# Patient Record
Sex: Female | Born: 1970 | Race: White | Hispanic: No | Marital: Single | Smoking: Current every day smoker
Health system: Southern US, Community
[De-identification: ages and names within clinical notes are randomized; demographics above are authoritative.]

## PROBLEM LIST (undated history)

## (undated) DIAGNOSIS — I10 Essential (primary) hypertension: Secondary | ICD-10-CM

## (undated) HISTORY — DX: Essential (primary) hypertension: I10

---

## 1992-07-17 HISTORY — PX: HAND SURGERY: SHX662

## 1996-07-17 HISTORY — PX: OTHER SURGICAL HISTORY: SHX169

## 2002-07-17 HISTORY — PX: CHOLECYSTECTOMY: SHX55

## 2002-07-17 HISTORY — PX: APPENDECTOMY: SHX54

## 2013-12-10 ENCOUNTER — Encounter: Payer: Self-pay | Admitting: Family Medicine

## 2013-12-10 ENCOUNTER — Ambulatory Visit (INDEPENDENT_AMBULATORY_CARE_PROVIDER_SITE_OTHER): Payer: Self-pay | Admitting: Family Medicine

## 2013-12-10 VITALS — BP 86/61 | HR 96 | Ht 67.0 in | Wt 255.0 lb

## 2013-12-10 DIAGNOSIS — F411 Generalized anxiety disorder: Secondary | ICD-10-CM

## 2013-12-10 DIAGNOSIS — F419 Anxiety disorder, unspecified: Secondary | ICD-10-CM

## 2013-12-10 DIAGNOSIS — F329 Major depressive disorder, single episode, unspecified: Secondary | ICD-10-CM

## 2013-12-10 DIAGNOSIS — G43909 Migraine, unspecified, not intractable, without status migrainosus: Secondary | ICD-10-CM

## 2013-12-10 DIAGNOSIS — Z124 Encounter for screening for malignant neoplasm of cervix: Secondary | ICD-10-CM

## 2013-12-10 DIAGNOSIS — Z13220 Encounter for screening for lipoid disorders: Secondary | ICD-10-CM

## 2013-12-10 DIAGNOSIS — F32A Depression, unspecified: Secondary | ICD-10-CM | POA: Insufficient documentation

## 2013-12-10 DIAGNOSIS — F3289 Other specified depressive episodes: Secondary | ICD-10-CM

## 2013-12-10 MED ORDER — CLONAZEPAM 1 MG PO TABS: 1.0000 mg | ORAL_TABLET | Freq: Three times a day (TID) | ORAL | Status: DC | PRN

## 2013-12-10 MED ORDER — BUSPIRONE HCL 10 MG PO TABS: 10.0000 mg | ORAL_TABLET | Freq: Two times a day (BID) | ORAL | Status: DC

## 2013-12-10 MED ORDER — METOPROLOL TARTRATE 50 MG PO TABS: 50.0000 mg | ORAL_TABLET | Freq: Two times a day (BID) | ORAL | Status: DC

## 2013-12-10 MED ORDER — FLUOXETINE HCL 20 MG PO TABS: 20.0000 mg | ORAL_TABLET | Freq: Every day | ORAL | Status: DC

## 2013-12-10 NOTE — Progress Notes (Signed)
CC: Cynthia Murray is a 43 y.o. female is here for Establish Care and medication refills   Subjective: HPI:   Pleasant 43 year old here to establish care  Reports a history of migraine stemming back years currently well controlled almost completely absent while taking metoprolol 50 mg twice a day. She denies orthostatic like symptoms and does not check blood pressure at home. Denies any recent or remote motor or sensory disturbances.  Unknown triggers for migraines.  Reports a history of depression that stems back decades. Symptoms significantly declined around the time of her mother's death a few years ago.  Most recently she's been prescribed Prozac which she is taking 40 mg in the morning and 20 mg in the evening. Since being on this regimen she reports occasional thoughts of pessimism, and subjective depression however overall symptoms are much better controlled in satisfactory her mind. She denies recent or remote paranoia, nor thoughts wanting to harm herself or others.  Reports a history of long-standing anxiety stemming back decades. She's been using a combination of BuSpar and clonazepam and symptoms were well-controlled until she ran out of clonazepam a few months ago. Medication regimen is limited by lack of insurance. Anxiety is worsened by family responsibilities, caring for her nieces, and when in crowds.  Symptoms have been severe enough where she   rarely feels comfortable leaving the house even for responsibilities such as picking up her family members.  She tells me she has a history of hyperlipidemia was one time on gemfibrozil however cannot afford the medications at a stop taking  She tells me her last Pap smear was over 15 years ago no history of abnormals.  Review of Systems - General ROS: negative for - chills, fever, night sweats, weight gain or weight loss Ophthalmic ROS: negative for - decreased vision Psychological ROS: negative for - hallucinations or paranoia ENT  ROS: negative for - hearing change, nasal congestion, tinnitus or allergies Hematological and Lymphatic ROS: negative for - bleeding problems, bruising or swollen lymph nodes Breast ROS: negative Respiratory ROS: no cough, shortness of breath, or wheezing Cardiovascular ROS: no chest pain or dyspnea on exertion Gastrointestinal ROS: no abdominal pain, change in bowel habits, or black or bloody stools Genito-Urinary ROS: negative for - genital discharge, genital ulcers, incontinence or abnormal bleeding from genitals Musculoskeletal ROS: negative for - joint pain or muscle pain Neurological ROS: negative for - headaches or memory loss Dermatological ROS: negative for lumps, mole changes, rash and skin lesion changes  Past Medical History  Diagnosis Date  . Hypertension     Past Surgical History  Procedure Laterality Date  . Hand surgery  1994    left hand  . Removal of cyst between lungs  1998  . Appendectomy  2004  . Cholecystectomy  2004   Family History  Problem Relation Age of Onset  . Breast cancer      grandmother  . Heart attack Father   . Depression Mother     father  . Diabetes Father   . Hyperlipidemia Father   . Hypertension Father     History   Social History  . Marital Status: Single    Spouse Name: N/A    Number of Children: N/A  . Years of Education: N/A   Occupational History  . Not on file.   Social History Main Topics  . Smoking status: Current Every Day Smoker -- 1.00 packs/day for 15 years    Types: Cigarettes  . Smokeless tobacco: Not on  file  . Alcohol Use: Yes  . Drug Use: No  . Sexual Activity: Not Currently   Other Topics Concern  . Not on file   Social History Narrative  . No narrative on file     Objective: BP 86/61  Pulse 96  Ht 5' 7"  (1.702 m)  Wt 255 lb (115.667 kg)  BMI 39.93 kg/m2  General: Alert and Oriented, No Acute Distress HEENT: Pupils equal, round, reactive to light. Conjunctivae clear.  Moist membranes there is  unremarkable Lungs: Clear to auscultation bilaterally, no wheezing/ronchi/rales.  Comfortable work of breathing. Good air movement. Cardiac: Regular rate and rhythm. Normal S1/S2.  No murmurs, rubs, nor gallops.  No carotid bruits Abdomen:  Obese Extremities: No peripheral edema.  Strong peripheral pulses.  Mental Status: No depression, anxiety, nor agitation. Skin: Warm and dry.  Assessment & Plan: Cynthia Murray was seen today for establish care and medication refills.  Diagnoses and associated orders for this visit:  Anxiety - busPIRone (BUSPAR) 10 MG tablet; Take 1 tablet (10 mg total) by mouth 2 (two) times daily. - clonazePAM (KLONOPIN) 1 MG tablet; Take 1 tablet (1 mg total) by mouth 3 (three) times daily as needed for anxiety.  Depression - FLUoxetine (PROZAC) 20 MG tablet; Take 1 tablet (20 mg total) by mouth daily. Take two in the morning and one in the evening  Migraine - metoprolol (LOPRESSOR) 50 MG tablet; Take 1 tablet (50 mg total) by mouth 2 (two) times daily.  Lipid screening  Screening for cervical cancer     anxiety: Uncontrolled continue BuSpar restart clonazepam Depression: Controlled continue Prozac Migraine: Controlled continue metoprolol Lipid screen: I've advised her to do routine lipid screening to check triglycerides and LDL, she politely declines Screen for cervical cancer: Counseled her that she is due for a Pap smear and we can do that in our office at her convenience encouraged her to schedule an appointment for this at her convenience  Return in about 6 months (around 06/12/2014).

## 2013-12-11 ENCOUNTER — Encounter: Payer: Self-pay | Admitting: Family Medicine

## 2013-12-11 DIAGNOSIS — I10 Essential (primary) hypertension: Secondary | ICD-10-CM | POA: Insufficient documentation

## 2013-12-11 DIAGNOSIS — G8929 Other chronic pain: Secondary | ICD-10-CM | POA: Insufficient documentation

## 2014-04-16 ENCOUNTER — Other Ambulatory Visit: Payer: Self-pay | Admitting: Family Medicine

## 2014-05-14 ENCOUNTER — Encounter: Payer: Self-pay | Admitting: Sports Medicine

## 2014-05-14 ENCOUNTER — Ambulatory Visit (INDEPENDENT_AMBULATORY_CARE_PROVIDER_SITE_OTHER): Payer: Self-pay | Admitting: Sports Medicine

## 2014-05-14 ENCOUNTER — Ambulatory Visit (INDEPENDENT_AMBULATORY_CARE_PROVIDER_SITE_OTHER): Payer: Self-pay

## 2014-05-14 VITALS — BP 106/61 | HR 90 | Wt 255.0 lb

## 2014-05-14 DIAGNOSIS — M25571 Pain in right ankle and joints of right foot: Secondary | ICD-10-CM

## 2014-05-14 DIAGNOSIS — F419 Anxiety disorder, unspecified: Secondary | ICD-10-CM

## 2014-05-14 DIAGNOSIS — M5416 Radiculopathy, lumbar region: Secondary | ICD-10-CM | POA: Insufficient documentation

## 2014-05-14 DIAGNOSIS — M25471 Effusion, right ankle: Secondary | ICD-10-CM

## 2014-05-14 NOTE — Progress Notes (Signed)
   Subjective:    I'm seeing this patient as a consultation for:  Dr. Ileene Rubens  CC: Multiple complaints  HPI: This is a 43 year old female with a history of anxiety and depression who comes in with a long history of pain in her low back, worse with flexion, Valsalva, sitting, radiating down the left leg imprecise in S1 distribution. She took some steroids already, and tells me she has been doing home rehabilitation exercises without any improvement moderate, persistent, no bowel or bladder dysfunction, saddle numbness, or new constitutional symptoms.  Right ankle pain: Occurred after an inversion injury, persistent pain, it is localized behind the medial malleolus, burning, radiates to the navicular.  Anxiety and depression: Allowed herself to run out of alprazolam, is requesting a refill from me.  Past medical history, Surgical history, Family history not pertinant except as noted below, Social history, Allergies, and medications have been entered into the medical record, reviewed, and no changes needed.   Review of Systems: No headache, visual changes, nausea, vomiting, diarrhea, constipation, dizziness, abdominal pain, skin rash, fevers, chills, night sweats, weight loss, swollen lymph nodes, body aches, joint swelling, muscle aches, chest pain, shortness of breath, mood changes, visual or auditory hallucinations.   Objective:   General: Well Developed, well nourished, and in no acute distress.  Neuro/Psych: Alert and oriented x3, extra-ocular muscles intact, able to move all 4 extremities, sensation grossly intact. Skin: Warm and dry, no rashes noted.  Respiratory: Not using accessory muscles, speaking in full sentences, trachea midline.  Cardiovascular: Pulses palpable, no extremity edema. Abdomen: Does not appear distended. Back Exam:  Inspection: Unremarkable  Motion: Flexion 45 deg, Extension 45 deg, Side Bending to 45 deg bilaterally,  Rotation to 45 deg bilaterally  SLR laying:  Negative  XSLR laying: Negative  Palpable tenderness: None. FABER: negative. Sensory change: Gross sensation intact to all lumbar and sacral dermatomes.  Reflexes: 2+ at both patellar tendons, 2+ at achilles tendons, Babinski's downgoing.  Strength at foot  Plantar-flexion: 5/5 Dorsi-flexion: 5/5 Eversion: 5/5 Inversion: 5/5  Leg strength  Quad: 5/5 Hamstring: 5/5 Hip flexor: 5/5 Hip abductors: 5/5  Gait unremarkable. Right Ankle: No visible erythema or swelling. Range of motion is full in all directions. Strength is 5/5 in all directions. Stable lateral and medial ligaments; squeeze test and kleiger test unremarkable; Talar dome nontender; No pain at base of 5th MT; No tenderness over cuboid; Tender to palpation behind the medial malleolus with recurrence of pain with resisted inversion of the foot, tender to palpation over the navicular. No sign of peroneal tendon subluxations; Negative tarsal tunnel tinel's Able to walk 4 steps.  Impression and Recommendations:   This case required medical decision making of moderate complexity.

## 2014-05-14 NOTE — Assessment & Plan Note (Signed)
Does see Dr. Selinda Orion with pain management. Has already been through steroids and home rehabilitation exercises, x-rays have been done in the past. MRI for interventional injection planning. Return to go over MRI results.

## 2014-05-14 NOTE — Assessment & Plan Note (Signed)
After an injury in the distant past. Stirrup brace. Formal physical therapy. X-rays. Return for custom orthotics. Encouraged use of shoes rather than walking barefoot.

## 2014-05-14 NOTE — Assessment & Plan Note (Signed)
Patient will need to follow-up with PCP regarding psychotropic medications.

## 2014-05-19 ENCOUNTER — Ambulatory Visit: Payer: Self-pay | Admitting: Family Medicine

## 2014-05-19 DIAGNOSIS — Z0289 Encounter for other administrative examinations: Secondary | ICD-10-CM

## 2014-05-26 ENCOUNTER — Other Ambulatory Visit: Payer: Self-pay | Admitting: Family Medicine

## 2014-06-25 ENCOUNTER — Ambulatory Visit: Payer: Self-pay | Admitting: Family Medicine

## 2014-06-26 ENCOUNTER — Telehealth: Payer: Self-pay | Admitting: *Deleted

## 2014-06-26 DIAGNOSIS — F32A Depression, unspecified: Secondary | ICD-10-CM

## 2014-06-26 DIAGNOSIS — F329 Major depressive disorder, single episode, unspecified: Secondary | ICD-10-CM

## 2014-06-26 MED ORDER — FLUOXETINE HCL 20 MG PO TABS: ORAL_TABLET | ORAL | Status: DC

## 2014-06-26 MED ORDER — BUSPIRONE HCL 10 MG PO TABS: ORAL_TABLET | ORAL | Status: DC

## 2014-06-26 MED ORDER — CLONAZEPAM 1 MG PO TABS: 1.0000 mg | ORAL_TABLET | Freq: Three times a day (TID) | ORAL | Status: DC | PRN

## 2014-06-26 NOTE — Telephone Encounter (Signed)
Pt called and states she had an appointment scheduled the other day but didn't show up because she states she hurt her back and couldn't walk. Pt asked if she could have refills on her medications for one more month. Will refill ONCE more but needs appt in the future.

## 2014-08-03 ENCOUNTER — Other Ambulatory Visit: Payer: Self-pay | Admitting: Family Medicine

## 2014-08-06 ENCOUNTER — Ambulatory Visit: Payer: Self-pay | Admitting: Family Medicine

## 2014-08-11 ENCOUNTER — Ambulatory Visit: Payer: Self-pay | Admitting: Family Medicine

## 2014-08-14 ENCOUNTER — Ambulatory Visit: Payer: Self-pay | Admitting: Family Medicine

## 2014-08-17 ENCOUNTER — Ambulatory Visit: Payer: Self-pay | Admitting: Family Medicine

## 2014-08-17 DIAGNOSIS — Z0289 Encounter for other administrative examinations: Secondary | ICD-10-CM

## 2014-08-25 ENCOUNTER — Encounter: Payer: Self-pay | Admitting: Family Medicine

## 2014-08-25 ENCOUNTER — Ambulatory Visit (INDEPENDENT_AMBULATORY_CARE_PROVIDER_SITE_OTHER): Payer: Self-pay | Admitting: Family Medicine

## 2014-08-25 VITALS — BP 117/78 | HR 89 | Wt 255.0 lb

## 2014-08-25 DIAGNOSIS — F32A Depression, unspecified: Secondary | ICD-10-CM

## 2014-08-25 DIAGNOSIS — F419 Anxiety disorder, unspecified: Secondary | ICD-10-CM

## 2014-08-25 DIAGNOSIS — G43809 Other migraine, not intractable, without status migrainosus: Secondary | ICD-10-CM

## 2014-08-25 DIAGNOSIS — I10 Essential (primary) hypertension: Secondary | ICD-10-CM

## 2014-08-25 DIAGNOSIS — F329 Major depressive disorder, single episode, unspecified: Secondary | ICD-10-CM

## 2014-08-25 MED ORDER — FLUOXETINE HCL 20 MG PO TABS: ORAL_TABLET | ORAL | Status: DC

## 2014-08-25 MED ORDER — METOPROLOL TARTRATE 25 MG PO TABS: 50.0000 mg | ORAL_TABLET | Freq: Two times a day (BID) | ORAL | Status: DC

## 2014-08-25 MED ORDER — CLONAZEPAM 1 MG PO TABS: 1.0000 mg | ORAL_TABLET | Freq: Three times a day (TID) | ORAL | Status: DC | PRN

## 2014-08-25 MED ORDER — BUSPIRONE HCL 10 MG PO TABS: ORAL_TABLET | ORAL | Status: DC

## 2014-08-25 NOTE — Progress Notes (Signed)
CC: Cynthia Murray is a 44 y.o. female is here for f/u meds   Subjective: HPI:   follow-up essential hypertension:  Ran out of metoprolol a little less than a month ago. No outside blood pressures to report. Denies chest pain shortness of breath orthopnea nor peripheral edema  Follow-up anxiety: Has run out of BuSpar and clonazepam. Continues to take Prozac 40 mg in the morning and 20 in the evenings. She's noticed worsening of her anxiety since running out of the medications above. Symptoms were well controlled prior to running out of the medication. Symptoms are bad enough to where she still extremely anxious about leaving her house. She denies any other nervousness.   follow-up depression:  She believes that subjective depression has been worsening since running out of BuSpar and clonazepam, she thinks maybe the anxiety is contributing to her depression. She denies thoughts  Of wanting to harm herself or others. She feels more gloomy than what she is used to.  No report of sleep disturbance or change in appetite.  Follow-up migraines: Since running out of  Toprol she's had a return of her right-sided migraines. She tells me that  Migraines were absent when taking half a dose of metoprolol twice a day. Denies any new motor or sensory disturbances  Review Of Systems Outlined In HPI  Past Medical History  Diagnosis Date  . Hypertension     Past Surgical History  Procedure Laterality Date  . Hand surgery  1994    left hand  . Removal of cyst between lungs  1998  . Appendectomy  2004  . Cholecystectomy  2004   Family History  Problem Relation Age of Onset  . Breast cancer      grandmother  . Heart attack Father   . Depression Mother     father  . Diabetes Father   . Hyperlipidemia Father   . Hypertension Father     History   Social History  . Marital Status: Single    Spouse Name: N/A    Number of Children: N/A  . Years of Education: N/A   Occupational History  . Not on  file.   Social History Main Topics  . Smoking status: Current Every Day Smoker -- 1.00 packs/day for 15 years    Types: Cigarettes  . Smokeless tobacco: Not on file  . Alcohol Use: Yes  . Drug Use: No  . Sexual Activity: Not Currently   Other Topics Concern  . Not on file   Social History Narrative     Objective: BP 117/78 mmHg  Pulse 89  Wt 255 lb (115.667 kg)  General: Alert and Oriented, No Acute Distress HEENT: Pupils equal, round, reactive to light. Conjunctivae clear.   Moist mucous membranes Lungs: Clear to auscultation bilaterally, no wheezing/ronchi/rales.  Comfortable work of breathing. Good air movement. Cardiac: Regular rate and rhythm. Normal S1/S2.  No murmurs, rubs, nor gallops.   Extremities: No peripheral edema.  Strong peripheral pulses.  Mental Status:  Mild depression and anxiety. No agitation. Logical thought process Skin: Warm and dry.  Assessment & Plan: Cynthia Murray was seen today for f/u meds.  Diagnoses and associated orders for this visit:  Essential hypertension, benign  Anxiety - busPIRone (BUSPAR) 10 MG tablet; Take 1 tablet by mouth twice daily - clonazePAM (KLONOPIN) 1 MG tablet; Take 1 tablet (1 mg total) by mouth 3 (three) times daily as needed. for anxiety  Depression - Discontinue: FLUoxetine (PROZAC) 20 MG tablet; Take two tablets in  the morning and one tablet in the evening - FLUoxetine (PROZAC) 20 MG tablet; Take two tablets in the morning and two tablet in the evening  Other migraine without status migrainosus, not intractable - metoprolol (LOPRESSOR) 25 MG tablet; Take 2 tablets (50 mg total) by mouth 2 (two) times daily.     essential hypertension: currently not on any antihypertensives as appears to be controlled, refilling metoprolol only for the below reasons  Anxiety:  Worsened due to running out of clonazepam and BuSpar, refilling former regimen  Depression: Worsening chronic condition, increasing Prozac  For her now to take   40 mg twice a day  Migraines: uncontrolled wall metoprolol restarted metoprolol but at a lower dose given her normotensive blood pressures today  Return in about 3 months (around 11/23/2014).

## 2014-12-04 ENCOUNTER — Ambulatory Visit (INDEPENDENT_AMBULATORY_CARE_PROVIDER_SITE_OTHER): Payer: Self-pay | Admitting: Family Medicine

## 2014-12-04 ENCOUNTER — Encounter: Payer: Self-pay | Admitting: Family Medicine

## 2014-12-04 VITALS — BP 121/83 | HR 105 | Temp 97.6°F | Wt 277.0 lb

## 2014-12-04 DIAGNOSIS — R05 Cough: Secondary | ICD-10-CM

## 2014-12-04 DIAGNOSIS — R059 Cough, unspecified: Secondary | ICD-10-CM

## 2014-12-04 DIAGNOSIS — F329 Major depressive disorder, single episode, unspecified: Secondary | ICD-10-CM

## 2014-12-04 DIAGNOSIS — F32A Depression, unspecified: Secondary | ICD-10-CM

## 2014-12-04 DIAGNOSIS — F419 Anxiety disorder, unspecified: Secondary | ICD-10-CM

## 2014-12-04 DIAGNOSIS — N959 Unspecified menopausal and perimenopausal disorder: Secondary | ICD-10-CM

## 2014-12-04 MED ORDER — AZITHROMYCIN 250 MG PO TABS: ORAL_TABLET | ORAL | Status: AC

## 2014-12-04 MED ORDER — VENLAFAXINE HCL ER 75 MG PO CP24: 75.0000 mg | ORAL_CAPSULE | Freq: Every day | ORAL | Status: DC

## 2014-12-04 MED ORDER — CLONAZEPAM 1 MG PO TABS: 1.0000 mg | ORAL_TABLET | Freq: Three times a day (TID) | ORAL | Status: DC | PRN

## 2014-12-04 NOTE — Progress Notes (Signed)
CC: Cynthia Murray is a 44 y.o. female is here for Cough   Subjective: HPI:  Follow-up anxiety: Requesting refills on clonazepam. Continues on Prozac and BuSpar daily. She feels anxiety is currently controlled on her as needed use of clonazepam that she is taking 2-3 times a day. She has some difficulty leaving the house due to anxiety but it does not interfere with quality of life. She feels like her depression is worsening and around the time of her menstrual cycle she is getting crying spells for no particular reason. She cries over things that she recognizes shouldn't bother her whatsoever. No thoughts of wanting harm herself or others. She is also noticed that periods are less predictable.  Complains of cough that's been present on a daily basis for the last month. She is tried all over-the-counter cough medications that she has found that a local pharmacy without much benefit. She was also on a prednisone taper for left knee pain which did not help with cough whatsoever. She is occasionally hearing herself wheeze overall symptoms are moderate in severity. Present all hours of the day. No fevers, chills, night sweats, postnasal drip nor nasal congestion. Denies shortness of breath. Review of systems positive for increased frequency of migraines.    Review Of Systems Outlined In HPI  Past Medical History  Diagnosis Date  . Hypertension     Past Surgical History  Procedure Laterality Date  . Hand surgery  1994    left hand  . Removal of cyst between lungs  1998  . Appendectomy  2004  . Cholecystectomy  2004   Family History  Problem Relation Age of Onset  . Breast cancer      grandmother  . Heart attack Father   . Depression Mother     father  . Diabetes Father   . Hyperlipidemia Father   . Hypertension Father     History   Social History  . Marital Status: Single    Spouse Name: N/A  . Number of Children: N/A  . Years of Education: N/A   Occupational History  . Not on  file.   Social History Main Topics  . Smoking status: Current Every Day Smoker -- 1.00 packs/day for 15 years    Types: Cigarettes  . Smokeless tobacco: Not on file  . Alcohol Use: Yes  . Drug Use: No  . Sexual Activity: Not Currently   Other Topics Concern  . Not on file   Social History Narrative     Objective: BP 121/83 mmHg  Pulse 105  Temp(Src) 97.6 F (36.4 C) (Oral)  Wt 277 lb (125.646 kg)  SpO2 98%  General: Alert and Oriented, No Acute Distress HEENT: Pupils equal, round, reactive to light. Conjunctivae clear.  Moist mucous membranes Lungs: Clear to auscultation bilaterally, no wheezing/ronchi/rales.  Comfortable work of breathing. Good air movement. Cardiac: Regular rate and rhythm. Extremities: No peripheral edema.  Strong peripheral pulses.  Mental Status: No depression, anxiety, nor agitation. Skin: Warm and dry.  Assessment & Plan: Ronit was seen today for cough.  Diagnoses and all orders for this visit:  Anxiety Orders: -     clonazePAM (KLONOPIN) 1 MG tablet; Take 1 tablet (1 mg total) by mouth 3 (three) times daily as needed. for anxiety  Depression  Premenopausal patient Orders: -     venlafaxine XR (EFFEXOR XR) 75 MG 24 hr capsule; Take 1 capsule (75 mg total) by mouth daily with breakfast.  Cough Orders: -  azithromycin (ZITHROMAX) 250 MG tablet; Take two tabs at once on day 1, then one tab daily on days 2-5.   Anxiety: Controlled continue clonazepam, BuSpar and Prozac Depression: Uncontrolled, it sounds it is possible that she is also experiencing some postmenopausal symptoms therefore start Effexor which should help with headaches as well Cough: Suspect bacterial bronchitis therefore start azithromycin  Return in about 3 months (around 03/06/2015).

## 2014-12-15 ENCOUNTER — Encounter: Payer: Self-pay | Admitting: Family Medicine

## 2014-12-15 ENCOUNTER — Ambulatory Visit (INDEPENDENT_AMBULATORY_CARE_PROVIDER_SITE_OTHER): Payer: Self-pay | Admitting: Family Medicine

## 2014-12-15 VITALS — BP 129/79 | HR 98 | Wt 282.0 lb

## 2014-12-15 DIAGNOSIS — B9689 Other specified bacterial agents as the cause of diseases classified elsewhere: Secondary | ICD-10-CM

## 2014-12-15 DIAGNOSIS — R609 Edema, unspecified: Secondary | ICD-10-CM

## 2014-12-15 DIAGNOSIS — A499 Bacterial infection, unspecified: Secondary | ICD-10-CM

## 2014-12-15 DIAGNOSIS — J329 Chronic sinusitis, unspecified: Secondary | ICD-10-CM

## 2014-12-15 DIAGNOSIS — R0602 Shortness of breath: Secondary | ICD-10-CM

## 2014-12-15 MED ORDER — AMOXICILLIN-POT CLAVULANATE 500-125 MG PO TABS: ORAL_TABLET | ORAL | Status: AC

## 2014-12-15 NOTE — Progress Notes (Signed)
CC: Cynthia Murray is a 44 y.o. female is here for Leg Swelling   Subjective: HPI:   bilateral lower leg swelling it has been present for the past 2 or 3 weeks. It is symmetrical, improves with elevation of the legs. She's also tried up to 40 mg of Lasix  Without any benefit. She didn't even feel like she had a urinate more  After taking this dose.  She's never had this before. It's painful on the skin and tender to the touch where swelling is worse. Localized only to the legs.  Symptoms are mild to moderate in severity. Denies orthopnea nor PND.   Continues to have a cough is not improved since I saw her last. Azithromycin was no help. It's accompanied now with shortness of breath with easy activities such as unloading the dishwasher. Describes cough is nonproductive without blood in sputum. No wheezing. No fevers, chills, night sweats. Review of systems positive for pressure in the forehead and cheeks along with nasal congestion   Review Of Systems Outlined In HPI  Past Medical History  Diagnosis Date  . Hypertension     Past Surgical History  Procedure Laterality Date  . Hand surgery  1994    left hand  . Removal of cyst between lungs  1998  . Appendectomy  2004  . Cholecystectomy  2004   Family History  Problem Relation Age of Onset  . Breast cancer      grandmother  . Heart attack Father   . Depression Mother     father  . Diabetes Father   . Hyperlipidemia Father   . Hypertension Father     History   Social History  . Marital Status: Single    Spouse Name: N/A  . Number of Children: N/A  . Years of Education: N/A   Occupational History  . Not on file.   Social History Main Topics  . Smoking status: Current Every Day Smoker -- 1.00 packs/day for 15 years    Types: Cigarettes  . Smokeless tobacco: Not on file  . Alcohol Use: Yes  . Drug Use: No  . Sexual Activity: Not Currently   Other Topics Concern  . Not on file   Social History Narrative      Objective: BP 129/79 mmHg  Pulse 98  Wt 282 lb (127.914 kg)  General: Alert and Oriented, No Acute Distress HEENT: Pupils equal, round, reactive to light. Conjunctivae clear.   Moist mucous membranes Lungs: Clear to auscultation bilaterally, no wheezing/ronchi/rales.  Comfortable work of breathing. Good air movement. Frequent coughing Cardiac: Regular rate and rhythm. Normal S1/S2.  No murmurs, rubs, nor gallops.   / Extremities:  Trace pitting edema in the ankles bilaterally.  Strong peripheral pulses.  Mental Status: No depression, anxiety, nor agitation. Skin: Warm and dry.  Assessment & Plan: Cynthia Murray was seen today for leg swelling.  Diagnoses and all orders for this visit:  Shortness of breath Orders: -     Basic Metabolic Panel (BMET) -     B Nat Peptide  Edema Orders: -     Basic Metabolic Panel (BMET) -     B Nat Peptide  Bacterial sinusitis Orders: -     amoxicillin-clavulanate (AUGMENTIN) 500-125 MG per tablet; Take one by mouth every 8 hours for ten total days.    shortness of breath:  Rule out CHF  With  Beta natruretic peptide  Edema: Rule out renal insufficiency and heart failure  Spectral sinusitis: Start Augmentin  Ultimate plan will be based on the above results  Return if symptoms worsen or fail to improve.

## 2014-12-16 ENCOUNTER — Telehealth: Payer: Self-pay | Admitting: Family Medicine

## 2014-12-16 LAB — BASIC METABOLIC PANEL
BUN: 14 mg/dL (ref 6–23)
CALCIUM: 9.3 mg/dL (ref 8.4–10.5)
CO2: 27 mEq/L (ref 19–32)
Chloride: 104 mEq/L (ref 96–112)
Creat: 0.56 mg/dL (ref 0.50–1.10)
Glucose, Bld: 106 mg/dL — ABNORMAL HIGH (ref 70–99)
Potassium: 4.2 mEq/L (ref 3.5–5.3)
SODIUM: 141 meq/L (ref 135–145)

## 2014-12-16 LAB — BRAIN NATRIURETIC PEPTIDE: BRAIN NATRIURETIC PEPTIDE: 46.4 pg/mL (ref 0.0–100.0)

## 2014-12-16 MED ORDER — HYDROCHLOROTHIAZIDE 25 MG PO TABS: ORAL_TABLET | ORAL | Status: DC

## 2014-12-16 NOTE — Telephone Encounter (Signed)
Seth Bake, Will you please let patient know that her kidney function and heart failure test were both normal.  I'd like to offer her hydrochlorothiazide that I've sent to her [facility] to use on a PRN basis for leg swelling.

## 2014-12-16 NOTE — Telephone Encounter (Signed)
Message left on vm

## 2014-12-17 ENCOUNTER — Telehealth: Payer: Self-pay | Admitting: *Deleted

## 2014-12-17 NOTE — Telephone Encounter (Signed)
Pt left a vm late yesterday evening wanting to know if she could come off of the 2L fluid restriction now that her labs were fine & you gave her HCTZ.  Please advise.

## 2014-12-17 NOTE — Telephone Encounter (Signed)
LMOM notifying pt to stop the 2L fluid restriction.

## 2014-12-17 NOTE — Telephone Encounter (Signed)
closed

## 2014-12-17 NOTE — Telephone Encounter (Signed)
Yes that is okay, she may now ignore the 2 L fluid restriction.

## 2015-02-09 ENCOUNTER — Other Ambulatory Visit: Payer: Self-pay | Admitting: *Deleted

## 2015-02-09 ENCOUNTER — Telehealth: Payer: Self-pay | Admitting: *Deleted

## 2015-02-09 DIAGNOSIS — F32A Depression, unspecified: Secondary | ICD-10-CM

## 2015-02-09 DIAGNOSIS — F329 Major depressive disorder, single episode, unspecified: Secondary | ICD-10-CM

## 2015-02-09 MED ORDER — FLUOXETINE HCL 20 MG PO CAPS: ORAL_CAPSULE | ORAL | Status: DC

## 2015-02-09 NOTE — Telephone Encounter (Signed)
Pt left vm stating that her fluoxetine is considerably cheaper in capsule form than tablet.  I spoke with her pharmacy and they require a new rx be sent over stating capsules so they can dispense those at her next fill.  Is it ok for me to send this over? Please advise.

## 2015-02-09 NOTE — Telephone Encounter (Signed)
Done

## 2015-02-09 NOTE — Telephone Encounter (Signed)
Amber, Yes, can you please help with that change?

## 2015-02-10 ENCOUNTER — Encounter: Payer: Self-pay | Admitting: Family Medicine

## 2015-02-10 ENCOUNTER — Ambulatory Visit (INDEPENDENT_AMBULATORY_CARE_PROVIDER_SITE_OTHER): Payer: Self-pay

## 2015-02-10 ENCOUNTER — Ambulatory Visit (INDEPENDENT_AMBULATORY_CARE_PROVIDER_SITE_OTHER): Payer: Self-pay | Admitting: Family Medicine

## 2015-02-10 VITALS — BP 113/81 | HR 89 | Temp 98.4°F | Wt 266.0 lb

## 2015-02-10 DIAGNOSIS — R042 Hemoptysis: Secondary | ICD-10-CM

## 2015-02-10 DIAGNOSIS — R918 Other nonspecific abnormal finding of lung field: Secondary | ICD-10-CM

## 2015-02-10 MED ORDER — HYDROCODONE-HOMATROPINE 5-1.5 MG/5ML PO SYRP
5.0000 mL | ORAL_SOLUTION | Freq: Three times a day (TID) | ORAL | Status: DC | PRN
Start: 2015-02-10 — End: 2015-04-05

## 2015-02-10 MED ORDER — AZITHROMYCIN 250 MG PO TABS: ORAL_TABLET | ORAL | Status: AC

## 2015-02-10 NOTE — Progress Notes (Signed)
CC: Cynthia Murray is a 44 y.o. female is here for Cough   Subjective: HPI:  Complains of cough and shortness of breath. Symptoms have been present ever since she left the hospital for a microdiscectomy at the beginning of July. Symptoms are worse in the evenings. Cough is productive with pink sputum. She's been trying to suppress this with NyQuil without much benefit.  She feels fatigued. She denies fevers, chills, wheezing, or chest discomfort. She denies unintentional weight loss or known tuberculosis exposure. Symptoms overall are moderate in severity. She denies nasal congestion, postnasal drip or sore throat.    Review Of Systems Outlined In HPI  Past Medical History  Diagnosis Date  . Hypertension     Past Surgical History  Procedure Laterality Date  . Hand surgery  1994    left hand  . Removal of cyst between lungs  1998  . Appendectomy  2004  . Cholecystectomy  2004   Family History  Problem Relation Age of Onset  . Breast cancer      grandmother  . Heart attack Father   . Depression Mother     father  . Diabetes Father   . Hyperlipidemia Father   . Hypertension Father     History   Social History  . Marital Status: Single    Spouse Name: N/A  . Number of Children: N/A  . Years of Education: N/A   Occupational History  . Not on file.   Social History Main Topics  . Smoking status: Current Every Day Smoker -- 1.00 packs/day for 15 years    Types: Cigarettes  . Smokeless tobacco: Not on file  . Alcohol Use: Yes  . Drug Use: No  . Sexual Activity: Not Currently   Other Topics Concern  . Not on file   Social History Narrative     Objective: BP 113/81 mmHg  Pulse 89  Temp(Src) 98.4 F (36.9 C) (Oral)  Wt 266 lb (120.657 kg)  SpO2 93%  General: Alert and Oriented, No Acute Distress HEENT: Pupils equal, round, reactive to light. Conjunctivae clear.  Moist mucous membranes, pharynx Unremarkable Lungs: Comfortable work of breathing, speaking in  full sentences. No wheezing or rales. In the left upper posterior lung field. She coughed up sputum while we were talking that look purulent and had red streaks in it. Cardiac: Regular rate and rhythm. Normal S1/S2.  No murmurs, rubs, nor gallops.   Extremities: No peripheral edema.  Strong peripheral pulses.  Mental Status: No depression, anxiety, nor agitation. Skin: Warm and dry.  Assessment & Plan: Cynthia Murray was seen today for cough.  Diagnoses and all orders for this visit:  Hemoptysis Orders: -     HYDROcodone-homatropine (HYCODAN) 5-1.5 MG/5ML syrup; Take 5 mLs by mouth every 8 (eight) hours as needed for cough. -     azithromycin (ZITHROMAX) 250 MG tablet; Take two tabs at once on day 1, then one tab daily on days 2-5. -     DG Chest 2 View; Future   There is a great deal of noncontributory history provided by the patient regarding family dynamics at home, I had to remind her multiple times that she has to be scheduled for evaluation of cough.  Patient required frequent redirection especially once I noticed that her sputum had red streaks that look like blood in it. Start azithromycin for suspicion of walking pneumonia, check an x-ray to determine if she also needs to be on a beta-lactam antibiotic into rule out lung in  this heavy smoker.  Offer Tessalon Perles due to her request for something to help suppress the cough, she tells me that she was given this in the hospital and they were completely ineffective.  25 minutes spent face-to-face during visit today of which at least 50% was counseling or coordinating care regarding: 1. Hemoptysis    Ultimate plan will be based on the above results   Return if symptoms worsen or fail to improve.

## 2015-02-11 ENCOUNTER — Telehealth: Payer: Self-pay | Admitting: Family Medicine

## 2015-02-11 DIAGNOSIS — R918 Other nonspecific abnormal finding of lung field: Secondary | ICD-10-CM | POA: Insufficient documentation

## 2015-02-11 NOTE — Telephone Encounter (Signed)
Cynthia Murray, Will you please let patient know that her xray showed a shadow in her left lung that needs to be further evaluated with a CT scan of the chest to determine if it's pneumonia versus something more serious.  An order has been placed for this, please contact me tomorrow if she has not been contacted about scheduling by then.  Continue with taking azithromycin for now.

## 2015-02-11 NOTE — Telephone Encounter (Signed)
Complete message left on vm

## 2015-02-12 ENCOUNTER — Other Ambulatory Visit: Payer: Self-pay

## 2015-02-12 ENCOUNTER — Ambulatory Visit (INDEPENDENT_AMBULATORY_CARE_PROVIDER_SITE_OTHER): Payer: Self-pay

## 2015-02-12 ENCOUNTER — Telehealth: Payer: Self-pay | Admitting: Family Medicine

## 2015-02-12 DIAGNOSIS — R918 Other nonspecific abnormal finding of lung field: Secondary | ICD-10-CM

## 2015-02-12 MED ORDER — AMOXICILLIN 500 MG PO CAPS: 1000.0000 mg | ORAL_CAPSULE | Freq: Three times a day (TID) | ORAL | Status: DC

## 2015-02-12 MED ORDER — IOHEXOL 300 MG/ML  SOLN
75.0000 mL | Freq: Once | INTRAMUSCULAR | Status: AC | PRN
  Administered 2015-02-12: 75 mL via INTRAVENOUS

## 2015-02-12 NOTE — Telephone Encounter (Signed)
Pt.notified

## 2015-02-12 NOTE — Telephone Encounter (Signed)
Seth Bake, Will you please let patient know that the radiologist looking at her CT scan reports that her mass could possibly be just pneumonia therefore to fully cover her from an antibiotic standpoint I'd recommend she continue taking azithromycin and start a high dose regimen of amoxicillin that I've sent to her [facility].  I'd recommend a repeat CT scan in 6-8 weeks.

## 2015-03-09 ENCOUNTER — Other Ambulatory Visit: Payer: Self-pay | Admitting: Family Medicine

## 2015-03-12 ENCOUNTER — Other Ambulatory Visit: Payer: Self-pay | Admitting: *Deleted

## 2015-03-12 DIAGNOSIS — F419 Anxiety disorder, unspecified: Secondary | ICD-10-CM

## 2015-03-12 MED ORDER — CLONAZEPAM 1 MG PO TABS: 1.0000 mg | ORAL_TABLET | Freq: Three times a day (TID) | ORAL | Status: DC | PRN

## 2015-04-05 ENCOUNTER — Ambulatory Visit (INDEPENDENT_AMBULATORY_CARE_PROVIDER_SITE_OTHER): Payer: Self-pay | Admitting: Family Medicine

## 2015-04-05 ENCOUNTER — Ambulatory Visit: Payer: Self-pay

## 2015-04-05 ENCOUNTER — Other Ambulatory Visit: Payer: Self-pay | Admitting: Family Medicine

## 2015-04-05 ENCOUNTER — Encounter: Payer: Self-pay | Admitting: Family Medicine

## 2015-04-05 VITALS — BP 112/56 | HR 90 | Wt 263.0 lb

## 2015-04-05 DIAGNOSIS — M5418 Radiculopathy, sacral and sacrococcygeal region: Secondary | ICD-10-CM

## 2015-04-05 DIAGNOSIS — Z3049 Encounter for surveillance of other contraceptives: Secondary | ICD-10-CM

## 2015-04-05 DIAGNOSIS — F419 Anxiety disorder, unspecified: Secondary | ICD-10-CM

## 2015-04-05 DIAGNOSIS — R918 Other nonspecific abnormal finding of lung field: Secondary | ICD-10-CM

## 2015-04-05 DIAGNOSIS — G43809 Other migraine, not intractable, without status migrainosus: Secondary | ICD-10-CM

## 2015-04-05 MED ORDER — FLUOXETINE HCL 20 MG PO CAPS
ORAL_CAPSULE | ORAL | Status: DC
Start: 2015-04-05 — End: 2015-06-03

## 2015-04-05 MED ORDER — CLONAZEPAM 1 MG PO TABS
1.0000 mg | ORAL_TABLET | Freq: Three times a day (TID) | ORAL | Status: DC | PRN
Start: 2015-04-05 — End: 2015-06-22

## 2015-04-05 MED ORDER — METOPROLOL TARTRATE 25 MG PO TABS: 50.0000 mg | ORAL_TABLET | Freq: Two times a day (BID) | ORAL | Status: DC

## 2015-04-05 MED ORDER — BUSPIRONE HCL 10 MG PO TABS: ORAL_TABLET | ORAL | Status: DC

## 2015-04-05 MED ORDER — TRAMADOL HCL 50 MG PO TABS: 50.0000 mg | ORAL_TABLET | Freq: Four times a day (QID) | ORAL | Status: DC | PRN

## 2015-04-05 MED ORDER — BUSPIRONE HCL 10 MG PO TABS
ORAL_TABLET | ORAL | Status: DC
Start: 2015-04-05 — End: 2015-04-05

## 2015-04-05 MED ORDER — MEDROXYPROGESTERONE ACETATE 150 MG/ML IM SUSP
150.0000 mg | Freq: Once | INTRAMUSCULAR | Status: AC
  Administered 2015-04-05: 150 mg via INTRAMUSCULAR

## 2015-04-05 MED ORDER — HYDROCHLOROTHIAZIDE 25 MG PO TABS: ORAL_TABLET | ORAL | Status: DC

## 2015-04-05 NOTE — Progress Notes (Signed)
CC: Cynthia Murray is a 44 y.o. female is here for Hypertension and Anxiety   Subjective: HPI:   Follow-up  Anxiety: Requesting refills on clonazepam. She tells me that she thinks BuSpar and Prozacare helping reduce anxiety attacks but she still needs at least one clonazepam a day. Symptoms are worse when trying to leave the house. Symptoms are worse in unfamiliar situations.  Follow-up lung mass: earlier this summer she was found to have a opacification in her left lower lung lobe. She tells me she felt extremely better after 2 days of the antibiotic and no longer has any blood in her sputum. She denies any chest pain or shortness of breath. She does have a daily cough and produces clear mucus, she continues to smoke half a pack to a full pack of cigarettes daily. She isnot fully ready to quit.  She was provided tramadol by her neurosurgery team after recent decompression of the left S1 nerve root. She tells me helps greatly with reducing pain in the bottom of her left lateral foot. She would like to know if she can have refills of this. She is no longer taking any other narcotics. Denies any weakness.  Requesting refills Of metoprolol. She takes this at least once a day. On the days that she forgets to take a second dose she'll have a headache. Symptoms are absent and preventative takes it on a daily basis twice a day.  She tells that she would like to take on some form of contraception other than a condom. She is not interested in birth control pills because she has some form of intolerance that she tells me is hard to describe. She denies any sensitivity or intolerance to progesterone. She is use the Depo-Provera shot in the past without side effects and it worked with respect to contraception. She tells me the last time she had sex was over 20 years ago. Her periods are regular and predictable.  Review Of Systems Outlined In HPI  Past Medical History  Diagnosis Date  . Hypertension     Past  Surgical History  Procedure Laterality Date  . Hand surgery  1994    left hand  . Removal of cyst between lungs  1998  . Appendectomy  2004  . Cholecystectomy  2004   Family History  Problem Relation Age of Onset  . Breast cancer      grandmother  . Heart attack Father   . Depression Mother     father  . Diabetes Father   . Hyperlipidemia Father   . Hypertension Father     Social History   Social History  . Marital Status: Single    Spouse Name: N/A  . Number of Children: N/A  . Years of Education: N/A   Occupational History  . Not on file.   Social History Main Topics  . Smoking status: Current Every Day Smoker -- 1.00 packs/day for 15 years    Types: Cigarettes  . Smokeless tobacco: Not on file  . Alcohol Use: Yes  . Drug Use: No  . Sexual Activity: Not Currently   Other Topics Concern  . Not on file   Social History Narrative     Objective: BP 112/56 mmHg  Pulse 90  Wt 263 lb (119.296 kg)  General: Alert and Oriented, No Acute Distress HEENT: Pupils equal, round, reactive to light. Conjunctivae clear.  Moist mucous membranes Lungs: Clear to auscultation bilaterally, no wheezing/ronchi/rales.  Comfortable work of breathing. Good air movement. Cardiac:  Regular rate and rhythm. Normal S1/S2.  No murmurs, rubs, nor gallops.   Abdomen: Normal bowel sounds, soft and non tender without palpable masses. Extremities: No peripheral edema.  Strong peripheral pulses.  Mental Status: No depression, anxiety, nor agitation. Skin: Warm and dry.  Assessment & Plan: Cynthia Murray was seen today for hypertension and anxiety.  Diagnoses and all orders for this visit:  Anxiety -     clonazePAM (KLONOPIN) 1 MG tablet; Take 1 tablet (1 mg total) by mouth 3 (three) times daily as needed. for anxiety -     busPIRone (BUSPAR) 10 MG tablet; Take 1 tablet by mouth twice daily  Lung mass  Left sacral radiculopathy -     traMADol (ULTRAM) 50 MG tablet; Take 1 tablet (50 mg total)  by mouth every 6 (six) hours as needed.  Encounter for surveillance of other contraceptive -     medroxyPROGESTERone (DEPO-PROVERA) injection 150 mg; Inject 1 mL (150 mg total) into the muscle once.  Other migraine without status migrainosus, not intractable -     metoprolol tartrate (LOPRESSOR) 25 MG tablet; Take 2 tablets (50 mg total) by mouth 2 (two) times daily.  Other orders -     FLUoxetine (PROZAC) 20 MG capsule; Take two tablets in the morning and two tablet in the evening   Anxiety: Controlled continue clonazepam, BuSpar, Prozac Lung mass: Urged to get a repeat CT scan to look for the possibility of this being cancer, she declines for financial reasons. Left sacral radiculopathy: Controlled with tramadol, refills provided Contraception: Discussed risks and benefits of Depo-Provera. First dose today, return in 12 weeks Migraines: Controlled with metoprolol  Flu shot was declined.  Return for 12 week depo shot.

## 2015-04-06 ENCOUNTER — Encounter: Payer: Self-pay | Admitting: Family Medicine

## 2015-04-06 NOTE — Progress Notes (Signed)
Patient rescheduled

## 2015-06-03 ENCOUNTER — Other Ambulatory Visit: Payer: Self-pay | Admitting: Family Medicine

## 2015-06-22 ENCOUNTER — Encounter: Payer: Self-pay | Admitting: Family Medicine

## 2015-06-22 ENCOUNTER — Ambulatory Visit (INDEPENDENT_AMBULATORY_CARE_PROVIDER_SITE_OTHER): Payer: Self-pay | Admitting: Family Medicine

## 2015-06-22 VITALS — BP 122/78 | HR 97 | Wt 263.0 lb

## 2015-06-22 DIAGNOSIS — G43809 Other migraine, not intractable, without status migrainosus: Secondary | ICD-10-CM

## 2015-06-22 DIAGNOSIS — F419 Anxiety disorder, unspecified: Secondary | ICD-10-CM

## 2015-06-22 DIAGNOSIS — I10 Essential (primary) hypertension: Secondary | ICD-10-CM

## 2015-06-22 DIAGNOSIS — M5418 Radiculopathy, sacral and sacrococcygeal region: Secondary | ICD-10-CM

## 2015-06-22 DIAGNOSIS — M5416 Radiculopathy, lumbar region: Secondary | ICD-10-CM

## 2015-06-22 DIAGNOSIS — Z308 Encounter for other contraceptive management: Secondary | ICD-10-CM

## 2015-06-22 MED ORDER — MELOXICAM 15 MG PO TABS: 15.0000 mg | ORAL_TABLET | Freq: Every day | ORAL | Status: DC

## 2015-06-22 MED ORDER — CLONAZEPAM 1 MG PO TABS: 1.0000 mg | ORAL_TABLET | Freq: Three times a day (TID) | ORAL | Status: DC | PRN

## 2015-06-22 MED ORDER — MEDROXYPROGESTERONE ACETATE 150 MG/ML IM SUSP
150.0000 mg | Freq: Once | INTRAMUSCULAR | Status: AC
  Administered 2015-06-22: 150 mg via INTRAMUSCULAR

## 2015-06-22 MED ORDER — METOPROLOL TARTRATE 25 MG PO TABS: 50.0000 mg | ORAL_TABLET | Freq: Two times a day (BID) | ORAL | Status: DC

## 2015-06-22 MED ORDER — TRAMADOL HCL 50 MG PO TABS: 50.0000 mg | ORAL_TABLET | Freq: Four times a day (QID) | ORAL | Status: DC | PRN

## 2015-06-22 NOTE — Progress Notes (Signed)
CC: Cynthia Murray is a 44 y.o. female is here for Medication Refill and Contraception   Subjective: HPI:  Follow-up contraception: She is quite happy with Depo-Provera. The time of month when she is usually going to have her. She will have a couple days of spotting. She denies any breakthrough bleeding or irregular bleeding.  Follow-up anxiety: She recently moved in with her significant other's uncle, she and her significant other are taking care of this individual. This is caused her anxiety to be persistent however if she takes clonazepam 3 times a day symptoms are manageable and not noticed by others. She denies any depression or any other mental disturbance. Follow-up essential hypertension: Continues to take metoprolol twice a day. Outside blood pressures to report. No chest pain shortness of breath orthopnea or peripheral edema.  Follow-up left lumbar radiculopathy: She's noticed that her tramadol is not helping much alone now that she is more physically active with transferring and caring for her significant other's uncle. Ends are low in the left side of the back and radiated down the left leg to a mild-to-moderate degree when lifting heavy objects. She denies any new motor or sensory disturbances in the appendages.    Review Of Systems Outlined In HPI  Past Medical History  Diagnosis Date  . Hypertension     Past Surgical History  Procedure Laterality Date  . Hand surgery  1994    left hand  . Removal of cyst between lungs  1998  . Appendectomy  2004  . Cholecystectomy  2004   Family History  Problem Relation Age of Onset  . Breast cancer      grandmother  . Heart attack Father   . Depression Mother     father  . Diabetes Father   . Hyperlipidemia Father   . Hypertension Father     Social History   Social History  . Marital Status: Single    Spouse Name: N/A  . Number of Children: N/A  . Years of Education: N/A   Occupational History  . Not on file.    Social History Main Topics  . Smoking status: Current Every Day Smoker -- 1.00 packs/day for 15 years    Types: Cigarettes  . Smokeless tobacco: Not on file  . Alcohol Use: Yes  . Drug Use: No  . Sexual Activity: Not Currently   Other Topics Concern  . Not on file   Social History Narrative     Objective: BP 122/78 mmHg  Pulse 97  Wt 263 lb (119.296 kg)  General: Alert and Oriented, No Acute Distress HEENT: Pupils equal, round, reactive to light. Conjunctivae clear. Moist mucous membranes  Lungs: Clear to auscultation bilaterally, no wheezing/ronchi/rales.  Comfortable work of breathing. Good air movement. Cardiac: Regular rate and rhythm. Normal S1/S2.  No murmurs, rubs, nor gallops.   Extremities: No peripheral edema.  Strong peripheral pulses.  Mental Status: No depression, anxiety, nor agitation. Skin: Warm and dry.  Assessment & Plan: Mystie was seen today for medication refill and contraception.  Diagnoses and all orders for this visit:  Encounter for other contraceptive management -     medroxyPROGESTERone (DEPO-PROVERA) injection 150 mg; Inject 1 mL (150 mg total) into the muscle once.  Anxiety -     clonazePAM (KLONOPIN) 1 MG tablet; Take 1 tablet (1 mg total) by mouth 3 (three) times daily as needed. for anxiety  Essential hypertension, benign  Left lumbar radiculopathy -     meloxicam (MOBIC) 15 MG tablet;  Take 1 tablet (15 mg total) by mouth daily. For back.  Left sacral radiculopathy -     traMADol (ULTRAM) 50 MG tablet; Take 1 tablet (50 mg total) by mouth every 6 (six) hours as needed.  Other migraine without status migrainosus, not intractable -     metoprolol tartrate (LOPRESSOR) 25 MG tablet; Take 2 tablets (50 mg total) by mouth 2 (two) times daily.   Contraceptive management: Controlled, second DepoProvera shot today, follow-up 3 months  Anxiety: Controlled with clonazepam   essential hypertension: Controlled with metoprolol which is also  helping reduce her migraines Left lumbar radiculopathy: Slightly worsening, continue tramadol at a meloxicam  She declines flu shot  Return in about 3 months (around 09/20/2015) for Blood Pressure.

## 2015-07-17 ENCOUNTER — Inpatient Hospital Stay (HOSPITAL_COMMUNITY)
Admission: AD | Admit: 2015-07-17 | Discharge: 2015-08-09 | DRG: 870 | Disposition: A | Discharge status: Rehabilitation Facility | Payer: Self-pay | Source: Other Acute Inpatient Hospital | Attending: Internal Medicine | Admitting: Internal Medicine

## 2015-07-17 ENCOUNTER — Inpatient Hospital Stay (HOSPITAL_COMMUNITY): Payer: Self-pay

## 2015-07-17 DIAGNOSIS — G9340 Encephalopathy, unspecified: Secondary | ICD-10-CM | POA: Diagnosis present

## 2015-07-17 DIAGNOSIS — R21 Rash and other nonspecific skin eruption: Secondary | ICD-10-CM | POA: Diagnosis not present

## 2015-07-17 DIAGNOSIS — D6489 Other specified anemias: Secondary | ICD-10-CM | POA: Diagnosis present

## 2015-07-17 DIAGNOSIS — E781 Pure hyperglyceridemia: Secondary | ICD-10-CM | POA: Diagnosis not present

## 2015-07-17 DIAGNOSIS — R410 Disorientation, unspecified: Secondary | ICD-10-CM | POA: Diagnosis present

## 2015-07-17 DIAGNOSIS — J810 Acute pulmonary edema: Secondary | ICD-10-CM | POA: Diagnosis not present

## 2015-07-17 DIAGNOSIS — J189 Pneumonia, unspecified organism: Secondary | ICD-10-CM

## 2015-07-17 DIAGNOSIS — R6521 Severe sepsis with septic shock: Secondary | ICD-10-CM | POA: Diagnosis present

## 2015-07-17 DIAGNOSIS — G92 Toxic encephalopathy: Secondary | ICD-10-CM | POA: Diagnosis not present

## 2015-07-17 DIAGNOSIS — D72829 Elevated white blood cell count, unspecified: Secondary | ICD-10-CM | POA: Diagnosis present

## 2015-07-17 DIAGNOSIS — R51 Headache: Secondary | ICD-10-CM

## 2015-07-17 DIAGNOSIS — G43809 Other migraine, not intractable, without status migrainosus: Secondary | ICD-10-CM

## 2015-07-17 DIAGNOSIS — J13 Pneumonia due to Streptococcus pneumoniae: Secondary | ICD-10-CM | POA: Diagnosis present

## 2015-07-17 DIAGNOSIS — E870 Hyperosmolality and hypernatremia: Secondary | ICD-10-CM | POA: Diagnosis not present

## 2015-07-17 DIAGNOSIS — F339 Major depressive disorder, recurrent, unspecified: Secondary | ICD-10-CM | POA: Diagnosis present

## 2015-07-17 DIAGNOSIS — I272 Other secondary pulmonary hypertension: Secondary | ICD-10-CM | POA: Diagnosis present

## 2015-07-17 DIAGNOSIS — F410 Panic disorder [episodic paroxysmal anxiety] without agoraphobia: Secondary | ICD-10-CM | POA: Diagnosis present

## 2015-07-17 DIAGNOSIS — T380X5A Adverse effect of glucocorticoids and synthetic analogues, initial encounter: Secondary | ICD-10-CM | POA: Diagnosis not present

## 2015-07-17 DIAGNOSIS — F17210 Nicotine dependence, cigarettes, uncomplicated: Secondary | ICD-10-CM | POA: Diagnosis present

## 2015-07-17 DIAGNOSIS — R4189 Other symptoms and signs involving cognitive functions and awareness: Secondary | ICD-10-CM | POA: Diagnosis present

## 2015-07-17 DIAGNOSIS — J90 Pleural effusion, not elsewhere classified: Secondary | ICD-10-CM | POA: Diagnosis not present

## 2015-07-17 DIAGNOSIS — Z7189 Other specified counseling: Secondary | ICD-10-CM

## 2015-07-17 DIAGNOSIS — J181 Lobar pneumonia, unspecified organism: Secondary | ICD-10-CM

## 2015-07-17 DIAGNOSIS — M549 Dorsalgia, unspecified: Secondary | ICD-10-CM | POA: Diagnosis present

## 2015-07-17 DIAGNOSIS — J9601 Acute respiratory failure with hypoxia: Secondary | ICD-10-CM

## 2015-07-17 DIAGNOSIS — J9690 Respiratory failure, unspecified, unspecified whether with hypoxia or hypercapnia: Secondary | ICD-10-CM

## 2015-07-17 DIAGNOSIS — N179 Acute kidney failure, unspecified: Secondary | ICD-10-CM

## 2015-07-17 DIAGNOSIS — R5381 Other malaise: Secondary | ICD-10-CM | POA: Diagnosis not present

## 2015-07-17 DIAGNOSIS — G894 Chronic pain syndrome: Secondary | ICD-10-CM | POA: Diagnosis present

## 2015-07-17 DIAGNOSIS — I10 Essential (primary) hypertension: Secondary | ICD-10-CM | POA: Diagnosis present

## 2015-07-17 DIAGNOSIS — J80 Acute respiratory distress syndrome: Secondary | ICD-10-CM | POA: Diagnosis present

## 2015-07-17 DIAGNOSIS — E876 Hypokalemia: Secondary | ICD-10-CM | POA: Diagnosis not present

## 2015-07-17 DIAGNOSIS — I639 Cerebral infarction, unspecified: Secondary | ICD-10-CM

## 2015-07-17 DIAGNOSIS — Z79899 Other long term (current) drug therapy: Secondary | ICD-10-CM

## 2015-07-17 DIAGNOSIS — Z789 Other specified health status: Secondary | ICD-10-CM

## 2015-07-17 DIAGNOSIS — G7281 Critical illness myopathy: Secondary | ICD-10-CM | POA: Diagnosis not present

## 2015-07-17 DIAGNOSIS — F411 Generalized anxiety disorder: Secondary | ICD-10-CM | POA: Diagnosis present

## 2015-07-17 DIAGNOSIS — Z6841 Body Mass Index (BMI) 40.0 and over, adult: Secondary | ICD-10-CM

## 2015-07-17 DIAGNOSIS — I4581 Long QT syndrome: Secondary | ICD-10-CM | POA: Diagnosis not present

## 2015-07-17 DIAGNOSIS — Z978 Presence of other specified devices: Secondary | ICD-10-CM | POA: Diagnosis present

## 2015-07-17 DIAGNOSIS — G43909 Migraine, unspecified, not intractable, without status migrainosus: Secondary | ICD-10-CM | POA: Diagnosis present

## 2015-07-17 DIAGNOSIS — R519 Headache, unspecified: Secondary | ICD-10-CM | POA: Diagnosis present

## 2015-07-17 DIAGNOSIS — Z4659 Encounter for fitting and adjustment of other gastrointestinal appliance and device: Secondary | ICD-10-CM

## 2015-07-17 DIAGNOSIS — E872 Acidosis: Secondary | ICD-10-CM | POA: Diagnosis present

## 2015-07-17 DIAGNOSIS — A419 Sepsis, unspecified organism: Principal | ICD-10-CM

## 2015-07-17 LAB — COMPREHENSIVE METABOLIC PANEL
ALT: 15 U/L (ref 14–54)
ANION GAP: 10 (ref 5–15)
AST: 25 U/L (ref 15–41)
Albumin: 2.5 g/dL — ABNORMAL LOW (ref 3.5–5.0)
Alkaline Phosphatase: 75 U/L (ref 38–126)
BUN: 25 mg/dL — ABNORMAL HIGH (ref 6–20)
CHLORIDE: 109 mmol/L (ref 101–111)
CO2: 17 mmol/L — AB (ref 22–32)
Calcium: 7 mg/dL — ABNORMAL LOW (ref 8.9–10.3)
Creatinine, Ser: 1.34 mg/dL — ABNORMAL HIGH (ref 0.44–1.00)
GFR calc non Af Amer: 48 mL/min — ABNORMAL LOW (ref >60)
GFR, EST AFRICAN AMERICAN: 56 mL/min — AB (ref >60)
Glucose, Bld: 119 mg/dL — ABNORMAL HIGH (ref 65–99)
Potassium: 4.3 mmol/L (ref 3.5–5.1)
SODIUM: 136 mmol/L (ref 135–145)
Total Bilirubin: 0.2 mg/dL — ABNORMAL LOW (ref 0.3–1.2)
Total Protein: 5.4 g/dL — ABNORMAL LOW (ref 6.5–8.1)

## 2015-07-17 LAB — POCT I-STAT 3, ART BLOOD GAS (G3+)
Acid-base deficit: 10 mmol/L — ABNORMAL HIGH (ref 0.0–2.0)
Acid-base deficit: 13 mmol/L — ABNORMAL HIGH (ref 0.0–2.0)
Acid-base deficit: 12 mmol/L — ABNORMAL HIGH (ref 0.0–2.0)
BICARBONATE: 15.4 meq/L — AB (ref 20.0–24.0)
BICARBONATE: 14.8 meq/L — AB (ref 20.0–24.0)
Bicarbonate: 15.8 mEq/L — ABNORMAL LOW (ref 20.0–24.0)
O2 SAT: 93 %
O2 Saturation: 85 %
O2 Saturation: 88 %
PCO2 ART: 34.6 mmHg — AB (ref 35.0–45.0)
PCO2 ART: 39 mmHg (ref 35.0–45.0)
PH ART: 7.152 — AB (ref 7.350–7.450)
PH ART: 7.195 — AB (ref 7.350–7.450)
PO2 ART: 80 mmHg (ref 80.0–100.0)
PO2 ART: 69 mmHg — AB (ref 80.0–100.0)
Patient temperature: 100
Patient temperature: 100.7
Patient temperature: 100.7
TCO2: 17 mmol/L (ref 0–100)
TCO2: 17 mmol/L (ref 0–100)
TCO2: 16 mmol/L (ref 0–100)
pCO2 arterial: 44.6 mmHg (ref 35.0–45.0)
pH, Arterial: 7.27 — ABNORMAL LOW (ref 7.350–7.450)
pO2, Arterial: 70 mmHg — ABNORMAL LOW (ref 80.0–100.0)

## 2015-07-17 LAB — GLUCOSE, CAPILLARY: Glucose-Capillary: 108 mg/dL — ABNORMAL HIGH (ref 65–99)

## 2015-07-17 LAB — CBC WITH DIFFERENTIAL/PLATELET
BASOS PCT: 0 %
Basophils Absolute: 0 10*3/uL (ref 0.0–0.1)
EOS ABS: 0 10*3/uL (ref 0.0–0.7)
Eosinophils Relative: 0 %
HEMATOCRIT: 34.3 % — AB (ref 36.0–46.0)
HEMOGLOBIN: 11.9 g/dL — AB (ref 12.0–15.0)
LYMPHS ABS: 0.8 10*3/uL (ref 0.7–4.0)
LYMPHS PCT: 7 %
MCH: 32.2 pg (ref 26.0–34.0)
MCHC: 34.7 g/dL (ref 30.0–36.0)
MCV: 92.7 fL (ref 78.0–100.0)
Monocytes Absolute: 0.2 10*3/uL (ref 0.1–1.0)
Monocytes Relative: 2 %
NEUTROS ABS: 10.2 10*3/uL — AB (ref 1.7–7.7)
Neutrophils Relative %: 91 %
Platelets: 140 10*3/uL — ABNORMAL LOW (ref 150–400)
RBC: 3.7 MIL/uL — ABNORMAL LOW (ref 3.87–5.11)
RDW: 13.6 % (ref 11.5–15.5)
WBC: 11.2 10*3/uL — ABNORMAL HIGH (ref 4.0–10.5)

## 2015-07-17 LAB — CORTISOL: CORTISOL PLASMA: 24.5 ug/dL

## 2015-07-17 LAB — STREP PNEUMONIAE URINARY ANTIGEN: STREP PNEUMO URINARY ANTIGEN: POSITIVE — AB

## 2015-07-17 LAB — TRIGLYCERIDES: TRIGLYCERIDES: 116 mg/dL (ref <150)

## 2015-07-17 LAB — LACTIC ACID, PLASMA: Lactic Acid, Venous: 3 mmol/L (ref 0.5–2.0)

## 2015-07-17 LAB — MRSA PCR SCREENING: MRSA by PCR: NEGATIVE

## 2015-07-17 LAB — PROCALCITONIN: Procalcitonin: 9.45 ng/mL

## 2015-07-17 MED ORDER — MIDAZOLAM HCL 2 MG/2ML IJ SOLN
2.0000 mg | INTRAMUSCULAR | Status: DC | PRN
  Filled 2015-07-17: qty 2

## 2015-07-17 MED ORDER — SODIUM CHLORIDE 0.9 % IV BOLUS (SEPSIS)
1000.0000 mL | Freq: Once | INTRAVENOUS | Status: AC
  Administered 2015-07-17: 1000 mL via INTRAVENOUS

## 2015-07-17 MED ORDER — VANCOMYCIN HCL IN DEXTROSE 750-5 MG/150ML-% IV SOLN
750.0000 mg | Freq: Once | INTRAVENOUS | Status: AC
  Administered 2015-07-17: 750 mg via INTRAVENOUS
  Filled 2015-07-17: qty 150

## 2015-07-17 MED ORDER — MIDAZOLAM HCL 2 MG/2ML IJ SOLN
INTRAMUSCULAR | Status: AC
  Administered 2015-07-17: 7 mg via INTRAVENOUS
  Filled 2015-07-17: qty 6

## 2015-07-17 MED ORDER — MIDAZOLAM HCL 2 MG/2ML IJ SOLN
7.0000 mg | Freq: Once | INTRAMUSCULAR | Status: AC
  Administered 2015-07-17: 7 mg via INTRAVENOUS

## 2015-07-17 MED ORDER — SODIUM CHLORIDE 0.9 % IV SOLN
INTRAVENOUS | Status: DC
  Administered 2015-07-17 – 2015-07-18 (×3): via INTRAVENOUS

## 2015-07-17 MED ORDER — LORAZEPAM 2 MG/ML IJ SOLN
1.0000 mg | INTRAMUSCULAR | Status: DC | PRN
  Administered 2015-07-17: 1 mg via INTRAVENOUS
  Filled 2015-07-17: qty 1

## 2015-07-17 MED ORDER — PIPERACILLIN-TAZOBACTAM 3.375 G IVPB
3.3750 g | Freq: Three times a day (TID) | INTRAVENOUS | Status: DC
  Administered 2015-07-17 – 2015-07-19 (×6): 3.375 g via INTRAVENOUS
  Filled 2015-07-17 (×8): qty 50

## 2015-07-17 MED ORDER — HYDROMORPHONE HCL 1 MG/ML IJ SOLN
1.0000 mg | INTRAMUSCULAR | Status: DC | PRN
  Administered 2015-07-17: 1 mg via INTRAVENOUS
  Filled 2015-07-17: qty 1

## 2015-07-17 MED ORDER — ALBUTEROL SULFATE (2.5 MG/3ML) 0.083% IN NEBU: 2.5000 mg | INHALATION_SOLUTION | RESPIRATORY_TRACT | Status: DC | PRN

## 2015-07-17 MED ORDER — ETOMIDATE 2 MG/ML IV SOLN
20.0000 mg | Freq: Once | INTRAVENOUS | Status: AC
  Administered 2015-07-17: 20 mg via INTRAVENOUS

## 2015-07-17 MED ORDER — FENTANYL CITRATE (PF) 100 MCG/2ML IJ SOLN
100.0000 ug | Freq: Once | INTRAMUSCULAR | Status: AC
  Administered 2015-07-17: 100 ug via INTRAVENOUS

## 2015-07-17 MED ORDER — VITAL HIGH PROTEIN PO LIQD
1000.0000 mL | ORAL | Status: DC
  Administered 2015-07-17: 1000 mL
  Administered 2015-07-18 (×13)
  Administered 2015-07-19: 1000 mL
  Filled 2015-07-17 (×3): qty 1000

## 2015-07-17 MED ORDER — PANTOPRAZOLE SODIUM 40 MG IV SOLR
40.0000 mg | INTRAVENOUS | Status: DC
  Administered 2015-07-17: 40 mg via INTRAVENOUS
  Filled 2015-07-17: qty 40

## 2015-07-17 MED ORDER — CETYLPYRIDINIUM CHLORIDE 0.05 % MT LIQD
7.0000 mL | Freq: Two times a day (BID) | OROMUCOSAL | Status: DC
  Administered 2015-07-17 – 2015-07-18 (×4): 7 mL via OROMUCOSAL

## 2015-07-17 MED ORDER — CHLORHEXIDINE GLUCONATE 0.12 % MT SOLN
15.0000 mL | Freq: Two times a day (BID) | OROMUCOSAL | Status: DC
  Administered 2015-07-17 – 2015-07-18 (×3): 15 mL via OROMUCOSAL
  Filled 2015-07-17: qty 15

## 2015-07-17 MED ORDER — NOREPINEPHRINE BITARTRATE 1 MG/ML IV SOLN
2.0000 ug/min | INTRAVENOUS | Status: DC
  Administered 2015-07-17: 2 ug/min via INTRAVENOUS
  Administered 2015-07-17: 4 ug/min via INTRAVENOUS
  Administered 2015-07-18: 12 ug/min via INTRAVENOUS
  Administered 2015-07-18: 10 ug/min via INTRAVENOUS
  Administered 2015-07-18: 8 ug/min via INTRAVENOUS
  Administered 2015-07-19: 3 ug/min via INTRAVENOUS
  Administered 2015-07-19: 8 ug/min via INTRAVENOUS
  Filled 2015-07-17 (×7): qty 4

## 2015-07-17 MED ORDER — SENNOSIDES 8.8 MG/5ML PO SYRP
5.0000 mL | ORAL_SOLUTION | Freq: Two times a day (BID) | ORAL | Status: DC | PRN
  Filled 2015-07-17: qty 5

## 2015-07-17 MED ORDER — FENTANYL CITRATE (PF) 100 MCG/2ML IJ SOLN
INTRAMUSCULAR | Status: AC
  Administered 2015-07-17: 100 ug via INTRAVENOUS
  Filled 2015-07-17: qty 2

## 2015-07-17 MED ORDER — BISACODYL 10 MG RE SUPP: 10.0000 mg | Freq: Every day | RECTAL | Status: DC | PRN

## 2015-07-17 MED ORDER — FENTANYL BOLUS VIA INFUSION
50.0000 ug | INTRAVENOUS | Status: DC | PRN
  Administered 2015-07-19 – 2015-07-26 (×3): 50 ug via INTRAVENOUS
  Filled 2015-07-17: qty 50

## 2015-07-17 MED ORDER — PROPOFOL 1000 MG/100ML IV EMUL
0.0000 ug/kg/min | INTRAVENOUS | Status: DC
  Administered 2015-07-17: 5 ug/kg/min via INTRAVENOUS
  Administered 2015-07-18 (×4): 50 ug/kg/min via INTRAVENOUS
  Administered 2015-07-18: 40 ug/kg/min via INTRAVENOUS
  Administered 2015-07-18 (×3): 50 ug/kg/min via INTRAVENOUS
  Administered 2015-07-19 (×3): 30 ug/kg/min via INTRAVENOUS
  Administered 2015-07-19: 25 ug/kg/min via INTRAVENOUS
  Administered 2015-07-20: 30 ug/kg/min via INTRAVENOUS
  Administered 2015-07-20: 25 ug/kg/min via INTRAVENOUS
  Administered 2015-07-20: 35 ug/kg/min via INTRAVENOUS
  Administered 2015-07-20: 29.973 ug/kg/min via INTRAVENOUS
  Administered 2015-07-21 (×2): 50 ug/kg/min via INTRAVENOUS
  Administered 2015-07-21 (×2): 40 ug/kg/min via INTRAVENOUS
  Administered 2015-07-21: 50 ug/kg/min via INTRAVENOUS
  Administered 2015-07-21: 40 ug/kg/min via INTRAVENOUS
  Administered 2015-07-21 (×2): 35 ug/kg/min via INTRAVENOUS
  Administered 2015-07-22: 50 ug/kg/min via INTRAVENOUS
  Administered 2015-07-22: 25 ug/kg/min via INTRAVENOUS
  Administered 2015-07-22: 50 ug/kg/min via INTRAVENOUS
  Administered 2015-07-22: 25 ug/kg/min via INTRAVENOUS
  Administered 2015-07-22: 50 ug/kg/min via INTRAVENOUS
  Administered 2015-07-22 – 2015-07-23 (×2): 25 ug/kg/min via INTRAVENOUS
  Filled 2015-07-17 (×3): qty 100
  Filled 2015-07-17: qty 200
  Filled 2015-07-17: qty 100
  Filled 2015-07-17 (×3): qty 200
  Filled 2015-07-17: qty 100
  Filled 2015-07-17: qty 200
  Filled 2015-07-17: qty 100
  Filled 2015-07-17: qty 200
  Filled 2015-07-17 (×4): qty 100
  Filled 2015-07-17 (×2): qty 200
  Filled 2015-07-17 (×4): qty 100
  Filled 2015-07-17: qty 200
  Filled 2015-07-17 (×2): qty 100

## 2015-07-17 MED ORDER — MIDAZOLAM HCL 2 MG/2ML IJ SOLN
2.0000 mg | INTRAMUSCULAR | Status: DC | PRN
  Administered 2015-07-24 – 2015-07-26 (×3): 2 mg via INTRAVENOUS
  Filled 2015-07-17 (×3): qty 2

## 2015-07-17 MED ORDER — VANCOMYCIN HCL 10 G IV SOLR
1500.0000 mg | INTRAVENOUS | Status: DC
  Filled 2015-07-17: qty 1500

## 2015-07-17 MED ORDER — FENTANYL CITRATE (PF) 100 MCG/2ML IJ SOLN
50.0000 ug | Freq: Once | INTRAMUSCULAR | Status: AC
  Administered 2015-07-17: 50 ug via INTRAVENOUS

## 2015-07-17 MED ORDER — HEPARIN SODIUM (PORCINE) 5000 UNIT/ML IJ SOLN
5000.0000 [IU] | Freq: Three times a day (TID) | INTRAMUSCULAR | Status: DC
  Administered 2015-07-17 – 2015-08-09 (×68): 5000 [IU] via SUBCUTANEOUS
  Filled 2015-07-17 (×66): qty 1

## 2015-07-17 MED ORDER — ACETAMINOPHEN 160 MG/5ML PO SOLN
650.0000 mg | Freq: Four times a day (QID) | ORAL | Status: DC | PRN
  Administered 2015-07-17 – 2015-07-22 (×8): 650 mg
  Filled 2015-07-17 (×8): qty 20.3

## 2015-07-17 MED ORDER — SODIUM CHLORIDE 0.9 % IV SOLN
25.0000 ug/h | INTRAVENOUS | Status: DC
  Administered 2015-07-17: 100 ug/h via INTRAVENOUS
  Administered 2015-07-18 – 2015-07-19 (×3): 200 ug/h via INTRAVENOUS
  Administered 2015-07-20: 100 ug/h via INTRAVENOUS
  Administered 2015-07-20: 200 ug/h via INTRAVENOUS
  Administered 2015-07-21: 300 ug/h via INTRAVENOUS
  Administered 2015-07-21 (×2): 400 ug/h via INTRAVENOUS
  Administered 2015-07-22: 200 ug/h via INTRAVENOUS
  Administered 2015-07-22: 400 ug/h via INTRAVENOUS
  Administered 2015-07-23: 200 ug/h via INTRAVENOUS
  Administered 2015-07-23 – 2015-07-24 (×2): 100 ug/h via INTRAVENOUS
  Administered 2015-07-25: 200 ug/h via INTRAVENOUS
  Administered 2015-07-27: 100 ug/h via INTRAVENOUS
  Administered 2015-07-28: 125 ug/h via INTRAVENOUS
  Filled 2015-07-17 (×18): qty 50

## 2015-07-17 NOTE — Progress Notes (Signed)
ANTIBIOTIC CONSULT NOTE - INITIAL  Pharmacy Consult for Vancomycin and Zosyn Indication: Sepsis/PNA  No Known Allergies  Patient Measurements: Height: 5' 7"  (170.2 cm) Weight: 273 lb 5.9 oz (124 kg) IBW/kg (Calculated) : 61.6  Vital Signs: BP: 77/50 mmHg (12/31 1335) Pulse Rate: 113 (12/31 1340) Intake/Output from previous day:   Intake/Output from this shift:    Labs: No results for input(s): WBC, HGB, PLT, LABCREA, CREATININE in the last 72 hours. CrCl cannot be calculated (Patient has no serum creatinine result on file.). No results for input(s): VANCOTROUGH, VANCOPEAK, VANCORANDOM, GENTTROUGH, GENTPEAK, GENTRANDOM, TOBRATROUGH, TOBRAPEAK, TOBRARND, AMIKACINPEAK, AMIKACINTROU, AMIKACIN in the last 72 hours.   Microbiology: No results found for this or any previous visit (from the past 720 hour(s)).  Medical History: Past Medical History  Diagnosis Date  . Hypertension     Medications:  Scheduled:  . antiseptic oral rinse  7 mL Mouth Rinse q12n4p  . chlorhexidine  15 mL Mouth Rinse BID  . heparin subcutaneous  5,000 Units Subcutaneous 3 times per day  . pantoprazole (PROTONIX) IV  40 mg Intravenous Q24H  . piperacillin-tazobactam (ZOSYN)  IV  3.375 g Intravenous 3 times per day  . sodium chloride  1,000 mL Intravenous Once  . [START ON 07/18/2015] vancomycin  1,500 mg Intravenous Q24H  . vancomycin  750 mg Intravenous Once   Infusions:  . sodium chloride    . norepinephrine (LEVOPHED) Adult infusion     Assessment: 44 yo F admitted on 07/17/2015 from [facility] ER with 5 day h/o cough, SOB, weakness, pleuritis chest pain, headache and chills. CXR c/w LLL PNA started on CTX + azithro + vanc. Upon admission just finishing 1750 mg IV vanc. Pharmacy consulted to dose vancomycin and zosyn for sepsis/PNA. Labs from [facility] include WBC 17, LA 4.1, SCr 2.06 (normalized CrCl 48 ml/min), BCx pending.   Vanc 12/31>> Zosyn 12/31>>   Goal of Therapy:  Vancomycin trough  level 15-20 mcg/ml  Plan:  - Vanc 750 mg to complete 2500 mg load, then continue with 1500 mg IV q24h  - Zosyn 3.375 gm IV q8h (4h infusion)  - Monitor temp, WBC, C&S, VT at [place]. Velva Harman, PharmD, BCPS, CPP Clinical Pharmacist Pager: [phone] Phone: [phone] 07/17/2015 1:57 PM

## 2015-07-17 NOTE — H&P (Signed)
PULMONARY / CRITICAL CARE MEDICINE   Name: Cynthia Murray MRN: [id number] DOB: [birth year]    ADMISSION DATE:  07/17/2015 CONSULTATION DATE: 07/17/15  REFERRING MD:  Lovie Macadamia EDP   CHIEF COMPLAINT:  Septic shock /LLL PNA  HISTORY OF PRESENT ILLNESS:   44 yo WF smoker refered to [facility] from [facility] ER . She presented with 5 day hx of cough, sob , weakness , pleuritic chest pain, headache and chills. She was found to be hypotensive and hypoxic. CXR showed LLL PNA. She was started on Levophed and IV Abx /Zithromax . ABG showed pH7.39/PCO2 27 /Po2 at 56. Pt complains that her head is hurting bad. She feels sick all over . Has chronic back pain that is hurting worse. Lactate was elevated at 4.1  BNP elevated ~2700. Troponin neg. WBC elevated~17K . Pt says she has had pink tinged mucus mixed with green for last 5 days.  Pt is a heavy smoker . 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 She was treated with abx with clinical improvement . According to office notes review she was recommended to have repeat CT chest but declined due to financial reasons.  She denies any vomiting or diarrhea.  No abdominal pain, urinary symptoms.   PAST MEDICAL HISTORY :  She  has a past medical history of Hypertension.  PAST SURGICAL HISTORY: She  has past surgical history that includes Hand surgery (1994); removal of cyst between lungs (1998); Appendectomy (2004); and Cholecystectomy (2004).  No Known Allergies  No current facility-administered medications on file prior to encounter.   Current Outpatient Prescriptions on File Prior to Encounter  Medication Sig  . amitriptyline (ELAVIL) 150 MG tablet Take 150 mg by mouth.  . busPIRone (BUSPAR) 10 MG tablet Take 1 tablet by mouth twice daily  . clonazePAM (KLONOPIN) 1 MG tablet Take 1 tablet (1 mg total) by mouth 3 (three) times daily as needed. for anxiety  . diphenhydrAMINE (BENADRYL) 25 mg capsule Take 50 mg by mouth.  Marland Kitchen FLUoxetine (PROZAC) 20 MG  capsule TAKE TWO CAPSULES BY MOUTH IN THE MORNING AND TAKE TWO CAPSULES IN THE EVENING  . gabapentin (NEURONTIN) 300 MG capsule Take 900 mg by mouth.  . hydrochlorothiazide (HYDRODIURIL) 25 MG tablet One tablet by mouth every morning as needed for swelling.  . meloxicam (MOBIC) 15 MG tablet Take 1 tablet (15 mg total) by mouth daily. For back.  . metoprolol tartrate (LOPRESSOR) 25 MG tablet Take 2 tablets (50 mg total) by mouth 2 (two) times daily.  . ranitidine (ZANTAC) 75 MG tablet Take 75 mg by mouth.  . tizanidine (ZANAFLEX) 6 MG capsule Take 6 mg by mouth.  . traMADol (ULTRAM) 50 MG tablet Take 1 tablet (50 mg total) by mouth every 6 (six) hours as needed.    FAMILY HISTORY:  Her has no family status information on file.   SOCIAL HISTORY: She  reports that she has been smoking Cigarettes.  She has a 15 pack-year smoking history. She does not have any smokeless tobacco history on file. She reports that she drinks alcohol. She reports that she does not use illicit drugs.  REVIEW OF SYSTEMS:   Constitutional:   No  weight loss, night sweats,  Fevers,  +chills, fatigue, or  lassitude.  HEENT:  ++ headaches,   No Difficulty swallowing,  Tooth/dental problems, or  Sore throat,                No sneezing, itching, ear  ache, nasal congestion, post nasal drip,   CV:  No chest pain,  Orthopnea, PND, swelling in lower extremities, anasarca, dizziness, palpitations, syncope.   GI  No heartburn, indigestion, abdominal pain, nausea, vomiting, diarrhea, change in bowel habits, loss of appetite, bloody stools.   Resp:+  shortness of breath    No wheezing.  No chest wall deformity  Skin: no rash or lesions.  GU: no dysuria, change in color of urine, no urgency or frequency.  No flank pain, no hematuria   MS:  + back pain   Psych:  +chronic depression or anxiety.  No memory loss.       SUBJECTIVE:  " sick for 5 days, feel bad all over "   VITAL SIGNS: There were no vitals taken for  this visit.  HEMODYNAMICS:    VENTILATOR SETTINGS:    INTAKE / OUTPUT:    PHYSICAL EXAMINATION: GEN: A/Ox3; obese female   HEENT:  [place]/AT, dry mucosa   NECK:  Supple w/ fair ROM; no JVD;   no lymphadenopathy.  RESP  BB crackles , no accessory muscle use, no dullness to percussion  CARD:  RRR, no m/r/g  , no peripheral edema, pulses intact, no cyanosis or clubbing.  GI:   Soft & nt; nml bowel sounds; no organomegaly or masses detected.  Musco: Warm bil, no deformities or joint swelling noted.   Neuro: alert, no focal deficits noted.    Skin: Warm, no lesions or rashes   LABS:  BMET No results for input(s): NA, K, CL, CO2, BUN, CREATININE, GLUCOSE in the last 168 hours.  Electrolytes No results for input(s): CALCIUM, MG, PHOS in the last 168 hours.  CBC No results for input(s): WBC, HGB, HCT, PLT in the last 168 hours.  Coag's No results for input(s): APTT, INR in the last 168 hours.  Sepsis Markers No results for input(s): LATICACIDVEN, PROCALCITON, O2SATVEN in the last 168 hours.  ABG No results for input(s): PHART, PCO2ART, PO2ART in the last 168 hours.  Liver Enzymes No results for input(s): AST, ALT, ALKPHOS, BILITOT, ALBUMIN in the last 168 hours.  Cardiac Enzymes No results for input(s): TROPONINI, PROBNP in the last 168 hours.  Glucose No results for input(s): GLUCAP in the last 168 hours.  Imaging No results found.   STUDIES:  CT chest 12/31 >> CT head 12/31 >>  CULTURES: [facility] 12/31 [facility]) >>  ANTIBIOTICS: Vanc 12/31 >> Zosyn 12/31 >>  SIGNIFICANT EVENTS: Transfer from [facility] ER 12/31   LINES/TUBES:   DISCUSSION:   ASSESSMENT / PLAN:  PULMONARY A: LLL PNA ? Possible obstructive  Possible LLL lung mass -smoker  Acute Hypoxic Resp failure   P:   Check CT chest  O2 to keep sats >90% IV abx per ID sxn  Albuterol Neb As needed    CARDIOVASCULAR A:  Hypotension /Septic shock    P:  Levophed titrate for MAP >65   Hold home HCTZ /lopressor  Check bnp , if remains elevated consider 2 D echo  Tr lactate   RENAL A:   Acute Renal Failure in setting of sepsis/shock  P:   Cont fluid resusciatation  Avoid nephrotoxins  Tr bmet  Replace electrolytes as indicated   GASTROINTESTINAL A:   No acute issues  P:   NPO  PPI   HEMATOLOGIC A:  No acute issues   P:  Tr cbc   INFECTIOUS A:   LLL PNA ? Post obstructive  Sepsis secondary to PNA  , elevated  lactate   P:   IV abx with Vanc/Zosyn  Tr Lactate  CT chest pending   ENDOCRINE A:    No acute issues   P:   Follow glucose on chem panel   NEUROLOGIC A:   Headache  Chronic Back pain  Anxiety /Depression   P:   Check CT head  Dilaudid As needed   Hold home zanaflex/tramadol/mobic /neurontin/prozac  Ativan As needed       FAMILY  - Updates: no family present   - Inter-disciplinary family meet or Palliative Care meeting due by:  day 7   [facility] NP-C Pulmonary and [place] Pager: [phone]  07/17/2015, 1:03 PM

## 2015-07-17 NOTE — Progress Notes (Signed)
[place] Progress Note Patient Name: Cynthia Murray DOB: 1970-11-08 MRN: [id number]   Date of Service  07/17/2015  HPI/Events of Note  Abg;s with resp and met acidosis  eICU Interventions  Increase vent rate from 24 to 28/ confirmed not air trapping at the faster rate      Intervention Category Major Interventions: Respiratory failure - evaluation and management  Christinia Gully 07/17/2015, 8:21 PM

## 2015-07-17 NOTE — Procedures (Signed)
Intubation Procedure Note Cynthia Murray [id number] [birth year]  Procedure: Intubation Indications: Respiratory insufficiency  Procedure Details Consent: Risks of procedure as well as the alternatives and risks of each were explained to the (patient/caregiver).  Consent for procedure obtained. Time Out: Verified patient identification, verified procedure, site/side was marked, verified correct patient position, special equipment/implants available, medications/allergies/relevent history reviewed, required imaging and test results available.  Performed  Maximum sterile technique was used including gloves, hand hygiene and mask.  MAC and 3  Given 7 mg versed, 100 mcg fentanyl, 20 mg etomidate.  Inserted 7.5 ETT to 23 cm at lip.  Confirmed placement with auscultation and CO2 detector.  Evaluation Hemodynamic Status: Transient hypotension treated with fluid; O2 sats: transiently fell during during procedure Patient's Current Condition: stable Complications: No apparent complications Patient did tolerate procedure well. Chest X-ray ordered to verify placement.  CXR: pending.   Cynthia Mires, MD [facility] Pulmonary/Critical Care 07/17/2015, 6:07 PM Pager:  [phone] After 3pm call: [phone]

## 2015-07-17 NOTE — Progress Notes (Signed)
RT pulled back ETT tube per MD verbal order. ETT is now located 22 at the lip.

## 2015-07-18 ENCOUNTER — Inpatient Hospital Stay (HOSPITAL_COMMUNITY): Payer: Self-pay

## 2015-07-18 DIAGNOSIS — R6521 Severe sepsis with septic shock: Secondary | ICD-10-CM

## 2015-07-18 LAB — POCT I-STAT 3, ART BLOOD GAS (G3+)
ACID-BASE DEFICIT: 12 mmol/L — AB (ref 0.0–2.0)
ACID-BASE DEFICIT: 11 mmol/L — AB (ref 0.0–2.0)
ACID-BASE DEFICIT: 10 mmol/L — AB (ref 0.0–2.0)
ACID-BASE DEFICIT: 9 mmol/L — AB (ref 0.0–2.0)
ACID-BASE DEFICIT: 7 mmol/L — AB (ref 0.0–2.0)
Bicarbonate: 15.2 mEq/L — ABNORMAL LOW (ref 20.0–24.0)
Bicarbonate: 16.3 mEq/L — ABNORMAL LOW (ref 20.0–24.0)
Bicarbonate: 16.1 mEq/L — ABNORMAL LOW (ref 20.0–24.0)
Bicarbonate: 16.9 mEq/L — ABNORMAL LOW (ref 20.0–24.0)
Bicarbonate: 17.9 mEq/L — ABNORMAL LOW (ref 20.0–24.0)
O2 SAT: 96 %
O2 SAT: 96 %
O2 SAT: 92 %
O2 SAT: 91 %
O2 SAT: 92 %
PCO2 ART: 37.3 mmHg (ref 35.0–45.0)
PCO2 ART: 40.5 mmHg (ref 35.0–45.0)
PH ART: 7.22 — AB (ref 7.350–7.450)
PH ART: 7.217 — AB (ref 7.350–7.450)
PH ART: 7.248 — AB (ref 7.350–7.450)
PH ART: 7.275 — AB (ref 7.350–7.450)
PH ART: 7.322 — AB (ref 7.350–7.450)
Patient temperature: 99.5
Patient temperature: 99.7
Patient temperature: 99.8
TCO2: 16 mmol/L (ref 0–100)
TCO2: 18 mmol/L (ref 0–100)
TCO2: 17 mmol/L (ref 0–100)
TCO2: 18 mmol/L (ref 0–100)
TCO2: 19 mmol/L (ref 0–100)
pCO2 arterial: 37.1 mmHg (ref 35.0–45.0)
pCO2 arterial: 36.7 mmHg (ref 35.0–45.0)
pCO2 arterial: 34.8 mmHg — ABNORMAL LOW (ref 35.0–45.0)
pO2, Arterial: 98 mmHg (ref 80.0–100.0)
pO2, Arterial: 104 mmHg — ABNORMAL HIGH (ref 80.0–100.0)
pO2, Arterial: 75 mmHg — ABNORMAL LOW (ref 80.0–100.0)
pO2, Arterial: 71 mmHg — ABNORMAL LOW (ref 80.0–100.0)
pO2, Arterial: 70 mmHg — ABNORMAL LOW (ref 80.0–100.0)

## 2015-07-18 LAB — GLUCOSE, CAPILLARY
GLUCOSE-CAPILLARY: 128 mg/dL — AB (ref 65–99)
GLUCOSE-CAPILLARY: 129 mg/dL — AB (ref 65–99)
GLUCOSE-CAPILLARY: 134 mg/dL — AB (ref 65–99)
GLUCOSE-CAPILLARY: 137 mg/dL — AB (ref 65–99)
GLUCOSE-CAPILLARY: 160 mg/dL — AB (ref 65–99)
Glucose-Capillary: 143 mg/dL — ABNORMAL HIGH (ref 65–99)
Glucose-Capillary: 146 mg/dL — ABNORMAL HIGH (ref 65–99)

## 2015-07-18 LAB — CBC
HCT: 36.4 % (ref 36.0–46.0)
Hemoglobin: 12 g/dL (ref 12.0–15.0)
MCH: 30.2 pg (ref 26.0–34.0)
MCHC: 33 g/dL (ref 30.0–36.0)
MCV: 91.7 fL (ref 78.0–100.0)
PLATELETS: 194 10*3/uL (ref 150–400)
RBC: 3.97 MIL/uL (ref 3.87–5.11)
RDW: 14 % (ref 11.5–15.5)
WBC: 13 10*3/uL — ABNORMAL HIGH (ref 4.0–10.5)

## 2015-07-18 LAB — COMPREHENSIVE METABOLIC PANEL
ALT: 17 U/L (ref 14–54)
AST: 35 U/L (ref 15–41)
Albumin: 2.2 g/dL — ABNORMAL LOW (ref 3.5–5.0)
Alkaline Phosphatase: 77 U/L (ref 38–126)
Anion gap: 10 (ref 5–15)
BILIRUBIN TOTAL: 0.6 mg/dL (ref 0.3–1.2)
BUN: 28 mg/dL — AB (ref 6–20)
CO2: 18 mmol/L — ABNORMAL LOW (ref 22–32)
CREATININE: 1.42 mg/dL — AB (ref 0.44–1.00)
Calcium: 7.4 mg/dL — ABNORMAL LOW (ref 8.9–10.3)
Chloride: 110 mmol/L (ref 101–111)
GFR, EST AFRICAN AMERICAN: 52 mL/min — AB (ref >60)
GFR, EST NON AFRICAN AMERICAN: 45 mL/min — AB (ref >60)
Glucose, Bld: 129 mg/dL — ABNORMAL HIGH (ref 65–99)
POTASSIUM: 4.3 mmol/L (ref 3.5–5.1)
Sodium: 138 mmol/L (ref 135–145)
TOTAL PROTEIN: 5.2 g/dL — AB (ref 6.5–8.1)

## 2015-07-18 LAB — PROCALCITONIN: PROCALCITONIN: 11.47 ng/mL

## 2015-07-18 MED ORDER — CETYLPYRIDINIUM CHLORIDE 0.05 % MT LIQD
7.0000 mL | Freq: Four times a day (QID) | OROMUCOSAL | Status: DC
  Administered 2015-07-19 – 2015-07-28 (×38): 7 mL via OROMUCOSAL

## 2015-07-18 MED ORDER — SODIUM CHLORIDE 0.9 % IV SOLN
INTRAVENOUS | Status: DC | PRN
  Administered 2015-07-27 – 2015-07-28 (×2): via INTRA_ARTERIAL

## 2015-07-18 MED ORDER — VANCOMYCIN HCL IN DEXTROSE 1-5 GM/200ML-% IV SOLN
1000.0000 mg | Freq: Two times a day (BID) | INTRAVENOUS | Status: DC
  Administered 2015-07-18 (×2): 1000 mg via INTRAVENOUS
  Filled 2015-07-18 (×4): qty 200

## 2015-07-18 MED ORDER — CHLORHEXIDINE GLUCONATE 0.12 % MT SOLN
15.0000 mL | Freq: Two times a day (BID) | OROMUCOSAL | Status: DC
  Administered 2015-07-18 – 2015-07-28 (×19): 15 mL via OROMUCOSAL

## 2015-07-18 MED ORDER — SODIUM BICARBONATE 8.4 % IV SOLN
INTRAVENOUS | Status: DC
  Administered 2015-07-18 – 2015-07-19 (×2): via INTRAVENOUS
  Filled 2015-07-18 (×4): qty 150

## 2015-07-18 MED ORDER — PANTOPRAZOLE SODIUM 40 MG PO PACK
40.0000 mg | PACK | ORAL | Status: DC
  Administered 2015-07-18 – 2015-07-27 (×10): 40 mg
  Filled 2015-07-18 (×12): qty 20

## 2015-07-18 NOTE — Progress Notes (Addendum)
Pharmacy Antibiotic Follow-up Note  Cynthia Murray is a 45 y.o. year-old female admitted on 07/17/2015.  The patient is currently on day 2 of Vancomycin/Zosyn for septic shock/PNA -SCr= 1.42 (down from 2.06 at [facility]), CrCl ~ 70 -WBC= 13, tmax= 102.4 and currently 100, PCT= 11.47 (up) -Strep urinary antigen positive  Plan: -Change vancomycin to 1077m IV q12h -No zosyn changes needed -Will follow renal function, cultures and clinical progress   Temp (24hrs), Avg:101.1 F (38.4 C), Min:100 F (37.8 C), Max:102.4 F (39.1 C)   Recent Labs Lab 07/17/15 1645 07/18/15 0640  WBC 11.2* 13.0*    Recent Labs Lab 07/17/15 1947 07/18/15 0503  CREATININE 1.34* 1.42*   Estimated Creatinine Clearance: 70.9 mL/min (by C-G formula based on Cr of 1.42).    No Known Allergies  Antimicrobials this admission: Vanc 12/31>> Zosyn 12/31>>  Levels/dose changes this admission: 12/31 vancomycin 15020mIV q24h 1/1 vancomycin 10042mV q12h  Microbiology results: 12/31 strep: + 12/31: legionella - IP 12/31 resp- pending  Thank you for allowing pharmacy to be a part of this patient's care.  MeyDareen PianoarmD 07/18/2015 8:57 AM

## 2015-07-18 NOTE — Progress Notes (Signed)
Dr Melvyn Novas notified of ET tube placement , impression and recommendation from Dr Lovey Newcomer. Order to retract ETT by 2cm. Also notified of patient's elevated lactic acid. Will continue to monitor and update MD.

## 2015-07-18 NOTE — Progress Notes (Signed)
eLink Physician-Brief Progress Note Patient Name: Cynthia Murray DOB: 10-19-1970 MRN: [id number]   Date of Service  07/18/2015  HPI/Events of Note  Requested to review CXR for central venous line placement. L IJ central venous catheter in distal SVC. No pneumothorax appreciated.   eICU Interventions  OK to use L IJ central venous catheter. Will monitor CVP.      Intervention Category Intermediate Interventions: Diagnostic test evaluation  Sommer,Steven Eugene 07/18/2015, 5:13 AM

## 2015-07-18 NOTE — Progress Notes (Signed)
eLink Physician-Brief Progress Note Patient Name: Cynthia Murray DOB: [birth year] MRN: [id number]   Date of Service  07/18/2015  HPI/Events of Note  ABG on 60%/PRVC 35/TV 500/P 10 = 7.32/34.8/70/17.9. Currently on a NaHCO3 IV infusion.   eICU Interventions  Continue current management.      Intervention Category Major Interventions: Acid-Base disturbance - evaluation and management;Respiratory failure - evaluation and management  Sommer,Steven Eugene 07/18/2015, 11:20 PM

## 2015-07-18 NOTE — Procedures (Signed)
Arterial Catheter Insertion Procedure Note Cynthia Murray [id number] 07/27/1970  Procedure: Insertion of Arterial Catheter  Indications: Blood pressure monitoring and Frequent blood sampling  Procedure Details Consent: Unable to obtain consent because of altered level of consciousness. Time Out: Verified patient identification, verified procedure, site/side was marked, verified correct patient position, special equipment/implants available, medications/allergies/relevent history reviewed, required imaging and test results available.  Performed  Maximum sterile technique was used including antiseptics, cap, gloves, gown, hand hygiene, mask and sheet. Skin prep: Chlorhexidine; local anesthetic administered 20 gauge catheter was inserted into right radial artery using the Seldinger technique.  Evaluation Blood flow good; BP tracing good. Complications: No apparent complications.   Bayard Beaver 07/18/2015

## 2015-07-18 NOTE — Procedures (Signed)
Consent:  Obtained by nursing staff via phone.  Procedure:  Left Internal Jugular Central Venous Catheter Insertion with Ultrasound Guidance   Medications:  Lidocaine 1% 2cc Subcutaneous  Description of Procedure: Time out was performed identifying correct patient and procedure.  Patient was prepped and draped in sterile fashion. She was lain recumbent in reverse trendelenburg position. Left jugular vein and left common carotid were identified with ultrasound.  Lidocaine was used to locally anesthetize the patient's skin. Needle was inserted into the internal jugular vein under direct ultrasound visualization and dark blood was aspirated. Syringe was removed and patient had non-pulsatile blood flow. Guidewire was then inserted through the needle into the vein and needle was removed. Guidewire placement in the vein was confirmed with bedside ultrasound. Skin nick was made with sterile scalpel. Dilator was then inserted over the guidewire into subcutaneous tissue. Central venous catheter was then inserted over the guidewire into the vein and wire was removed. Catheter was sewn into place and sterile dressing applied. Stat post procedure portable CXR pending.  Complications:  None

## 2015-07-18 NOTE — Progress Notes (Signed)
PULMONARY / CRITICAL CARE MEDICINE   Name: Cynthia Murray MRN: [id number] DOB: 1971/03/13    ADMISSION DATE:  07/17/2015  REFERRING MD:  Lovie Macadamia EDP   CHIEF COMPLAINT:  Short of breath  SUBJECTIVE:  Remains on full vent support, pressors.  VITAL SIGNS: BP 90/53 mmHg  Pulse 130  Temp(Src) 99.5 F (37.5 C) (Oral)  Resp 28  Ht 5' 7"  (1.702 m)  Wt 275 lb 12.7 oz (125.1 kg)  BMI 43.19 kg/m2  SpO2 97%  LMP  (LMP Unknown)  HEMODYNAMICS: CVP:  [9 mmHg-16 mmHg] 10 mmHg  VENTILATOR SETTINGS: Vent Mode:  [-] PRVC FiO2 (%):  [60 %-100 %] 90 % Set Rate:  [12 bmp-28 bmp] 25 bmp Vt Set:  [500 mL] 500 mL PEEP:  [8 cmH20] 8 cmH20 Plateau Pressure:  [23 cmH20-27 cmH20] 27 cmH20  INTAKE / OUTPUT: I/O last 3 completed shifts: In: 2608.3 [I.V.:2363.3; NG/GT:170; IV Piggyback:75] Out: 1350 [[id number]:[id number]]  PHYSICAL EXAMINATION: General: sedated Neuro: RASS -3 HEENT: ETT in place, pupils reactive Cardiac: regular, tachycardic Chest: b/l crackles Lt > Rt Abd: soft, non tender Ext: 1+ edema Skin: no rashes  LABS:  BMET  Recent Labs Lab 07/17/15 1947 07/18/15 0503  NA 136 138  K 4.3 4.3  CL 109 110  CO2 17* 18*  BUN 25* 28*  CREATININE 1.34* 1.42*  GLUCOSE 119* 129*    Electrolytes  Recent Labs Lab 07/17/15 1947 07/18/15 0503  CALCIUM 7.0* 7.4*    CBC  Recent Labs Lab 07/17/15 1645 07/18/15 0640  WBC 11.2* 13.0*  HGB 11.9* 12.0  HCT 34.3* 36.4  PLT 140* 194    Coag's No results for input(s): APTT, INR in the last 168 hours.  Sepsis Markers  Recent Labs Lab 07/17/15 1645 07/17/15 1947 07/18/15 0503  LATICACIDVEN 3.0*  --   --   PROCALCITON  --  9.45 11.47    ABG  Recent Labs Lab 07/17/15 1710 07/17/15 2001 07/17/15 2252  PHART 7.270* 7.152* 7.195*  PCO2ART 34.6* 44.6 39.0  PO2ART 80.0 69.0* 70.0*    Liver Enzymes  Recent Labs Lab 07/17/15 1947 07/18/15 0503  AST 25 35  ALT 15 17  ALKPHOS 75 77  BILITOT 0.2* 0.6  ALBUMIN  2.5* 2.2*    Cardiac Enzymes No results for input(s): TROPONINI, PROBNP in the last 168 hours.  Glucose  Recent Labs Lab 07/17/15 2031 07/17/15 2338 07/18/15 0359 07/18/15 0821  GLUCAP 108* 134* 129* 137*    Imaging Ct Head Wo Contrast  07/17/2015  CLINICAL DATA:  Headache for the last 5 days. Pneumonia. Left-sided lung mass. EXAM: CT HEAD WITHOUT CONTRAST TECHNIQUE: Contiguous axial images were obtained from the base of the skull through the vertex without intravenous contrast. COMPARISON:  None. FINDINGS: Sinuses/Soft tissues: Clear paranasal sinuses and mastoid air cells. Intracranial: No mass lesion, hemorrhage, hydrocephalus, acute infarct, intra-axial, or extra-axial fluid collection. IMPRESSION: Normal head CT. Electronically Signed   By: Abigail Miyamoto M.D.   On: 07/17/2015 16:13   Ct Chest Wo Contrast  07/17/2015  CLINICAL DATA:  45 year old female with headache and weakness for the past 5 days. Worsening shortness of breath. EXAM: CT CHEST WITHOUT CONTRAST TECHNIQUE: Multidetector CT imaging of the chest was performed following the standard protocol without IV contrast. COMPARISON:  Chest CT 02/12/2015. FINDINGS: Mediastinum/Lymph Nodes: Heart size is normal. There is no significant pericardial fluid, thickening or pericardial calcification. No pathologically enlarged mediastinal or hilar lymph nodes. Please note that accurate exclusion of hilar adenopathy is limited  on noncontrast CT scans. Esophagus is unremarkable in appearance. No axillary lymphadenopathy. Lungs/Pleura: The previously noted mass-like opacity in the superior segment of the left lower lobe seen on prior study 02/12/2015 has completely resolved. However, there is now near complete lobar consolidation throughout the left upper lobe, and widespread peribronchovascular ground-glass attenuation, consolidation thickening of the peribronchovascular interstitium throughout the remainder of the lungs bilaterally, indicative  of early endobronchial spread of infection. No pleural effusions. Surgical clips in the infrahilar aspect of the medial right lower lobe. Upper Abdomen: Status post cholecystectomy. Musculoskeletal/Soft Tissues: There are no aggressive appearing lytic or blastic lesions noted in the visualized portions of the skeleton. IMPRESSION: 1. Lobar pneumonia of the left upper lobe with developing bronchopneumonia throughout the remaining portions of the lungs, presumably from endobronchial spread of infection. 2. Additional incidental findings, as above. Electronically Signed   By: Vinnie Langton M.D.   On: 07/17/2015 16:34   Dg Chest Port 1 View  07/18/2015  CLINICAL DATA:  45 year old female with central line placement and pneumonia. EXAM: PORTABLE CHEST 1 VIEW COMPARISON:  Radiograph dated 07/07/2015 FINDINGS: Endotracheal tube remains above the carina enteric tube courses towards the left hemiabdomen. There has been interval placement of the left IJ central line with tip over central SVC. Bilateral diffuse airspace opacities, left greater right again noted. No pneumothorax. Stable cardiac silhouette. The osseous structures appear unremarkable. IMPRESSION: Interval placement of a left IJ central line with tip over central SVC. No pneumothorax. Diffuse bilateral airspace opacities. Electronically Signed   By: Anner Crete M.D.   On: 07/18/2015 04:37   Dg Chest Port 1 View  07/17/2015  CLINICAL DATA:  Patient status post ET tube placement. EXAM: PORTABLE CHEST 1 VIEW COMPARISON:  Chest CT earlier same day FINDINGS: ET tube terminates in the distal trachea. Enteric tube courses inferior to the diaphragm. Obscured cardiac and mediastinal contours. Diffuse left-greater-than-right pulmonary airspace opacities. No large pleural effusion or pneumothorax. IMPRESSION: ET tube terminates in the distal trachea, recommend retraction. Left-greater-than-right airspace opacities compatible with extensive pneumonia. These  results were called by telephone at the time of interpretation on 07/17/2015 at 6:59 pm to Rose Phi, RN, who verbally acknowledged these results. Electronically Signed   By: Lovey Newcomer M.D.   On: 07/17/2015 19:01     STUDIES:  12/31 CT chest >> Lobar consolidation LUL with widespread infiltrates 12/31 CT head >> negative  CULTURES: 12/31 Blood ([place]) >> 12/31 Pneumococcal Ag >> POSITIVE 12/31 Legionella Ag >> 01/01 HIV >>  ANTIBIOTICS: Vanc 12/31 >> Zosyn 12/31 >>  SIGNIFICANT EVENTS: 12/31 Transfer from [place], [place], Sepsis 01/01 ARDS protocol  LINES/TUBES: 12/31 ETT >> 12/31 Lt IJ CVL >>  DISCUSSION: 44 yo female with septic shock, ARDS from pneumococcal lobar pneumonia.  ASSESSMENT / PLAN:  PULMONARY A: Acute hypoxic respiratory failure 2nd to CAP and ARDS. P:   ARDS protocol F/u CXR, ABG  CARDIOVASCULAR A:  Septic shock. Hx of HTN. P:  Levophed titrate for MAP > 65  F/u Echo Hold home HCTZ /lopressor   RENAL A:   AKI. Non gap metabolic acidosis. Lactic acidosis. P:   Add HCO3 to IV fluid 1/01 F/u BMET, ABG  GASTROINTESTINAL A:   Nutrition. P:   Tube feeds while on vent Protonix for SUP  HEMATOLOGIC A:   Leukocytosis. P:  F/u CBC SQ heparin for DVT prophylaxis  INFECTIOUS A:   Septic shock 2nd to Pneumococcal lobar pneumonia. P:   Day 2 of Abx, currently on vancomycin,  zosyn >> narrow once cx results available Check HIV F/u procalcitonin   ENDOCRINE A:   No acute issues. P:   Monitor CBG while on tube feeds  NEUROLOGIC A:   Acute encephalopathy 2nd to sepsis. Hx of Headaches, chronic back pain, anxiety, depression. P:   RASS goal -2 to -3  Hold home zanaflex/tramadol/mobic /neurontin/prozac   CC time 41 minutes.  [name], MD [facility] Pulmonary/Critical Care 07/18/2015, 10:55 AM Pager:  [phone] After 3pm call: [phone]

## 2015-07-19 ENCOUNTER — Inpatient Hospital Stay (HOSPITAL_COMMUNITY): Payer: MEDICAID

## 2015-07-19 ENCOUNTER — Inpatient Hospital Stay (HOSPITAL_COMMUNITY): Payer: Self-pay

## 2015-07-19 DIAGNOSIS — R0600 Dyspnea, unspecified: Secondary | ICD-10-CM

## 2015-07-19 DIAGNOSIS — Z7189 Other specified counseling: Secondary | ICD-10-CM

## 2015-07-19 LAB — BASIC METABOLIC PANEL
ANION GAP: 8 (ref 5–15)
Anion gap: 8 (ref 5–15)
Anion gap: 8 (ref 5–15)
BUN: 16 mg/dL (ref 6–20)
BUN: 12 mg/dL (ref 6–20)
BUN: 13 mg/dL (ref 6–20)
CALCIUM: 7.6 mg/dL — AB (ref 8.9–10.3)
CALCIUM: 7.7 mg/dL — AB (ref 8.9–10.3)
CHLORIDE: 106 mmol/L (ref 101–111)
CHLORIDE: 109 mmol/L (ref 101–111)
CO2: 22 mmol/L (ref 22–32)
CO2: 27 mmol/L (ref 22–32)
CO2: 25 mmol/L (ref 22–32)
CREATININE: 0.69 mg/dL (ref 0.44–1.00)
Calcium: 8 mg/dL — ABNORMAL LOW (ref 8.9–10.3)
Chloride: 109 mmol/L (ref 101–111)
Creatinine, Ser: 0.94 mg/dL (ref 0.44–1.00)
Creatinine, Ser: 0.62 mg/dL (ref 0.44–1.00)
GFR calc Af Amer: >60 mL/min (ref >60)
GFR calc Af Amer: >60 mL/min (ref >60)
GFR calc non Af Amer: >60 mL/min (ref >60)
GFR calc non Af Amer: >60 mL/min (ref >60)
GLUCOSE: 138 mg/dL — AB (ref 65–99)
Glucose, Bld: 135 mg/dL — ABNORMAL HIGH (ref 65–99)
Glucose, Bld: 170 mg/dL — ABNORMAL HIGH (ref 65–99)
POTASSIUM: 3 mmol/L — AB (ref 3.5–5.1)
POTASSIUM: 4.2 mmol/L (ref 3.5–5.1)
Potassium: 2.9 mmol/L — ABNORMAL LOW (ref 3.5–5.1)
SODIUM: 142 mmol/L (ref 135–145)
Sodium: 139 mmol/L (ref 135–145)
Sodium: 141 mmol/L (ref 135–145)

## 2015-07-19 LAB — POCT I-STAT 3, ART BLOOD GAS (G3+)
ACID-BASE DEFICIT: 2 mmol/L (ref 0.0–2.0)
Acid-base deficit: 2 mmol/L (ref 0.0–2.0)
Acid-base deficit: 1 mmol/L (ref 0.0–2.0)
BICARBONATE: 24.3 meq/L — AB (ref 20.0–24.0)
BICARBONATE: 25.8 meq/L — AB (ref 20.0–24.0)
Bicarbonate: 24.6 mEq/L — ABNORMAL HIGH (ref 20.0–24.0)
Bicarbonate: 27.8 mEq/L — ABNORMAL HIGH (ref 20.0–24.0)
O2 SAT: 92 %
O2 SAT: 92 %
O2 SAT: 83 %
O2 Saturation: 97 %
PCO2 ART: 40.7 mmHg (ref 35.0–45.0)
PCO2 ART: 56.1 mmHg — AB (ref 35.0–45.0)
PCO2 ART: 66.3 mmHg — AB (ref 35.0–45.0)
PH ART: 7.393 (ref 7.350–7.450)
PH ART: 7.343 — AB (ref 7.350–7.450)
PH ART: 7.232 — AB (ref 7.350–7.450)
PO2 ART: 66 mmHg — AB (ref 80.0–100.0)
PO2 ART: 67 mmHg — AB (ref 80.0–100.0)
Patient temperature: 98.2
TCO2: 26 mmol/L (ref 0–100)
TCO2: 26 mmol/L (ref 0–100)
TCO2: 28 mmol/L (ref 0–100)
TCO2: 30 mmol/L (ref 0–100)
pCO2 arterial: 44.7 mmHg (ref 35.0–45.0)
pH, Arterial: 7.27 — ABNORMAL LOW (ref 7.350–7.450)
pO2, Arterial: 54 mmHg — ABNORMAL LOW (ref 80.0–100.0)
pO2, Arterial: 110 mmHg — ABNORMAL HIGH (ref 80.0–100.0)

## 2015-07-19 LAB — GLUCOSE, CAPILLARY
GLUCOSE-CAPILLARY: 132 mg/dL — AB (ref 65–99)
GLUCOSE-CAPILLARY: 125 mg/dL — AB (ref 65–99)
GLUCOSE-CAPILLARY: 119 mg/dL — AB (ref 65–99)
GLUCOSE-CAPILLARY: 134 mg/dL — AB (ref 65–99)
Glucose-Capillary: 129 mg/dL — ABNORMAL HIGH (ref 65–99)

## 2015-07-19 LAB — PHOSPHORUS: Phosphorus: 2 mg/dL — ABNORMAL LOW (ref 2.5–4.6)

## 2015-07-19 LAB — PROCALCITONIN: Procalcitonin: 5.14 ng/mL

## 2015-07-19 LAB — LEGIONELLA ANTIGEN, URINE

## 2015-07-19 LAB — CBC
HEMATOCRIT: 32.8 % — AB (ref 36.0–46.0)
Hemoglobin: 10.8 g/dL — ABNORMAL LOW (ref 12.0–15.0)
MCH: 29.3 pg (ref 26.0–34.0)
MCHC: 32.9 g/dL (ref 30.0–36.0)
MCV: 88.9 fL (ref 78.0–100.0)
Platelets: 184 10*3/uL (ref 150–400)
RBC: 3.69 MIL/uL — ABNORMAL LOW (ref 3.87–5.11)
RDW: 13.9 % (ref 11.5–15.5)
WBC: 12.7 10*3/uL — AB (ref 4.0–10.5)

## 2015-07-19 LAB — CG4 I-STAT (LACTIC ACID): LACTIC ACID, VENOUS: 0.97 mmol/L (ref 0.5–2.0)

## 2015-07-19 LAB — HIV ANTIBODY (ROUTINE TESTING W REFLEX): HIV Screen 4th Generation wRfx: NONREACTIVE

## 2015-07-19 LAB — PROTIME-INR
INR: 1.3 (ref 0.00–1.49)
Prothrombin Time: 16.3 seconds — ABNORMAL HIGH (ref 11.6–15.2)

## 2015-07-19 LAB — APTT: APTT: 44 s — AB (ref 24–37)

## 2015-07-19 LAB — MAGNESIUM: Magnesium: 1.7 mg/dL (ref 1.7–2.4)

## 2015-07-19 MED ORDER — DEXTROSE 5 % IV SOLN
10.0000 mmol | Freq: Once | INTRAVENOUS | Status: AC
  Administered 2015-07-19: 10 mmol via INTRAVENOUS
  Filled 2015-07-19: qty 3.33

## 2015-07-19 MED ORDER — PRO-STAT SUGAR FREE PO LIQD
60.0000 mL | Freq: Four times a day (QID) | ORAL | Status: DC
  Administered 2015-07-19 – 2015-07-23 (×17): 60 mL
  Filled 2015-07-19 (×20): qty 60

## 2015-07-19 MED ORDER — VECURONIUM BROMIDE 10 MG IV SOLR
INTRAVENOUS | Status: AC
  Filled 2015-07-19: qty 10

## 2015-07-19 MED ORDER — DEXTROSE 5 % IV SOLN
1.0000 g | INTRAVENOUS | Status: DC
  Administered 2015-07-19 – 2015-07-20 (×2): 1 g via INTRAVENOUS
  Filled 2015-07-19 (×3): qty 10

## 2015-07-19 MED ORDER — HYDROCORTISONE NA SUCCINATE PF 100 MG IJ SOLR
50.0000 mg | Freq: Four times a day (QID) | INTRAMUSCULAR | Status: DC
  Administered 2015-07-19 – 2015-07-22 (×13): 50 mg via INTRAVENOUS
  Filled 2015-07-19 (×5): qty 1
  Filled 2015-07-19: qty 2
  Filled 2015-07-19: qty 1
  Filled 2015-07-19: qty 2
  Filled 2015-07-19 (×4): qty 1
  Filled 2015-07-19 (×2): qty 2
  Filled 2015-07-19: qty 1
  Filled 2015-07-19: qty 2
  Filled 2015-07-19: qty 1

## 2015-07-19 MED ORDER — FENTANYL CITRATE (PF) 100 MCG/2ML IJ SOLN: 100.0000 ug | Freq: Once | INTRAMUSCULAR | Status: DC

## 2015-07-19 MED ORDER — POTASSIUM CHLORIDE 20 MEQ/15ML (10%) PO SOLN
40.0000 meq | ORAL | Status: AC
  Administered 2015-07-19 (×2): 40 meq via ORAL
  Filled 2015-07-19 (×4): qty 30

## 2015-07-19 MED ORDER — VITAL HIGH PROTEIN PO LIQD
1000.0000 mL | ORAL | Status: DC
  Administered 2015-07-20: 20:00:00
  Administered 2015-07-20 – 2015-07-22 (×3): 1000 mL
  Filled 2015-07-19 (×5): qty 1000

## 2015-07-19 MED ORDER — FENTANYL CITRATE (PF) 100 MCG/2ML IJ SOLN: 100.0000 ug | Freq: Once | INTRAMUSCULAR | Status: DC | PRN

## 2015-07-19 MED ORDER — MAGNESIUM SULFATE 4 GM/100ML IV SOLN
4.0000 g | Freq: Once | INTRAVENOUS | Status: AC
  Administered 2015-07-19: 4 g via INTRAVENOUS
  Filled 2015-07-19: qty 100

## 2015-07-19 MED ORDER — VECURONIUM BROMIDE 10 MG IV SOLR
6.0000 mg | Freq: Once | INTRAVENOUS | Status: AC
  Administered 2015-07-19: 6 mg via INTRAVENOUS

## 2015-07-19 MED ORDER — FENTANYL BOLUS VIA INFUSION: 50.0000 ug | INTRAVENOUS | Status: DC | PRN

## 2015-07-19 MED ORDER — CISATRACURIUM BOLUS VIA INFUSION
0.0500 mg/kg | Freq: Once | INTRAVENOUS | Status: AC
Start: 2015-07-19 — End: 2015-07-19
  Administered 2015-07-19: 6.4 mg via INTRAVENOUS
  Filled 2015-07-19: qty 7

## 2015-07-19 MED ORDER — SODIUM CHLORIDE 0.9 % IV SOLN
3.0000 ug/kg/min | INTRAVENOUS | Status: DC
  Administered 2015-07-19: 3 ug/kg/min via INTRAVENOUS
  Administered 2015-07-20 (×2): 2 ug/kg/min via INTRAVENOUS
  Administered 2015-07-21: 3 ug/kg/min via INTRAVENOUS
  Filled 2015-07-19 (×4): qty 20

## 2015-07-19 MED ORDER — ARTIFICIAL TEARS OP OINT
1.0000 "application " | TOPICAL_OINTMENT | Freq: Three times a day (TID) | OPHTHALMIC | Status: DC
  Administered 2015-07-19 – 2015-07-22 (×9): 1 via OPHTHALMIC
  Filled 2015-07-19: qty 3.5

## 2015-07-19 NOTE — Progress Notes (Signed)
  Echocardiogram 2D Echocardiogram has been performed.  [name] 07/19/2015, 1:56 PM

## 2015-07-19 NOTE — Progress Notes (Signed)
PULMONARY / CRITICAL CARE MEDICINE   Name: Aizlyn Schifano MRN: [id number] DOB: 1971-04-17    ADMISSION DATE:  07/17/2015  REFERRING MD:  Lovie Macadamia EDP   CHIEF COMPLAINT:  Short of breath  SUBJECTIVE:  No paralysis, 70% peep 12 remains  VITAL SIGNS: BP 109/64 mmHg  Pulse 119  Temp(Src) 98.2 F (36.8 C) (Oral)  Resp 26  Ht 5' 7"  (1.702 m)  Wt 127.2 kg (280 lb 6.8 oz)  BMI 43.91 kg/m2  SpO2 93%  LMP  (LMP Unknown)  HEMODYNAMICS: CVP:  [9 mmHg-12 mmHg] 10 mmHg  VENTILATOR SETTINGS: Vent Mode:  [-] PRVC FiO2 (%):  [60 %-70 %] 70 % Set Rate:  [28 bmp-35 bmp] 35 bmp Vt Set:  [440 mL-500 mL] 450 mL PEEP:  [10 cmH20-12 cmH20] 12 cmH20 Plateau Pressure:  [27 cmH20-31 cmH20] 30 cmH20  INTAKE / OUTPUT: I/O last 3 completed shifts: In: 7293.5 [I.V.:5743.5; Other:10; NG/GT:1190; IV Piggyback:350] Out: 2985 [Urine:2985]  PHYSICAL EXAMINATION: General: sedated rass -3 Neuro: RASS -3 HEENT: ETT in place, pupils reactive 4 mm Cardiac: s1 s 2 regular, tachycardic Chest: coarse ronchi Abd: soft, non tender, no r/g Ext: 1+ edema Skin: no rashes  LABS:  BMET  Recent Labs Lab 07/17/15 1947 07/18/15 0503 07/18/15 2318  NA 136 138 139  K 4.3 4.3 3.0*  CL 109 110 109  CO2 17* 18* 22  BUN 25* 28* 16  CREATININE 1.34* 1.42* 0.94  GLUCOSE 119* 129* 138*    Electrolytes  Recent Labs Lab 07/17/15 1947 07/18/15 0503 07/18/15 2318  CALCIUM 7.0* 7.4* 7.6*    CBC  Recent Labs Lab 07/17/15 1645 07/18/15 0640 07/19/15 0500  WBC 11.2* 13.0* 12.7*  HGB 11.9* 12.0 10.8*  HCT 34.3* 36.4 32.8*  PLT 140* 194 184    Coag's No results for input(s): APTT, INR in the last 168 hours.  Sepsis Markers  Recent Labs Lab 07/17/15 1645 07/17/15 1947 07/18/15 0503 07/19/15 0500  LATICACIDVEN 3.0*  --   --   --   PROCALCITON  --  9.45 11.47 5.14    ABG  Recent Labs Lab 07/18/15 1831 07/18/15 2259 07/19/15 0400  PHART 7.275* 7.322* 7.393  PCO2ART 36.7 34.8*  40.7  PO2ART 71.0* 70.0* 66.0*    Liver Enzymes  Recent Labs Lab 07/17/15 1947 07/18/15 0503  AST 25 35  ALT 15 17  ALKPHOS 75 77  BILITOT 0.2* 0.6  ALBUMIN 2.5* 2.2*    Cardiac Enzymes No results for input(s): TROPONINI, PROBNP in the last 168 hours.  Glucose  Recent Labs Lab 07/18/15 1145 07/18/15 1621 07/18/15 2003 07/18/15 2321 07/19/15 0400 07/19/15 0804  GLUCAP 160* 143* 146* 128* 132* 125*    Imaging Dg Chest Port 1 View  07/19/2015  CLINICAL DATA:  45 year old female with pneumococcal pneumonia. Subsequent encounter. EXAM: PORTABLE CHEST 1 VIEW COMPARISON:  07/18/2015 FINDINGS: Endotracheal tube tip 4.2 cm above the carina. Left central line tip projects at the level of the proximal superior vena cava. No gross pneumothorax. Nasogastric tube courses below the diaphragm. Tip is not included on the present exam. Prominent asymmetric airspace disease greater on the left without significant change (taking into account slight differences in technique and position). Cardiomegaly. IMPRESSION: Diffuse asymmetric airspace disease greater on left without significant change. Electronically Signed   By: Genia Del M.D.   On: 07/19/2015 07:48     STUDIES:  12/31 CT chest >> Lobar consolidation LUL with widespread infiltrates 12/31 CT head >> negative  CULTURES: 12/31  Blood ([place]) >> 12/31 Pneumococcal Ag >> POSITIVE 12/31 Legionella Ag >> 01/01 HIV >>  ANTIBIOTICS: Vanc 12/31 >>2/1 Zosyn 12/31 >>2/1 Ceftriaxone 2/1>>>  SIGNIFICANT EVENTS: 12/31 Transfer from [place], [place], Sepsis 01/01 ARDS protocol  LINES/TUBES: 12/31 ETT >> 12/31 Lt IJ CVL >> 1/1 rt radial a line>>>  DISCUSSION: 45 yo female with septic shock, ARDS from pneumococcal lobar pneumonia.  ASSESSMENT / PLAN:  PULMONARY A: Acute hypoxic respiratory failure 2nd to CAP and ARDS (severe) P:   ARDS protocol, keeping plat less 30  At 7 cc/kg, from 8 thi sam  WILL SHOOT FOR PERMISSIVE  HYPERCAPNIA Requires proning today if not to 60% peep 10 Consider re paralysis unless improved fio2 as above  pcxr in am  No role steroids Even balance goals, needed, was pos 3 liters last 24 hrs  CARDIOVASCULAR A:  Septic shock. Hx of HTN. Borderline AI (21) P:  Levophed titrate for MAP > 65  F/u Echo pending cvp 10 noted , no further bolus If O2 needs worsen add lasix  Add stress roids for real IA Goal ph 7.20  RENAL A:   AKI. Non gap metabolic acidosis. Lactic acidosis. P:   May need to dc bicarb, await am chem F/u BMET pending  GASTROINTESTINAL A:   Nutrition. P:   Tube feeds while on vent Protonix for SUP cortrak as attempt post lylric as prone may be needed  HEMATOLOGIC A:   Leukocytosis. P:  F/u CBC SQ heparin for DVT prophylaxis coags in am   INFECTIOUS A:   Septic shock 2nd to Pneumococcal lobar pneumonia, no risk factor for res step pNA P:   Dc vancomycin, zosyn Add ceftriaxoner Check HIV, pending F/u procalcitonin , re assuring that pressors (unchnaged at 5 mic levo) are not related to infection  ENDOCRINE A:   REL AI likely  P:   Monitor CBG while on tube feeds  NEUROLOGIC A:   Acute encephalopathy 2nd to sepsis. Hx of Headaches, chronic back pain, anxiety, depression. P:   RASS goal -3  Hold home zanaflex/tramadol/mobic /neurontin/prozac  May need prone and paralysis, see resp  Ccm time 35 min  Lavon Paganini. Titus Mould, MD, [place] Pgr: [facility]

## 2015-07-19 NOTE — Progress Notes (Signed)
Utilization review completed.  

## 2015-07-19 NOTE — Progress Notes (Signed)
Patient placed in prone position per MD order.  Tube was noted at 22 at the lip prior to prone position.  Post prone, ETT noted to still be placed at 22 at the lip.  No complications during flip.  Will continue to monitor patient.

## 2015-07-19 NOTE — Progress Notes (Signed)
Initial Nutrition Assessment  DOCUMENTATION CODES:   Morbid obesity  INTERVENTION:    Initiate TF via OGT with Vital High Protein at goal rate of 5 ml/h (120 ml per day) with Prostat 60 ml QID to provide 920 kcals, 131 gm protein, 100 ml free water daily.  Total calorie intake with Propofol and TF will be 1902 kcals per day (107% of estimated needs).  NUTRITION DIAGNOSIS:   Inadequate oral intake related to inability to eat as evidenced by NPO status.  GOAL:   Provide needs based on ASPEN/SCCM guidelines  MONITOR:   Vent status, Labs, Weight trends, TF tolerance, I & O's  REASON FOR ASSESSMENT:   Consult Enteral/tube feeding initiation and management  ASSESSMENT:   45 yo WF smoker transferred to [facility] from [facility] ER. Being treated for septic shock, ARDS from pneumococcal lobar pneumonia.  Labs reviewed: potassium is low. Discussed patient with RN today. Plans to prone patient today. Cortrak tube to be placed post-pyloric for feedings. Patient to remain on propofol.   Patient is currently intubated on ventilator support Temp (24hrs), Avg:99.5 F (37.5 C), Min:98.2 F (36.8 C), Max:100.2 F (37.9 C)  Propofol: 37.2 ml/hr providing 982 kcals per day   Diet Order:  Diet NPO time specified  Skin:  Reviewed, no issues  Last BM:  unknown  Height:   Ht Readings from Last 1 Encounters:  07/17/15 5' 7"  (1.702 m)    Weight:   Wt Readings from Last 1 Encounters:  07/19/15 280 lb 6.8 oz (127.2 kg)    Ideal Body Weight:  61.4 kg  BMI:  Body mass index is 43.91 kg/(m^2).  Estimated Nutritional Needs:   Kcal:  2244-9753  Protein:  >/= 123 gm  Fluid:  1.8 L  EDUCATION NEEDS:   No education needs identified at this time  Molli Barrows, [place], [place], [place] Pager [phone] After Hours Pager [phone]

## 2015-07-20 ENCOUNTER — Encounter (HOSPITAL_COMMUNITY): Payer: Self-pay | Admitting: *Deleted

## 2015-07-20 ENCOUNTER — Inpatient Hospital Stay (HOSPITAL_COMMUNITY): Payer: Self-pay

## 2015-07-20 DIAGNOSIS — J80 Acute respiratory distress syndrome: Secondary | ICD-10-CM

## 2015-07-20 LAB — CBC WITH DIFFERENTIAL/PLATELET
Basophils Absolute: 0 10*3/uL (ref 0.0–0.1)
Basophils Relative: 0 %
EOS PCT: 0 %
Eosinophils Absolute: 0 10*3/uL (ref 0.0–0.7)
HCT: 35 % — ABNORMAL LOW (ref 36.0–46.0)
Hemoglobin: 11.3 g/dL — ABNORMAL LOW (ref 12.0–15.0)
LYMPHS ABS: 0.8 10*3/uL (ref 0.7–4.0)
LYMPHS PCT: 5 %
MCH: 29.4 pg (ref 26.0–34.0)
MCHC: 32.3 g/dL (ref 30.0–36.0)
MCV: 91.1 fL (ref 78.0–100.0)
MONO ABS: 1 10*3/uL (ref 0.1–1.0)
Monocytes Relative: 7 %
Neutro Abs: 13.8 10*3/uL — ABNORMAL HIGH (ref 1.7–7.7)
Neutrophils Relative %: 88 %
Platelets: 181 10*3/uL (ref 150–400)
RBC: 3.84 MIL/uL — AB (ref 3.87–5.11)
RDW: 14.3 % (ref 11.5–15.5)
WBC: 15.7 10*3/uL — AB (ref 4.0–10.5)

## 2015-07-20 LAB — POCT I-STAT 3, ART BLOOD GAS (G3+)
ACID-BASE EXCESS: 2 mmol/L (ref 0.0–2.0)
Acid-Base Excess: 6 mmol/L — ABNORMAL HIGH (ref 0.0–2.0)
BICARBONATE: 32.2 meq/L — AB (ref 20.0–24.0)
Bicarbonate: 29.4 mEq/L — ABNORMAL HIGH (ref 20.0–24.0)
O2 SAT: 95 %
O2 Saturation: 89 %
PCO2 ART: 63.4 mmHg — AB (ref 35.0–45.0)
PH ART: 7.282 — AB (ref 7.350–7.450)
TCO2: 31 mmol/L (ref 0–100)
TCO2: 34 mmol/L (ref 0–100)
pCO2 arterial: 61 mmHg (ref 35.0–45.0)
pH, Arterial: 7.339 — ABNORMAL LOW (ref 7.350–7.450)
pO2, Arterial: 94 mmHg (ref 80.0–100.0)
pO2, Arterial: 68 mmHg — ABNORMAL LOW (ref 80.0–100.0)

## 2015-07-20 LAB — CULTURE, RESPIRATORY W GRAM STAIN

## 2015-07-20 LAB — CULTURE, RESPIRATORY: CULTURE: NORMAL

## 2015-07-20 LAB — BASIC METABOLIC PANEL
Anion gap: 6 (ref 5–15)
BUN: 15 mg/dL (ref 6–20)
CHLORIDE: 119 mmol/L — AB (ref 101–111)
CO2: 24 mmol/L (ref 22–32)
Calcium: 6 mg/dL — CL (ref 8.9–10.3)
Creatinine, Ser: 0.54 mg/dL (ref 0.44–1.00)
GFR calc Af Amer: >60 mL/min (ref >60)
GFR calc non Af Amer: >60 mL/min (ref >60)
Glucose, Bld: 122 mg/dL — ABNORMAL HIGH (ref 65–99)
POTASSIUM: 2.4 mmol/L — AB (ref 3.5–5.1)
SODIUM: 149 mmol/L — AB (ref 135–145)

## 2015-07-20 LAB — COMPREHENSIVE METABOLIC PANEL
ALBUMIN: 1.9 g/dL — AB (ref 3.5–5.0)
ALT: 29 U/L (ref 14–54)
AST: 38 U/L (ref 15–41)
Alkaline Phosphatase: 159 U/L — ABNORMAL HIGH (ref 38–126)
Anion gap: 8 (ref 5–15)
BUN: 17 mg/dL (ref 6–20)
CHLORIDE: 109 mmol/L (ref 101–111)
CO2: 29 mmol/L (ref 22–32)
Calcium: 8.2 mg/dL — ABNORMAL LOW (ref 8.9–10.3)
Creatinine, Ser: 0.62 mg/dL (ref 0.44–1.00)
GFR calc Af Amer: >60 mL/min (ref >60)
GFR calc non Af Amer: >60 mL/min (ref >60)
GLUCOSE: 137 mg/dL — AB (ref 65–99)
POTASSIUM: 3.9 mmol/L (ref 3.5–5.1)
Sodium: 146 mmol/L — ABNORMAL HIGH (ref 135–145)
Total Bilirubin: 0.5 mg/dL (ref 0.3–1.2)
Total Protein: 5.7 g/dL — ABNORMAL LOW (ref 6.5–8.1)

## 2015-07-20 LAB — GLUCOSE, CAPILLARY
GLUCOSE-CAPILLARY: 132 mg/dL — AB (ref 65–99)
GLUCOSE-CAPILLARY: 146 mg/dL — AB (ref 65–99)
Glucose-Capillary: 124 mg/dL — ABNORMAL HIGH (ref 65–99)
Glucose-Capillary: 121 mg/dL — ABNORMAL HIGH (ref 65–99)
Glucose-Capillary: 97 mg/dL (ref 65–99)

## 2015-07-20 LAB — PHOSPHORUS: Phosphorus: 1.3 mg/dL — ABNORMAL LOW (ref 2.5–4.6)

## 2015-07-20 LAB — MAGNESIUM: Magnesium: 1.5 mg/dL — ABNORMAL LOW (ref 1.7–2.4)

## 2015-07-20 LAB — TRIGLYCERIDES: TRIGLYCERIDES: 287 mg/dL — AB (ref <150)

## 2015-07-20 MED ORDER — SODIUM CHLORIDE 0.9 % IV SOLN
1.0000 g | Freq: Once | INTRAVENOUS | Status: AC
  Administered 2015-07-20: 1 g via INTRAVENOUS
  Filled 2015-07-20: qty 10

## 2015-07-20 MED ORDER — MAGNESIUM SULFATE 2 GM/50ML IV SOLN
2.0000 g | Freq: Once | INTRAVENOUS | Status: AC
Start: 2015-07-20 — End: 2015-07-20
  Administered 2015-07-20: 2 g via INTRAVENOUS
  Filled 2015-07-20: qty 50

## 2015-07-20 MED ORDER — INFLUENZA VAC SPLIT QUAD 0.5 ML IM SUSY
0.5000 mL | PREFILLED_SYRINGE | INTRAMUSCULAR | Status: DC
  Filled 2015-07-20: qty 0.5

## 2015-07-20 MED ORDER — POTASSIUM CHLORIDE 20 MEQ/15ML (10%) PO SOLN
40.0000 meq | ORAL | Status: AC
  Administered 2015-07-20 – 2015-07-21 (×3): 40 meq via ORAL
  Filled 2015-07-20 (×6): qty 30

## 2015-07-20 MED ORDER — DEXTROSE 5 % IV SOLN
1.0000 g | Freq: Once | INTRAVENOUS | Status: AC
  Administered 2015-07-20: 1 g via INTRAVENOUS
  Filled 2015-07-20: qty 10

## 2015-07-20 MED ORDER — FUROSEMIDE 10 MG/ML IJ SOLN
40.0000 mg | Freq: Every day | INTRAMUSCULAR | Status: DC
  Administered 2015-07-21: 40 mg via INTRAVENOUS
  Filled 2015-07-20: qty 4

## 2015-07-20 MED ORDER — PNEUMOCOCCAL VAC POLYVALENT 25 MCG/0.5ML IJ INJ
0.5000 mL | INJECTION | INTRAMUSCULAR | Status: DC | PRN
Start: 2015-07-20 — End: 2015-08-09

## 2015-07-20 MED ORDER — PNEUMOCOCCAL VAC POLYVALENT 25 MCG/0.5ML IJ INJ: 0.5000 mL | INJECTION | INTRAMUSCULAR | Status: DC

## 2015-07-20 MED ORDER — DEXTROSE 5 % IV SOLN
2.0000 g | INTRAVENOUS | Status: DC
  Administered 2015-07-21 – 2015-07-22 (×2): 2 g via INTRAVENOUS
  Filled 2015-07-20 (×3): qty 2

## 2015-07-20 MED ORDER — FUROSEMIDE 10 MG/ML IJ SOLN
40.0000 mg | Freq: Three times a day (TID) | INTRAMUSCULAR | Status: DC
  Administered 2015-07-20: 40 mg via INTRAVENOUS
  Filled 2015-07-20 (×2): qty 4

## 2015-07-20 NOTE — Progress Notes (Signed)
CRITICAL VALUE ALERT  Critical value received:  K 2.4,Cal 6.0  Date of notification:  07/20/15  Time of notification:  1400  Critical value read back:yes  Nurse who received alert:  Marinus Maw  MD notified (1st page):  Luanne Bras, MD  Time of first page: At bedside 1550  MD notified (2nd page):  Time of second page:  Responding MD:  Luanne Bras, MD  Time MD responded:  At bedside 1550

## 2015-07-20 NOTE — Progress Notes (Signed)
Pt had transient wide QRS with potentially elevated T wave. EKG obtained. Resident MD at bedside and orders given to replace electrolytes. EKG in shadow chart as well as print-out of strip during odd cardiac rhythm.

## 2015-07-20 NOTE — Progress Notes (Signed)
EKG CRITICAL VALUE     12 lead EKG performed.  Critical value noted,Lauren, [employer] notified.   Yehuda Mao, [place] 07/20/2015 4:24 PM

## 2015-07-20 NOTE — Progress Notes (Signed)
Patient turned from prone position.  ETT noted at 22 at the lip prior to turning.  ETT was noted to still be at 22 at the lip post turning.  Per MD order, ETT was advanced to 2cm to 24 at the lip.  Will continue to monitor.

## 2015-07-20 NOTE — Progress Notes (Signed)
PULMONARY / CRITICAL CARE MEDICINE   Name: Cynthia Murray MRN: [id number] DOB: 1970-10-19    ADMISSION DATE:  07/17/2015  REFERRING MD:  Lovie Macadamia EDP   CHIEF COMPLAINT:  Short of breath  SUBJECTIVE:  Prone, on paralytics, FiO2 60%, PEEP 10. Off levophed  VITAL SIGNS: BP 136/73 mmHg  Pulse 132  Temp(Src) 100.6 F (38.1 C) (Oral)  Resp 35  Ht 5' 7"  (1.702 m)  Wt 281 lb 1.4 oz (127.5 kg)  BMI 44.01 kg/m2  SpO2 96%  LMP  (LMP Unknown)  HEMODYNAMICS: CVP:  [11 mmHg-13 mmHg] 12 mmHg  VENTILATOR SETTINGS: Vent Mode:  [-] PRVC FiO2 (%):  [60 %-100 %] 60 % Set Rate:  [35 bmp] 35 bmp Vt Set:  [380 mL] 380 mL PEEP:  [10 cmH20-14 cmH20] 10 cmH20 Plateau Pressure:  [23 cmH20-56 cmH20] 23 cmH20  INTAKE / OUTPUT: I/O last 3 completed shifts: In: 5748.9 [I.V.:3966.9; Other:10; NG/GT:890; IV Piggyback:882] Out: 2850 [Urine:2850]  PHYSICAL EXAMINATION: General: sedated, paralyzed rass -3 Neuro: RASS -3 HEENT: ETT in place, pupils reactive 4 mm Cardiac: s1 s 2 regular, tachycardic Chest: coarse rhonchi diffusely Abd: Unable to assess Ext: trace edema Skin: no rashes  LABS:  BMET  Recent Labs Lab 07/19/15 0930 07/19/15 1815 07/20/15 0300  NA 141 142 146*  K 2.9* 4.2 3.9  CL 106 109 109  CO2 27 25 29   BUN 12 13 17   CREATININE 0.69 0.62 0.62  GLUCOSE 135* 170* 137*    Electrolytes  Recent Labs Lab 07/19/15 0930 07/19/15 1815 07/20/15 0300  CALCIUM 7.7* 8.0* 8.2*  MG 1.7  --   --   PHOS 2.0*  --   --     CBC  Recent Labs Lab 07/18/15 0640 07/19/15 0500 07/20/15 0300  WBC 13.0* 12.7* 15.7*  HGB 12.0 10.8* 11.3*  HCT 36.4 32.8* 35.0*  PLT 194 184 181    Coag's  Recent Labs Lab 07/19/15 0930  APTT 44*  INR 1.30    Sepsis Markers  Recent Labs Lab 07/17/15 1645 07/17/15 1947 07/18/15 0503 07/19/15 0500 07/19/15 1507  LATICACIDVEN 3.0*  --   --   --  0.97  PROCALCITON  --  9.45 11.47 5.14  --     ABG  Recent Labs Lab  07/19/15 1006 07/19/15 1531 07/20/15 0456  PHART 7.270* 7.232* 7.282*  PCO2ART 56.1* 66.3* 63.4*  PO2ART 54.0* 110.0* 94.0    Liver Enzymes  Recent Labs Lab 07/17/15 1947 07/18/15 0503 07/20/15 0300  AST 25 35 38  ALT 15 17 29   ALKPHOS 75 77 159*  BILITOT 0.2* 0.6 0.5  ALBUMIN 2.5* 2.2* 1.9*     Glucose  Recent Labs Lab 07/19/15 0400 07/19/15 0804 07/19/15 1206 07/19/15 1532 07/19/15 2001 07/20/15 0250  GLUCAP 132* 125* 129* 119* 134* 124*    Imaging Dg Chest Port 1 View  07/20/2015  CLINICAL DATA:  Pneumonia, sepsis, acute respiratory failure EXAM: PORTABLE CHEST 1 VIEW COMPARISON:  Portable chest x-ray of July 19, 2015 FINDINGS: A large amount of soft tissue projects over both lungs. However, confluent alveolar opacities have worsened on the left. They are also more conspicuous at the right lung base. The cardiac silhouette is obscured. The endotracheal tube tip projects approximately 6 cm above the carina. The left internal jugular venous catheter tip projects over the junction of the right and left brachiocephalic veins. The feeding tube tip projects below the inferior margin of the image. IMPRESSION: Progressive alveolar opacities throughout the left lung  and at the right lung base consistent with worsening pneumonia or pulmonary edema. The patient's prone position may be contributing to the findings. The support tubes are in reasonable position. Electronically Signed   By: David  Martinique M.D.   On: 07/20/2015 07:22   Dg Abd Portable 1v  07/19/2015  CLINICAL DATA:  Feeding tube placement EXAM: PORTABLE ABDOMEN - 1 VIEW COMPARISON:  None. FINDINGS: 1221 hours. The tip of the feeding tube is identified in the descending portion of the duodenum. Bowel gas pattern is normal. IMPRESSION: Feeding tube tip is transpyloric. Electronically Signed   By: Misty Stanley M.D.   On: 07/19/2015 12:29     STUDIES:  12/31 CT chest >> Lobar consolidation LUL with widespread  infiltrates 12/31 CT head >> negative  CULTURES: 12/31 Blood ([facility]) >>  12/31 Pneumococcal Ag >> POSITIVE 12/31 Legionella Ag >> NEG 01/01 HIV >> NEG  ANTIBIOTICS: Vanc 12/31 >>2/1 Zosyn 12/31 >>2/1 Ceftriaxone 2/1>>>  SIGNIFICANT EVENTS: 12/31 Transfer from [place], [place], Sepsis 01/01 ARDS protocol 1/2 prone required  LINES/TUBES: 12/31 ETT >> 12/31 Lt IJ CVL >> 1/1 rt radial a line>>>  DISCUSSION: 45 yo female with septic shock, ARDS from pneumococcal lobar pneumonia.  ASSESSMENT / PLAN:  PULMONARY A: Acute hypoxic respiratory failure 2nd to CAP and ARDS (severe) P:   ARDS protocol, keeping plat less 30  At 6 cc/kg Permissive hypercapnea Prone to stop now Paralyzed keep for now pcxr in am  Even balance goals to negative, FACTT After supine, 4 hrs , abg If ph less 7.25, will add bicarb  CARDIOVASCULAR A:  Septic shock off pressors 1/3 Hx of HTN. P:  Levophed off lasix Goal ph 7.20  RENAL A:   AKI; resolved Retaining bicarb for co2 hypercapnia ARDS P:   BMP in AM and pm Lasix 40 iv tid Neg balance goal 2 liters  GASTROINTESTINAL A:   Nutrition, successful post pyloric feeding tube P:   Tube feeds while on vent Protonix for SUP Consider oxepa  HEMATOLOGIC A:   Leukocytosis. Improving P:  F/u CBC in AM SQ heparin for DVT prophylaxis   INFECTIOUS A:   Septic shock 2nd to Pneumococcal lobar pneumonia, no risk factor for res step pNA HIV negative Off pressors 1/2 P:   Continue ceftriaxone CBC in AM  ENDOCRINE A:   No issues P:   Monitor CBG while on tube feeds  NEUROLOGIC A:   Acute encephalopathy 2nd to sepsis. Hx of Headaches, chronic back pain, anxiety, depression. P:   RASS goal -5 Hold home zanaflex/tramadol/mobic /neurontin/prozac  Paralytics, re assess in afternoon ability to dc  Prop , fent to deep rass   Natasha Bence, MD PGY-3, Internal Medicine Pager: [phone]    STAFF NOTE: [name], MD  FACP have personally reviewed patient's available data, including medical history, events of note, physical examination and test results as part of my evaluation. I have discussed with resident/NP and other care providers such as pharmacist, RN and RRT. In addition, I personally evaluated patient and elicited key findings of: coarse BS on prone position, IMproved BP off pressors, slip to supine now, assess abg, appears that prone did improve oxygenation, await abg at 4 hrs, add lasix factt, goal cvp 4, kvo ensure, tolerating TF prone with post pyloric, chem in afternoon lasix, maintain current abx, NO WUA The patient is critically ill with multiple organ systems failure and requires high complexity decision making for assessment and support, frequent evaluation and titration of therapies, application  of advanced monitoring technologies and extensive interpretation of multiple databases.   Critical Care Time devoted to patient care services described in this note is 35 Minutes. This time reflects time of care of this signee: Merrie Roof, MD FACP. This critical care time does not reflect procedure time, or teaching time or supervisory time of PA/NP/Med student/Med Resident etc but could involve care discussion time. Rest per NP/medical resident whose note is outlined above and that I agree with   Lavon Paganini. Titus Mould, MD, [place] Pgr: [place] [facility] 07/20/2015 10:28 AM

## 2015-07-21 ENCOUNTER — Inpatient Hospital Stay (HOSPITAL_COMMUNITY): Payer: Self-pay

## 2015-07-21 DIAGNOSIS — J13 Pneumonia due to Streptococcus pneumoniae: Secondary | ICD-10-CM

## 2015-07-21 LAB — POCT I-STAT 3, ART BLOOD GAS (G3+)
Acid-Base Excess: 4 mmol/L — ABNORMAL HIGH (ref 0.0–2.0)
Acid-Base Excess: 8 mmol/L — ABNORMAL HIGH (ref 0.0–2.0)
BICARBONATE: 34.1 meq/L — AB (ref 20.0–24.0)
Bicarbonate: 30.9 mEq/L — ABNORMAL HIGH (ref 20.0–24.0)
O2 SAT: 89 %
O2 Saturation: 91 %
PCO2 ART: 55.7 mmHg — AB (ref 35.0–45.0)
PH ART: 7.354 (ref 7.350–7.450)
TCO2: 33 mmol/L (ref 0–100)
TCO2: 36 mmol/L (ref 0–100)
pCO2 arterial: 58.2 mmHg (ref 35.0–45.0)
pH, Arterial: 7.385 (ref 7.350–7.450)
pO2, Arterial: 66 mmHg — ABNORMAL LOW (ref 80.0–100.0)
pO2, Arterial: 65 mmHg — ABNORMAL LOW (ref 80.0–100.0)

## 2015-07-21 LAB — CBC
HCT: 32 % — ABNORMAL LOW (ref 36.0–46.0)
Hemoglobin: 10.3 g/dL — ABNORMAL LOW (ref 12.0–15.0)
MCH: 29.9 pg (ref 26.0–34.0)
MCHC: 32.2 g/dL (ref 30.0–36.0)
MCV: 93 fL (ref 78.0–100.0)
PLATELETS: 207 10*3/uL (ref 150–400)
RBC: 3.44 MIL/uL — AB (ref 3.87–5.11)
RDW: 14.6 % (ref 11.5–15.5)
WBC: 12.9 10*3/uL — AB (ref 4.0–10.5)

## 2015-07-21 LAB — BASIC METABOLIC PANEL
Anion gap: 8 (ref 5–15)
Anion gap: 9 (ref 5–15)
BUN: 26 mg/dL — ABNORMAL HIGH (ref 6–20)
BUN: 29 mg/dL — AB (ref 6–20)
CALCIUM: 8.3 mg/dL — AB (ref 8.9–10.3)
CALCIUM: 8.5 mg/dL — AB (ref 8.9–10.3)
CO2: 31 mmol/L (ref 22–32)
CO2: 34 mmol/L — AB (ref 22–32)
CREATININE: 0.57 mg/dL (ref 0.44–1.00)
CREATININE: 0.67 mg/dL (ref 0.44–1.00)
Chloride: 115 mmol/L — ABNORMAL HIGH (ref 101–111)
Chloride: 113 mmol/L — ABNORMAL HIGH (ref 101–111)
GFR calc non Af Amer: >60 mL/min (ref >60)
GFR calc non Af Amer: >60 mL/min (ref >60)
Glucose, Bld: 131 mg/dL — ABNORMAL HIGH (ref 65–99)
Glucose, Bld: 135 mg/dL — ABNORMAL HIGH (ref 65–99)
Potassium: 3.9 mmol/L (ref 3.5–5.1)
Potassium: 3.6 mmol/L (ref 3.5–5.1)
SODIUM: 154 mmol/L — AB (ref 135–145)
SODIUM: 156 mmol/L — AB (ref 135–145)

## 2015-07-21 LAB — GLUCOSE, CAPILLARY
GLUCOSE-CAPILLARY: 111 mg/dL — AB (ref 65–99)
GLUCOSE-CAPILLARY: 111 mg/dL — AB (ref 65–99)
Glucose-Capillary: 122 mg/dL — ABNORMAL HIGH (ref 65–99)
Glucose-Capillary: 128 mg/dL — ABNORMAL HIGH (ref 65–99)
Glucose-Capillary: 132 mg/dL — ABNORMAL HIGH (ref 65–99)

## 2015-07-21 LAB — MAGNESIUM
Magnesium: 2.3 mg/dL (ref 1.7–2.4)
Magnesium: 2 mg/dL (ref 1.7–2.4)

## 2015-07-21 LAB — PHOSPHORUS
PHOSPHORUS: 2.2 mg/dL — AB (ref 2.5–4.6)
PHOSPHORUS: 1.9 mg/dL — AB (ref 2.5–4.6)

## 2015-07-21 MED ORDER — FREE WATER
200.0000 mL | Freq: Four times a day (QID) | Status: DC
  Administered 2015-07-21 – 2015-07-25 (×16): 200 mL

## 2015-07-21 MED ORDER — POTASSIUM CHLORIDE 20 MEQ/15ML (10%) PO SOLN
40.0000 meq | Freq: Once | ORAL | Status: AC
  Administered 2015-07-21: 40 meq via ORAL
  Filled 2015-07-21 (×2): qty 30

## 2015-07-21 MED ORDER — INFLUENZA VAC SPLIT QUAD 0.5 ML IM SUSY: 0.5000 mL | PREFILLED_SYRINGE | INTRAMUSCULAR | Status: DC | PRN

## 2015-07-21 MED ORDER — DEXTROSE 5 % IV SOLN
INTRAVENOUS | Status: DC
  Administered 2015-07-21 – 2015-07-23 (×3): via INTRAVENOUS
  Administered 2015-07-24: 125 mL via INTRAVENOUS

## 2015-07-21 NOTE — Progress Notes (Signed)
PULMONARY / CRITICAL CARE MEDICINE   Name: Cynthia Murray MRN: [id number] DOB: 1971-02-02    ADMISSION DATE:  07/17/2015  REFERRING MD:  Lovie Macadamia EDP   CHIEF COMPLAINT:  Short of breath  SUBJECTIVE:  Still on paralytics, vent setting improved, FiO2 50%, PEEP 8. Still tachycardic. Loose stools overnight, rectal tube placed.   VITAL SIGNS: BP 118/71 mmHg  Pulse 107  Temp(Src) 99.5 F (37.5 C) (Oral)  Resp 35  Ht 5' 7"  (1.702 m)  Wt 276 lb 10.8 oz (125.5 kg)  BMI 43.32 kg/m2  SpO2 96%  LMP  (LMP Unknown)  HEMODYNAMICS: CVP:  [7 mmHg-17 mmHg] 7 mmHg  VENTILATOR SETTINGS: Vent Mode:  [-] PRVC FiO2 (%):  [50 %] 50 % Set Rate:  [35 bmp] 35 bmp Vt Set:  [380 mL] 380 mL PEEP:  [10 cmH20] 10 cmH20 Plateau Pressure:  [18 cmH20-27 cmH20] 26 cmH20  INTAKE / OUTPUT: I/O last 3 completed shifts: In: 3335.3 [I.V.:2311.3; NG/GT:550; IV Piggyback:474] Out: 2542 [Urine:4765]  PHYSICAL EXAMINATION: General: Sedated, paralyzed rass -5 Neuro: RASS -5 HEENT: ETT in place, pupils reactive 4 mm Cardiac: s1 s 2 regular, tachycardic Chest: coarse rhonchi diffusely Abd: Unable to assess Ext: trace edema Skin: no rashes  LABS:  BMET  Recent Labs Lab 07/20/15 0300 07/20/15 1355 07/21/15 0240  NA 146* 149* 154*  K 3.9 2.4* 3.9  CL 109 119* 115*  CO2 29 24 31   BUN 17 15 26*  CREATININE 0.62 0.54 0.57  GLUCOSE 137* 122* 131*    Electrolytes  Recent Labs Lab 07/19/15 0930  07/20/15 0300 07/20/15 1355 07/21/15 0240  CALCIUM 7.7*  < > 8.2* 6.0* 8.3*  MG 1.7  --   --  1.5* 2.3  PHOS 2.0*  --   --  1.3* 2.2*  < > = values in this interval not displayed.  CBC  Recent Labs Lab 07/19/15 0500 07/20/15 0300 07/21/15 0500  WBC 12.7* 15.7* 12.9*  HGB 10.8* 11.3* 10.3*  HCT 32.8* 35.0* 32.0*  PLT 184 181 207    Coag's  Recent Labs Lab 07/19/15 0930  APTT 44*  INR 1.30    Sepsis Markers  Recent Labs Lab 07/17/15 1645 07/17/15 1947 07/18/15 0503  07/19/15 0500 07/19/15 1507  LATICACIDVEN 3.0*  --   --   --  0.97  PROCALCITON  --  9.45 11.47 5.14  --     ABG  Recent Labs Lab 07/20/15 0456 07/20/15 1513 07/21/15 0248  PHART 7.282* 7.339* 7.354  PCO2ART 63.4* 61.0* 55.7*  PO2ART 94.0 68.0* 66.0*    Liver Enzymes  Recent Labs Lab 07/17/15 1947 07/18/15 0503 07/20/15 0300  AST 25 35 38  ALT 15 17 29   ALKPHOS 75 77 159*  BILITOT 0.2* 0.6 0.5  ALBUMIN 2.5* 2.2* 1.9*     Glucose  Recent Labs Lab 07/20/15 0250 07/20/15 0754 07/20/15 1129 07/20/15 1544 07/20/15 1934 07/20/15 2351  GLUCAP 124* 121* 132* 97 146* 132*    Imaging Dg Chest Port 1 View  07/21/2015  CLINICAL DATA:  ARDS EXAM: PORTABLE CHEST 1 VIEW COMPARISON:  Portable chest x-ray of July 20, 2015 FINDINGS: The lungs remain hypoinflated. Confluent alveolar opacity on the left has markedly improved. There has also been some improvement on the right. The interstitial markings remain increased. There is no large pleural effusion and no pneumothorax. The visualized portions of the cardiac silhouette are normal in size. The endotracheal tube tip lies 2.6 cm above the carina. The Doppler pop type  feeding tube tip projects below the inferior margin of the image. The left internal jugular venous catheter tip projects at the junction of the right and left brachiocephalic veins. IMPRESSION: Interval decrease in alveolar opacities consistent with resolving edema, pneumonia, or ARDS. Electronically Signed   By: David  Martinique M.D.   On: 07/21/2015 07:31     STUDIES:  12/31 CT chest >> Lobar consolidation LUL with widespread infiltrates 12/31 CT head >> negative 1/4 CXR >> improved  CULTURES: 12/31 Blood ([facility]) >>  12/31 Pneumococcal Ag >> POSITIVE 12/31 Legionella Ag >> NEG 01/01 HIV >> NEG  ANTIBIOTICS: Vanc 12/31 >>2/1 Zosyn 12/31 >>2/1 Ceftriaxone 2/1 >>>   SIGNIFICANT EVENTS: 12/31 Transfer from [place], [place], Sepsis 01/01 ARDS protocol 1/2  Prone required 1/3 Supine  LINES/TUBES: 12/31 ETT >> 12/31 Lt IJ CVL >> 1/1 rt radial a line>>>  DISCUSSION: 45 yo female with septic shock, ARDS from pneumococcal lobar pneumonia.  ASSESSMENT / PLAN:  PULMONARY A: Acute hypoxic respiratory failure 2nd to CAP and ARDS (severe) P:   ARDS protocol, keeping plat less 30  At 6 cc/kg, maintain Permissive hypercapnea if needed Stop paralytics Continue Lasix 40 daily  CARDIOVASCULAR A:  Septic shock off pressors 1/3 Hx of HTN Prolonged QTc Tachycardia P:  Continue Lasix 40 daily   RENAL A:  Hyperenatremia  Retaining bicarb for co2 hypercapnia ARDS P:   Add free water via NGT 200 mL q6h, add d5w BMP in AM and pm Lasix Neg balance goal 1  GASTROINTESTINAL A:   Nutrition, successful post pyloric feeding tube, BM noted P:   Tube feeds while on vent Protonix for SUP  HEMATOLOGIC A:   Leukocytosis. Improving Normocytic Anemia of critical illness P:  SQ heparin for DVT prophylaxis   INFECTIOUS A:   Septic shock 2nd to Pneumococcal lobar pneumonia, no risk factor for res step pNA P:   Continue ceftriaxone Will call [facility] to update cultures  ENDOCRINE A:   No issues P:   Monitor CBG while on tube feeds  NEUROLOGIC A:   Acute encephalopathy 2nd to sepsis Hx of Headaches, chronic back pain, anxiety, depression P:  Stop paralytics  Hold home zanaflex/tramadol/mobic/neurontin/prozac  Prop, fent to RASS goal -2   Natasha Bence, MD PGY-3, Internal Medicine Pager: [phone]   STAFF NOTE: [name], MD FACP have personally reviewed patient's available data, including medical history, events of note, physical examination and test results as part of my evaluation. I have discussed with resident/NP and other care providers such as pharmacist, RN and RRT. In addition, I personally evaluated patient and elicited key findings of: MUCH improved, pcxr improved, lungs resolving to clear anterior, would  dc paralysis, rass goal to -2, goal to 40% then peep 5, Na noted, add free water d5w, na in pm, lasix maintain, PH noted, may  Drop rate in afternoon if agitation is an issue, will call [facility] for cultures, upright, no prone needed, no steroids needed The patient is critically ill with multiple organ systems failure and requires high complexity decision making for assessment and support, frequent evaluation and titration of therapies, application of advanced monitoring technologies and extensive interpretation of multiple databases.   Critical Care Time devoted to patient care services described in this note is35 nutes. This time reflects time of care of this signee: Merrie Roof, MD FACP. This critical care time does not reflect procedure time, or teaching time or supervisory time of PA/NP/Med student/Med Resident etc but could involve care discussion time.  Rest per NP/medical resident whose note is outlined above and that I agree with   Lavon Paganini. Titus Mould, MD, [place] Pgr: [place] [facility] 07/21/2015 10:10 AM

## 2015-07-21 NOTE — Progress Notes (Signed)
Pharmacy Documentation  Spoke with [facility] microbiology lab. Technician stated that culture from 12/31 is no growth to date in both bottles. They did not have any additional cultures.  Called patient's outpatient pharmacy to confirm dosing of psychiatric medications. Patient is prescribed fluoxetine 40 mg BID, Buspar 10 mg BID and gabapentin 900 mg QID. She also have clonazepam, tramadol and amitriptyline that she uses as needed.  Dimitri Ped, PharmD. PGY-1 Pharmacy Resident Pager: [phone] 07/21/2015, 1:30 PM

## 2015-07-22 ENCOUNTER — Inpatient Hospital Stay (HOSPITAL_COMMUNITY): Payer: Self-pay

## 2015-07-22 LAB — POCT I-STAT 3, ART BLOOD GAS (G3+)
Acid-Base Excess: 7 mmol/L — ABNORMAL HIGH (ref 0.0–2.0)
Bicarbonate: 34.4 mEq/L — ABNORMAL HIGH (ref 20.0–24.0)
O2 SAT: 89 %
PCO2 ART: 63.8 mmHg — AB (ref 35.0–45.0)
PH ART: 7.34 — AB (ref 7.350–7.450)
PO2 ART: 62 mmHg — AB (ref 80.0–100.0)
Patient temperature: 99
TCO2: 36 mmol/L (ref 0–100)

## 2015-07-22 LAB — MAGNESIUM
Magnesium: 2.1 mg/dL (ref 1.7–2.4)
Magnesium: 2.1 mg/dL (ref 1.7–2.4)

## 2015-07-22 LAB — CBC
HCT: 35.1 % — ABNORMAL LOW (ref 36.0–46.0)
Hemoglobin: 10.9 g/dL — ABNORMAL LOW (ref 12.0–15.0)
MCH: 29.3 pg (ref 26.0–34.0)
MCHC: 31.1 g/dL (ref 30.0–36.0)
MCV: 94.4 fL (ref 78.0–100.0)
PLATELETS: 216 10*3/uL (ref 150–400)
RBC: 3.72 MIL/uL — ABNORMAL LOW (ref 3.87–5.11)
RDW: 14.9 % (ref 11.5–15.5)
WBC: 13.9 10*3/uL — ABNORMAL HIGH (ref 4.0–10.5)

## 2015-07-22 LAB — BASIC METABOLIC PANEL
Anion gap: 9 (ref 5–15)
Anion gap: 9 (ref 5–15)
BUN: 32 mg/dL — AB (ref 6–20)
BUN: 27 mg/dL — ABNORMAL HIGH (ref 6–20)
CALCIUM: 8.4 mg/dL — AB (ref 8.9–10.3)
CALCIUM: 8.4 mg/dL — AB (ref 8.9–10.3)
CHLORIDE: 113 mmol/L — AB (ref 101–111)
CHLORIDE: 112 mmol/L — AB (ref 101–111)
CO2: 33 mmol/L — AB (ref 22–32)
CO2: 33 mmol/L — AB (ref 22–32)
CREATININE: 0.54 mg/dL (ref 0.44–1.00)
CREATININE: 0.59 mg/dL (ref 0.44–1.00)
GFR calc non Af Amer: >60 mL/min (ref >60)
GFR calc non Af Amer: >60 mL/min (ref >60)
GLUCOSE: 141 mg/dL — AB (ref 65–99)
GLUCOSE: 118 mg/dL — AB (ref 65–99)
Potassium: 3.3 mmol/L — ABNORMAL LOW (ref 3.5–5.1)
Potassium: 3.5 mmol/L (ref 3.5–5.1)
Sodium: 155 mmol/L — ABNORMAL HIGH (ref 135–145)
Sodium: 154 mmol/L — ABNORMAL HIGH (ref 135–145)

## 2015-07-22 LAB — URINALYSIS, ROUTINE W REFLEX MICROSCOPIC
BILIRUBIN URINE: NEGATIVE
Glucose, UA: NEGATIVE mg/dL
Hgb urine dipstick: NEGATIVE
KETONES UR: NEGATIVE mg/dL
Leukocytes, UA: NEGATIVE
NITRITE: NEGATIVE
PROTEIN: 30 mg/dL — AB
Specific Gravity, Urine: 1.024 (ref 1.005–1.030)
pH: 6 (ref 5.0–8.0)

## 2015-07-22 LAB — GLUCOSE, CAPILLARY
GLUCOSE-CAPILLARY: 121 mg/dL — AB (ref 65–99)
GLUCOSE-CAPILLARY: 104 mg/dL — AB (ref 65–99)
Glucose-Capillary: 136 mg/dL — ABNORMAL HIGH (ref 65–99)
Glucose-Capillary: 136 mg/dL — ABNORMAL HIGH (ref 65–99)
Glucose-Capillary: 126 mg/dL — ABNORMAL HIGH (ref 65–99)
Glucose-Capillary: 102 mg/dL — ABNORMAL HIGH (ref 65–99)

## 2015-07-22 LAB — URINE MICROSCOPIC-ADD ON: RBC / HPF: NONE SEEN RBC/hpf (ref 0–5)

## 2015-07-22 LAB — PHOSPHORUS
PHOSPHORUS: 3.2 mg/dL (ref 2.5–4.6)
PHOSPHORUS: 2.9 mg/dL (ref 2.5–4.6)

## 2015-07-22 MED ORDER — POTASSIUM CHLORIDE 10 MEQ/50ML IV SOLN
10.0000 meq | INTRAVENOUS | Status: AC
  Administered 2015-07-22 (×4): 10 meq via INTRAVENOUS
  Filled 2015-07-22 (×4): qty 50

## 2015-07-22 MED ORDER — HYDROCORTISONE NA SUCCINATE PF 100 MG IJ SOLR
50.0000 mg | Freq: Every day | INTRAMUSCULAR | Status: AC
  Administered 2015-07-24: 50 mg via INTRAVENOUS
  Filled 2015-07-22: qty 1

## 2015-07-22 MED ORDER — HYDROCORTISONE NA SUCCINATE PF 100 MG IJ SOLR
50.0000 mg | Freq: Three times a day (TID) | INTRAMUSCULAR | Status: AC
  Administered 2015-07-22 – 2015-07-23 (×3): 50 mg via INTRAVENOUS

## 2015-07-22 MED ORDER — HYDROCORTISONE NA SUCCINATE PF 100 MG IJ SOLR
50.0000 mg | Freq: Two times a day (BID) | INTRAMUSCULAR | Status: AC
  Administered 2015-07-23 – 2015-07-24 (×2): 50 mg via INTRAVENOUS
  Filled 2015-07-22 (×2): qty 1
  Filled 2015-07-22: qty 2

## 2015-07-22 NOTE — Progress Notes (Signed)
Dr. Titus Mould called me to bedside to place pt on vent wean on cp/ps 8 peep, 50% fio2 and PS of at least 10-12  (per MD).  Pt tol vent change well, no distress noted. RN and MD aware.

## 2015-07-22 NOTE — Progress Notes (Addendum)
PULMONARY / CRITICAL CARE MEDICINE   Name: Cynthia Murray MRN: [id number] DOB: August 12, 1970    ADMISSION DATE:  07/17/2015  REFERRING MD:  Lovie Macadamia EDP   CHIEF COMPLAINT:  Short of breath  SUBJECTIVE:  Fevers overnight, still tachycardic. Decreased rate and increased tidal volume on ventilator yesterday. Still requiring heavy sedation.   VITAL SIGNS: BP 123/79 mmHg  Pulse 122  Temp(Src) 99 F (37.2 C) (Oral)  Resp 22  Ht 5' 7"  (1.702 m)  Wt 275 lb 9.2 oz (125 kg)  BMI 43.15 kg/m2  SpO2 92%  LMP  (LMP Unknown)  HEMODYNAMICS: CVP:  [7 mmHg-13 mmHg] 13 mmHg  VENTILATOR SETTINGS: Vent Mode:  [-] PRVC FiO2 (%):  [50 %] 50 % Set Rate:  [22 bmp-35 bmp] 22 bmp Vt Set:  [380 mL-450 mL] 450 mL PEEP:  [8 cmH20] 8 cmH20 Plateau Pressure:  [20 WNU27-25 cmH20] 24 cmH20  INTAKE / OUTPUT: I/O last 3 completed shifts: In: 4940.3 [I.V.:4105.3; NG/GT:585; IV Piggyback:250] Out: 3664 [Urine:5875]  PHYSICAL EXAMINATION: General: Sedated, intubated.  Neuro: RASS -2 HEENT: ETT in place, pupils reactive 4 mm Cardiac: S1/S2 regular, tachycardic Chest: Coarse rhonchi diffusely Abd: Soft, non-tender, non-distended, BS present.  Ext: +1 pitting edema.  Skin: No rashes  LABS:  BMET  Recent Labs Lab 07/21/15 0240 07/21/15 1300 07/22/15 0214  NA 154* 156* 155*  K 3.9 3.6 3.3*  CL 115* 113* 113*  CO2 31 34* 33*  BUN 26* 29* 32*  CREATININE 0.57 0.67 0.54  GLUCOSE 131* 135* 141*    Electrolytes  Recent Labs Lab 07/21/15 0240 07/21/15 1300 07/22/15 0214  CALCIUM 8.3* 8.5* 8.4*  MG 2.3 2.0 2.1  PHOS 2.2* 1.9* 3.2    CBC  Recent Labs Lab 07/20/15 0300 07/21/15 0500 07/22/15 0426  WBC 15.7* 12.9* 13.9*  HGB 11.3* 10.3* 10.9*  HCT 35.0* 32.0* 35.1*  PLT 181 207 216    Coag's  Recent Labs Lab 07/19/15 0930  APTT 44*  INR 1.30    Sepsis Markers  Recent Labs Lab 07/17/15 1645 07/17/15 1947 07/18/15 0503 07/19/15 0500 07/19/15 1507  LATICACIDVEN  3.0*  --   --   --  0.97  PROCALCITON  --  9.45 11.47 5.14  --     ABG  Recent Labs Lab 07/21/15 0248 07/21/15 1543 07/22/15 0426  PHART 7.354 7.385 7.340*  PCO2ART 55.7* 58.2* 63.8*  PO2ART 66.0* 65.0* 62.0*    Liver Enzymes  Recent Labs Lab 07/17/15 1947 07/18/15 0503 07/20/15 0300  AST 25 35 38  ALT 15 17 29   ALKPHOS 75 77 159*  BILITOT 0.2* 0.6 0.5  ALBUMIN 2.5* 2.2* 1.9*     Glucose  Recent Labs Lab 07/20/15 2351 07/21/15 0832 07/21/15 1121 07/21/15 1537 07/21/15 1936 07/21/15 2328  GLUCAP 132* 111* 128* 111* 122* 136*    Imaging Dg Chest Port 1 View  07/22/2015  CLINICAL DATA:  Respiratory failure.  Shortness breath. EXAM: PORTABLE CHEST - 1 VIEW COMPARISON:  One-view chest x-ray 07/21/2015 FINDINGS: The heart is enlarged. Diffuse edema is not significantly changed. Left pleural effusion is suspected. The endotracheal tube is stable at 2.1 cm above the carina. A left IJ line is in place. A feeding tube courses off the inferior border of the film. The lung volumes remain low. IMPRESSION: 1. Similar appearance of low lung volumes and diffuse interstitial and airspace disease. This may represent edema, pneumonia, or ARDS. 2. Support apparatus is stable. Electronically Signed   By: San Morelle  M.D.   On: 07/22/2015 06:55     STUDIES:  12/31 CT chest >> Lobar consolidation LUL with widespread infiltrates 12/31 CT head >> negative 1/4 CXR >> improved  CULTURES: 12/31 Blood ([facility]) >> NEG 12/31 Pneumococcal Ag >> POSITIVE 12/31 Legionella Ag >> NEG 12/31 Sputum >> NEG 01/01 HIV >> NEG 01/05 Sputum >>  01/05 Blood >>   ANTIBIOTICS: Vanc 12/31 >>2/1 Zosyn 12/31 >>2/1 Ceftriaxone 2/1 >>>   SIGNIFICANT EVENTS: 12/31 Transfer from [place], [place], Sepsis 01/01 ARDS protocol 1/2 Prone required 1/3 Supine  LINES/TUBES: 12/31 ETT >> 12/31 Lt IJ CVL >> 1/1 rt radial a line>>>  DISCUSSION: 45 yo female with septic shock, ARDS from  pneumococcal lobar pneumonia.  ASSESSMENT / PLAN:  PULMONARY A: Acute hypoxic respiratory failure 2nd to CAP and ARDS (severe) Improved plat 1/4 P:   ARDS protocol Continue at 7 cc/kg, rate 22  Hold Lasix for now given hypernatremia No further paralysis needed Goal to peep 5 if able, consider cpap 5-8 ps 10-12   CARDIOVASCULAR A:  Septic shock off pressors 07/20/15 HTN Prolonged QTc Tachycardia, asymptomatic P:  Hold Lasix for now Tolerating HR less 135 May bolus and assess response Reduce steroids to off over 3 days  RENAL A:  Hyperenatremia  Retaining bicarb for co2 hypercapnea ARDS hypok P:   Continue free water via NGT 200 mL q6h Continue D5W to 150 BMP q12h Hold Lasix Even goals k supp  GASTROINTESTINAL A:   Nutrition P:   Tube feeds  Protonix  Assess last BM  HEMATOLOGIC A:   Leukocytosis Normocytic Anemia of critical illness hemoconcentration fo rsure P:  SQ heparin for DVT prophylaxis   INFECTIOUS A:   Septic shock 2nd to Pneumococcal lobar pneumonia, no risk factor for res step pNA Fever new P:   Continue ceftriaxone Repeat blood and hold sputum cultures (vent better) Dc a line, low threshold to dc all lines If repeat fever over 101.5, would add vanc, change to ceftaz  ENDOCRINE A:   No issues P:   Monitor CBG while on tube feeds  NEUROLOGIC A:   Acute encephalopathy 2nd to sepsis H/o of headaches, chronic back pain, anxiety, depression P:  Hold home zanaflex/tramadol/mobic/neurontin/prozac  Prop, fent to RASS goal -2   Natasha Bence, MD PGY-3, Internal Medicine Pager: [phone]   STAFF NOTE: [name], MD FACP have personally reviewed patient's available data, including medical history, events of note, physical examination and test results as part of my evaluation. I have discussed with resident/NP and other care providers such as pharmacist, RN and RRT. In addition, I personally evaluated patient and elicited key  findings of: less dyschrony, coarse but improved compared to Monday BS, no abdo ain, abg reviewed, NO role rat eincrease, attempt cpap 8 ps 10-12, fever, UA, BC, may need to dc lines, increase free water, WUA active SBT, wake up and breath, PLAt better on current TV , she has benefited frmo some liberalization, sister on family presence on rounds, will need repeat echo for pa pressures in future, to vanc, ceftaz if fevers continued The patient is critically ill with multiple organ systems failure and requires high complexity decision making for assessment and support, frequent evaluation and titration of therapies, application of advanced monitoring technologies and extensive interpretation of multiple databases.   Critical Care Time devoted to patient care services described in this note is 30 Minutes. This time reflects time of care of this signee: Merrie Roof, MD FACP. This critical care time  does not reflect procedure time, or teaching time or supervisory time of PA/NP/Med student/Med Resident etc but could involve care discussion time. Rest per NP/medical resident whose note is outlined above and that I agree with   Lavon Paganini. Titus Mould, MD, [place] Pgr: [place] [facility] 07/22/2015 9:34 AM

## 2015-07-23 ENCOUNTER — Inpatient Hospital Stay (HOSPITAL_COMMUNITY): Payer: Self-pay

## 2015-07-23 LAB — BASIC METABOLIC PANEL
ANION GAP: 9 (ref 5–15)
ANION GAP: 13 (ref 5–15)
BUN: 24 mg/dL — ABNORMAL HIGH (ref 6–20)
BUN: 27 mg/dL — AB (ref 6–20)
CO2: 34 mmol/L — AB (ref 22–32)
CO2: 32 mmol/L (ref 22–32)
CREATININE: 0.53 mg/dL (ref 0.44–1.00)
Calcium: 8.7 mg/dL — ABNORMAL LOW (ref 8.9–10.3)
Calcium: 9.2 mg/dL (ref 8.9–10.3)
Chloride: 110 mmol/L (ref 101–111)
Chloride: 102 mmol/L (ref 101–111)
Creatinine, Ser: 0.51 mg/dL (ref 0.44–1.00)
GFR calc Af Amer: >60 mL/min (ref >60)
GFR calc non Af Amer: >60 mL/min (ref >60)
GLUCOSE: 138 mg/dL — AB (ref 65–99)
Glucose, Bld: 141 mg/dL — ABNORMAL HIGH (ref 65–99)
POTASSIUM: 3.2 mmol/L — AB (ref 3.5–5.1)
Potassium: 3.4 mmol/L — ABNORMAL LOW (ref 3.5–5.1)
SODIUM: 147 mmol/L — AB (ref 135–145)
Sodium: 153 mmol/L — ABNORMAL HIGH (ref 135–145)

## 2015-07-23 LAB — GLUCOSE, CAPILLARY
GLUCOSE-CAPILLARY: 112 mg/dL — AB (ref 65–99)
GLUCOSE-CAPILLARY: 124 mg/dL — AB (ref 65–99)
Glucose-Capillary: 131 mg/dL — ABNORMAL HIGH (ref 65–99)
Glucose-Capillary: 111 mg/dL — ABNORMAL HIGH (ref 65–99)
Glucose-Capillary: 146 mg/dL — ABNORMAL HIGH (ref 65–99)
Glucose-Capillary: 146 mg/dL — ABNORMAL HIGH (ref 65–99)

## 2015-07-23 LAB — CBC WITH DIFFERENTIAL/PLATELET
BASOS PCT: 0 %
Basophils Absolute: 0 10*3/uL (ref 0.0–0.1)
EOS PCT: 4 %
Eosinophils Absolute: 0.5 10*3/uL (ref 0.0–0.7)
HCT: 34.9 % — ABNORMAL LOW (ref 36.0–46.0)
HEMOGLOBIN: 10.9 g/dL — AB (ref 12.0–15.0)
LYMPHS PCT: 26 %
Lymphs Abs: 3.4 10*3/uL (ref 0.7–4.0)
MCH: 29.9 pg (ref 26.0–34.0)
MCHC: 31.2 g/dL (ref 30.0–36.0)
MCV: 95.6 fL (ref 78.0–100.0)
MONOS PCT: 4 %
Monocytes Absolute: 0.5 10*3/uL (ref 0.1–1.0)
NEUTROS PCT: 66 %
Neutro Abs: 8.8 10*3/uL — ABNORMAL HIGH (ref 1.7–7.7)
Platelets: 231 10*3/uL (ref 150–400)
RBC: 3.65 MIL/uL — ABNORMAL LOW (ref 3.87–5.11)
RDW: 14.8 % (ref 11.5–15.5)
WBC: 13.2 10*3/uL — ABNORMAL HIGH (ref 4.0–10.5)

## 2015-07-23 LAB — MAGNESIUM: Magnesium: 2.2 mg/dL (ref 1.7–2.4)

## 2015-07-23 LAB — PHOSPHORUS: PHOSPHORUS: 2.7 mg/dL (ref 2.5–4.6)

## 2015-07-23 LAB — TRIGLYCERIDES: Triglycerides: 409 mg/dL — ABNORMAL HIGH (ref <150)

## 2015-07-23 MED ORDER — MIDAZOLAM HCL 2 MG/2ML IJ SOLN: 2.0000 mg | INTRAMUSCULAR | Status: DC | PRN

## 2015-07-23 MED ORDER — POTASSIUM CHLORIDE 10 MEQ/50ML IV SOLN
10.0000 meq | INTRAVENOUS | Status: AC
  Administered 2015-07-23 (×4): 10 meq via INTRAVENOUS
  Filled 2015-07-23 (×4): qty 50

## 2015-07-23 MED ORDER — VANCOMYCIN HCL IN DEXTROSE 1-5 GM/200ML-% IV SOLN
1000.0000 mg | Freq: Three times a day (TID) | INTRAVENOUS | Status: DC
  Administered 2015-07-23 – 2015-07-25 (×6): 1000 mg via INTRAVENOUS
  Filled 2015-07-23 (×8): qty 200

## 2015-07-23 MED ORDER — DEXTROSE 5 % IV SOLN
2.0000 g | Freq: Three times a day (TID) | INTRAVENOUS | Status: DC
  Administered 2015-07-23 – 2015-07-26 (×10): 2 g via INTRAVENOUS
  Filled 2015-07-23 (×12): qty 2

## 2015-07-23 MED ORDER — VANCOMYCIN HCL 10 G IV SOLR
2000.0000 mg | Freq: Once | INTRAVENOUS | Status: AC
  Administered 2015-07-23: 2000 mg via INTRAVENOUS
  Filled 2015-07-23: qty 2000

## 2015-07-23 MED ORDER — METOPROLOL TARTRATE 25 MG/10 ML ORAL SUSPENSION
25.0000 mg | Freq: Two times a day (BID) | ORAL | Status: DC
  Administered 2015-07-23 – 2015-07-28 (×11): 25 mg
  Filled 2015-07-23 (×12): qty 10

## 2015-07-23 MED ORDER — VITAL HIGH PROTEIN PO LIQD
1000.0000 mL | ORAL | Status: DC
  Administered 2015-07-23 – 2015-07-27 (×5): 1000 mL
  Filled 2015-07-23 (×6): qty 1000

## 2015-07-23 MED ORDER — FUROSEMIDE 10 MG/ML IJ SOLN
40.0000 mg | Freq: Once | INTRAMUSCULAR | Status: AC
  Administered 2015-07-23: 40 mg via INTRAVENOUS
  Filled 2015-07-23: qty 4

## 2015-07-23 NOTE — Progress Notes (Signed)
ANTIBIOTIC CONSULT NOTE - INITIAL  Pharmacy Consult for Vancomycin  Indication: rule out pneumonia  No Known Allergies  Patient Measurements: Height: 5' 7"  (170.2 cm) Weight: 275 lb 9.2 oz (125 kg) IBW/kg (Calculated) : 61.6 Adjusted Body Weight: 90 kg  Vital Signs: Temp: 101.2 F (38.4 C) (01/05 2317) Temp Source: Oral (01/05 2317) BP: 139/82 mmHg (01/06 0300) Pulse Rate: 115 (01/06 0300) Intake/Output from previous day: 01/05 0701 - 01/06 0700 In: 4382.6 [I.V.:3677.6; NG/GT:605; IV Piggyback:100] Out: 2000 [Urine:1500; Stool:500] Intake/Output from this shift: Total I/O In: 1548.8 [I.V.:1508.8; NG/GT:40] Out: 700 [Urine:700]  Labs:  Recent Labs  07/21/15 0500  07/22/15 0214 07/22/15 0426 07/22/15 1506 07/23/15 0200 07/23/15 0500  WBC 12.9*  --   --  13.9*  --   --  13.2*  HGB 10.3*  --   --  10.9*  --   --  10.9*  PLT 207  --   --  216  --   --  231  CREATININE  --   < > 0.54  --  0.59 0.51  --   < > = values in this interval not displayed. Estimated Creatinine Clearance: 125.8 mL/min (by C-G formula based on Cr of 0.51). No results for input(s): VANCOTROUGH, VANCOPEAK, VANCORANDOM, GENTTROUGH, GENTPEAK, GENTRANDOM, TOBRATROUGH, TOBRAPEAK, TOBRARND, AMIKACINPEAK, AMIKACINTROU, AMIKACIN in the last 72 hours.   Microbiology: Recent Results (from the past 720 hour(s))  MRSA PCR Screening     Status: None   Collection Time: 07/17/15  1:12 PM  Result Value Ref Range Status   MRSA by PCR NEGATIVE NEGATIVE Final    Comment:        The GeneXpert MRSA Assay (FDA approved for NASAL specimens only), is one component of a comprehensive MRSA colonization surveillance program. It is not intended to diagnose MRSA infection nor to guide or monitor treatment for MRSA infections.   Culture, respiratory (NON-Expectorated)     Status: None   Collection Time: 07/17/15  6:29 PM  Result Value Ref Range Status   Specimen Description ENDOTRACHEAL  Final   Special Requests  NONE  Final   Gram Stain   Final    RARE WBC PRESENT,BOTH PMN AND MONONUCLEAR NO SQUAMOUS EPITHELIAL CELLS SEEN NO ORGANISMS SEEN Performed at [facility]    Culture   Final    NORMAL OROPHARYNGEAL FLORA Performed at [facility]    Report Status 07/20/2015 FINAL  Final    Medical History: Past Medical History  Diagnosis Date  . Hypertension     Medications:  Scheduled:  . antiseptic oral rinse  7 mL Mouth Rinse QID  . cefTRIAXone (ROCEPHIN)  IV  2 g Intravenous Q24H  . chlorhexidine  15 mL Mouth Rinse BID  . feeding supplement (PRO-STAT SUGAR FREE 64)  60 mL Per Tube QID  . feeding supplement (VITAL HIGH PROTEIN)  1,000 mL Per Tube Q24H  . free water  200 mL Per Tube Q6H  . heparin subcutaneous  5,000 Units Subcutaneous 3 times per day  . hydrocortisone sod succinate (SOLU-CORTEF) inj  50 mg Intravenous Q8H   Followed by  . hydrocortisone sod succinate (SOLU-CORTEF) inj  50 mg Intravenous Q12H   Followed by  . [START ON 07/24/2015] hydrocortisone sod succinate (SOLU-CORTEF) inj  50 mg Intravenous Daily  . pantoprazole sodium  40 mg Per Tube Q24H  . potassium chloride  10 mEq Intravenous Q1 Hr x 4   Assessment: 45 y.o. female with SOB/persistent fevers for empiric antibiotics  Goal of Therapy:  Vancomycin trough level 15-20 mcg/ml  Plan:  Vancomycin 2 g IV now, then 1  IV q8h  Caryl Pina 07/23/2015,4:02 AM

## 2015-07-23 NOTE — Progress Notes (Signed)
Pt placed back on full vent support due to increased WOB

## 2015-07-23 NOTE — Progress Notes (Signed)
Nutrition Follow-up  DOCUMENTATION CODES:   Morbid obesity  INTERVENTION:    Change Vital High Protein goal rate to 70 ml/h (1680 ml/day) to provide 1440 kcals, 147 gm protein, 1404 ml free water daily to meet nutrition goal.   NUTRITION DIAGNOSIS:   Inadequate oral intake related to inability to eat as evidenced by NPO status.  Ongoing  GOAL:   Provide needs based on ASPEN/SCCM guidelines  Unmet  MONITOR:   Vent status, Labs, Weight trends, TF tolerance, I & O's  ASSESSMENT:   45 yo WF smoker transferred to [facility] from [facility] ER. Being treated for septic shock, ARDS from pneumococcal lobar pneumonia.  Discussed patient in ICU rounds and with RN today. Cortrak tube was place post-pyloric on 1/2. Propofol is now off. RD to adjust TF to better meet estimated nutrition needs. Patient is currently receiving Vital High Protein at 5 ml/h with Prostat 60 ml QID to provide 920 kcals, 131 gm protein, 100 ml free water daily. Patient required prone positioning on 1/2, currently supine. ARDS protocol in place.  Patient is currently intubated on ventilator support Temp (24hrs), Avg:100.3 F (37.9 C), Min:98.6 F (37 C), Max:101.4 F (38.6 C)  Propofol: off  Diet Order:  Diet NPO time specified  Skin:  Reviewed, no issues  Last BM:  1/6 (flexiseal)  Height:   Ht Readings from Last 1 Encounters:  07/17/15 5' 7"  (1.702 m)    Weight:   Wt Readings from Last 1 Encounters:  07/23/15 274 lb 14.6 oz (124.7 kg)   07/19/15 280 lb 6.8 oz (127.2 kg)        Ideal Body Weight:  61.4 kg  BMI:  Body mass index is 43.05 kg/(m^2).  Estimated Nutritional Needs:   Kcal:  5686-1683  Protein:  123-153 gm  Fluid:  1.8 L  EDUCATION NEEDS:   No education needs identified at this time  Molli Barrows, [place], [place], [place] Pager [phone] After Hours Pager [phone]

## 2015-07-23 NOTE — Progress Notes (Signed)
PULMONARY / CRITICAL CARE MEDICINE   Name: Cynthia Murray MRN: [id number] DOB: 12/26/1970    ADMISSION DATE:  07/17/2015  REFERRING MD:  Lovie Macadamia EDP   CHIEF COMPLAINT:  Short of breath  SUBJECTIVE:  Continued fevers overnight, started on Vanc/Ceftazidime  VITAL SIGNS: BP 134/78 mmHg  Pulse 109  Temp(Src) 99.1 F (37.3 C) (Oral)  Resp 22  Ht 5' 7"  (1.702 m)  Wt 274 lb 14.6 oz (124.7 kg)  BMI 43.05 kg/m2  SpO2 96%  LMP  (LMP Unknown)  HEMODYNAMICS: CVP:  [6 mmHg-9 mmHg] 7 mmHg  VENTILATOR SETTINGS: Vent Mode:  [-] PRVC FiO2 (%):  [40 %-50 %] 40 % Set Rate:  [22 bmp] 22 bmp Vt Set:  [450 mL] 450 mL PEEP:  [8 cmH20] 8 cmH20 Pressure Support:  [10 cmH20] 10 cmH20 Plateau Pressure:  [16 cmH20-22 cmH20] 18 cmH20  INTAKE / OUTPUT: I/O last 3 completed shifts: In: 8275.5 [I.V.:6490.5; NG/GT:885; IV Piggyback:900] Out: 3900 [Urine:3400; Stool:500]  PHYSICAL EXAMINATION: General: Sedated, intubated.  Neuro: RASS -2. Not tracking, no startle reflex. Wiggles left foot to command HEENT: ETT in place, pupils reactive 4 mm, line clean Cardiac: S1/S2 regular, tachycardic Chest: Coarse rhonchi diffusely Abd: Soft, non-tender, non-distended, BS present.  Ext: +1 pitting edema.  Skin: No rashes  LABS:  BMET  Recent Labs Lab 07/22/15 0214 07/22/15 1506 07/23/15 0200  NA 155* 154* 153*  K 3.3* 3.5 3.2*  CL 113* 112* 110  CO2 33* 33* 34*  BUN 32* 27* 24*  CREATININE 0.54 0.59 0.51  GLUCOSE 141* 118* 138*    Electrolytes  Recent Labs Lab 07/22/15 0214 07/22/15 1506 07/23/15 0200  CALCIUM 8.4* 8.4* 8.7*  MG 2.1 2.1 2.2  PHOS 3.2 2.9 2.7    CBC  Recent Labs Lab 07/21/15 0500 07/22/15 0426 07/23/15 0500  WBC 12.9* 13.9* 13.2*  HGB 10.3* 10.9* 10.9*  HCT 32.0* 35.1* 34.9*  PLT 207 216 231    Coag's  Recent Labs Lab 07/19/15 0930  APTT 44*  INR 1.30    Sepsis Markers  Recent Labs Lab 07/17/15 1645 07/17/15 1947 07/18/15 0503  07/19/15 0500 07/19/15 1507  LATICACIDVEN 3.0*  --   --   --  0.97  PROCALCITON  --  9.45 11.47 5.14  --     ABG  Recent Labs Lab 07/21/15 0248 07/21/15 1543 07/22/15 0426  PHART 7.354 7.385 7.340*  PCO2ART 55.7* 58.2* 63.8*  PO2ART 66.0* 65.0* 62.0*    Liver Enzymes  Recent Labs Lab 07/17/15 1947 07/18/15 0503 07/20/15 0300  AST 25 35 38  ALT 15 17 29   ALKPHOS 75 77 159*  BILITOT 0.2* 0.6 0.5  ALBUMIN 2.5* 2.2* 1.9*     Glucose  Recent Labs Lab 07/22/15 0818 07/22/15 1122 07/22/15 1614 07/22/15 1949 07/22/15 2323 07/23/15 0353  GLUCAP 121* 126* 102* 104* 112* 131*    Imaging Dg Chest Port 1 View  07/23/2015  CLINICAL DATA:  Respiratory failure, pneumonia, sepsis, ARDS. EXAM: PORTABLE CHEST 1 VIEW COMPARISON:  Portable chest x-ray of July 22, 2015 FINDINGS: The lungs are slightly better inflated today. There remain coarse interstitial and alveolar opacities but there has been overall improvement. The cardiac silhouette is top-normal in size but the margins are obscured. The pulmonary vascularity is engorged but slightly more distinct. There is no large pleural effusion or nor evidence of a pneumothorax. The endotracheal tube tip lies 4.2 cm above the carina. The feeding tube tip projects below the inferior margin of the  image. The left internal jugular venous catheter tip projects over the proximal SVC. IMPRESSION: Slight interval improvement in pulmonary interstitial and alveolar edema but CHF remains. The support tubes are in reasonable position. Electronically Signed   By: David  Martinique M.D.   On: 07/23/2015 07:46     STUDIES:  12/31 CT chest >> Lobar consolidation LUL with widespread infiltrates 12/31 CT head >> negative 1/4 CXR >> improved  CULTURES: 12/31 Blood ([place]) >> NEG 12/31 Pneumococcal Ag >> POSITIVE 12/31 Legionella Ag >> NEG 12/31 Sputum >> NEG 01/01 HIV >> NEG 01/06 Sputum >>  01/05 Blood >>   ANTIBIOTICS: Vanc 12/31  >>1/2 Vanc 1/6 (fever)>> Zosyn 12/31 >>1/2 Ceftriaxone 1/2 >>> 1/6 Ceftaz 1/6 (fever)>>>  SIGNIFICANT EVENTS: 12/31 Transfer from [place], [place], Sepsis 01/01 ARDS protocol 1/2 Prone required 1/3 Supine  LINES/TUBES: 12/31 ETT >> 12/31 LIJ CVL >> 1/1 R Radial A-line>>> 1/6  DISCUSSION: 45 yo female with septic shock, ARDS from pneumococcal lobar pneumonia.  ASSESSMENT / PLAN:  PULMONARY A: Acute hypoxic respiratory failure 2nd to CAP and ARDS No evidence new infiltrates P:   ARDS protocol Continue at 7 cc/kg, rate 22  Goal to peep 5 today , met Wean cpap 5 ps 5, goal 2 hrs  CARDIOVASCULAR A:  Septic shock off pressors 07/20/15 HTN Tachycardia, asymptomatic Free water deficit P:  May bolus and assess response Reduce steroids to off over 3 days Positive I/O 3.5L over 24 hrs Consider some lasix with free water  RENAL A:  Hypernatremia; improving Retaining bicarb for co2 hypercapnea ARDS hypok P:   Continue free water via NGT 200 mL q6h Continue D5W to reduce 125 BMP q12h k supp Keep even goals, lasix x 1  GASTROINTESTINAL A:   Nutrition, BM noted P:   Tube feeds  Protonix  lft in am   HEMATOLOGIC A:   Leukocytosis Normocytic Anemia Hemoconcentration P:  SQ heparin for DVT prophylaxis   INFECTIOUS A:   Septic shock 2nd to Pneumococcal lobar pneumonia, no risk factor for res step pNA Fever  P:   Restart Vancomycin, change to Ceftaz 1/5, maintain Follow blood cultures D/c A-line, d/c CVL with piv Send sputum Review MAR atx as cause?  ENDOCRINE A:   No issues P:   Monitor CBG while on tube feeds  NEUROLOGIC A:   Acute encephalopathy 2nd to sepsis H/o of headaches, chronic back pain, anxiety, depression P:  Hold home zanaflex/tramadol/mobic/neurontin/prozac  Prop dc trig up fent to RASS goal -1   Natasha Bence, MD PGY-3, Internal Medicine Pager: [phone]    STAFF NOTE: [name], MD FACP have personally reviewed  patient's available data, including medical history, events of note, physical examination and test results as part of my evaluation. I have discussed with resident/NP and other care providers such as pharmacist, RN and RRT. In addition, I personally evaluated patient and elicited key findings of: more lethargic, peep improved today, pcxr unchanged, mild fever noted, wean cpap5 ps 5, goal 2 hr, need improved neuro to extubate, d5w to correct na, some lasix to limit such gross pos balance, keep vanc, ceftaz, dc line neck, ua neg, upright, chair position, rass to -1, dc prop as tg elevated, add prn versed, chem in am, lft in am  The patient is critically ill with multiple organ systems failure and requires high complexity decision making for assessment and support, frequent evaluation and titration of therapies, application of advanced monitoring technologies and extensive interpretation of multiple databases.   Critical  Care Time devoted to patient care services described in this note is30 Minutes. This time reflects time of care of this signee: Merrie Roof, MD FACP. This critical care time does not reflect procedure time, or teaching time or supervisory time of PA/NP/Med student/Med Resident etc but could involve care discussion time. Rest per NP/medical resident whose note is outlined above and that I agree with   Lavon Paganini. Titus Mould, MD, [place] Pgr: [facility] 07/23/2015 8:43 AM

## 2015-07-24 DIAGNOSIS — T801XXA Vascular complications following infusion, transfusion and therapeutic injection, initial encounter: Secondary | ICD-10-CM

## 2015-07-24 LAB — PHOSPHORUS: Phosphorus: 2.8 mg/dL (ref 2.5–4.6)

## 2015-07-24 LAB — CBC
HEMATOCRIT: 36.5 % (ref 36.0–46.0)
Hemoglobin: 11.2 g/dL — ABNORMAL LOW (ref 12.0–15.0)
MCH: 28.9 pg (ref 26.0–34.0)
MCHC: 30.7 g/dL (ref 30.0–36.0)
MCV: 94.3 fL (ref 78.0–100.0)
Platelets: 249 10*3/uL (ref 150–400)
RBC: 3.87 MIL/uL (ref 3.87–5.11)
RDW: 14 % (ref 11.5–15.5)
WBC: 13.4 10*3/uL — ABNORMAL HIGH (ref 4.0–10.5)

## 2015-07-24 LAB — GLUCOSE, CAPILLARY
GLUCOSE-CAPILLARY: 116 mg/dL — AB (ref 65–99)
GLUCOSE-CAPILLARY: 117 mg/dL — AB (ref 65–99)
GLUCOSE-CAPILLARY: 121 mg/dL — AB (ref 65–99)
GLUCOSE-CAPILLARY: 124 mg/dL — AB (ref 65–99)
GLUCOSE-CAPILLARY: 140 mg/dL — AB (ref 65–99)
Glucose-Capillary: 133 mg/dL — ABNORMAL HIGH (ref 65–99)

## 2015-07-24 LAB — COMPREHENSIVE METABOLIC PANEL
ALBUMIN: 2.3 g/dL — AB (ref 3.5–5.0)
ALK PHOS: 109 U/L (ref 38–126)
ALT: 26 U/L (ref 14–54)
AST: 24 U/L (ref 15–41)
Anion gap: 11 (ref 5–15)
BILIRUBIN TOTAL: 0.5 mg/dL (ref 0.3–1.2)
BUN: 26 mg/dL — AB (ref 6–20)
CO2: 33 mmol/L — ABNORMAL HIGH (ref 22–32)
Calcium: 8.9 mg/dL (ref 8.9–10.3)
Chloride: 103 mmol/L (ref 101–111)
Creatinine, Ser: 0.42 mg/dL — ABNORMAL LOW (ref 0.44–1.00)
GFR calc Af Amer: >60 mL/min (ref >60)
GFR calc non Af Amer: >60 mL/min (ref >60)
GLUCOSE: 115 mg/dL — AB (ref 65–99)
POTASSIUM: 3.1 mmol/L — AB (ref 3.5–5.1)
Sodium: 147 mmol/L — ABNORMAL HIGH (ref 135–145)
TOTAL PROTEIN: 5.7 g/dL — AB (ref 6.5–8.1)

## 2015-07-24 LAB — MAGNESIUM
MAGNESIUM: 1.8 mg/dL (ref 1.7–2.4)
Magnesium: 1.9 mg/dL (ref 1.7–2.4)

## 2015-07-24 MED ORDER — POTASSIUM CHLORIDE 10 MEQ/100ML IV SOLN
10.0000 meq | INTRAVENOUS | Status: AC
  Administered 2015-07-24 (×3): 10 meq via INTRAVENOUS
  Filled 2015-07-24 (×3): qty 100

## 2015-07-24 MED ORDER — POTASSIUM CHLORIDE 10 MEQ/50ML IV SOLN: 10.0000 meq | INTRAVENOUS | Status: DC

## 2015-07-24 NOTE — Progress Notes (Signed)
Pt placed back on full vent support due to increased RR

## 2015-07-24 NOTE — Progress Notes (Signed)
PULMONARY / CRITICAL CARE MEDICINE   Name: Cynthia Murray MRN: [id number] DOB: 02/02/1971    ADMISSION DATE:  07/17/2015  REFERRING MD:  Lovie Macadamia EDP   CHIEF COMPLAINT:  Short of breath  SUBJECTIVE:  Continued fevers overnight, started on Vanc/Ceftazidime  VITAL SIGNS: BP 133/86 mmHg  Pulse 100  Temp(Src) 98.7 F (37.1 C) (Oral)  Resp 20  Ht 5' 7"  (1.702 m)  Wt 277 lb 12.5 oz (126 kg)  BMI 43.50 kg/m2  SpO2 97%  LMP  (LMP Unknown)  HEMODYNAMICS:    VENTILATOR SETTINGS: Vent Mode:  [-] PRVC FiO2 (%):  [40 %] 40 % Set Rate:  [18 bmp] 18 bmp Vt Set:  [450 mL] 450 mL PEEP:  [5 cmH20] 5 cmH20 Pressure Support:  [10 cmH20] 10 cmH20 Plateau Pressure:  [19 cmH20] 19 cmH20  INTAKE / OUTPUT: I/O last 3 completed shifts: In: 8590.7 [I.V.:5566.3; NG/GT:1424.3; IV Piggyback:1600] Out: 4500 [Urine:3900; Stool:600]  PHYSICAL EXAMINATION: General: Sedated, intubated. Obese and appears to be developing CIP Neuro: RASS -2. Not responsive currently HEENT: ETT in place, pupils reactive 4 mm, line clean Cardiac: S1/S2 regular, tachycardic Chest: Coarse rhonchi diffusely Abd: Soft, non-tender, non-distended, BS present.  Ext: +1 pitting edema. Left arm with blebs, inflammation from IV infiltration. Good pulses and cap fill Skin: No rashes  LABS:  BMET  Recent Labs Lab 07/23/15 0200 07/23/15 2114 07/24/15 0238  NA 153* 147* 147*  K 3.2* 3.4* 3.1*  CL 110 102 103  CO2 34* 32 33*  BUN 24* 27* 26*  CREATININE 0.51 0.53 0.42*  GLUCOSE 138* 141* 115*    Electrolytes  Recent Labs Lab 07/22/15 1506 07/23/15 0200 07/23/15 2114 07/24/15 0238  CALCIUM 8.4* 8.7* 9.2 8.9  MG 2.1 2.2  --  1.9  PHOS 2.9 2.7  --  2.8    CBC  Recent Labs Lab 07/22/15 0426 07/23/15 0500 07/24/15 0238  WBC 13.9* 13.2* 13.4*  HGB 10.9* 10.9* 11.2*  HCT 35.1* 34.9* 36.5  PLT 216 231 249    Coag's  Recent Labs Lab 07/19/15 0930  APTT 44*  INR 1.30    Sepsis  Markers  Recent Labs Lab 07/17/15 1645 07/17/15 1947 07/18/15 0503 07/19/15 0500 07/19/15 1507  LATICACIDVEN 3.0*  --   --   --  0.97  PROCALCITON  --  9.45 11.47 5.14  --     ABG  Recent Labs Lab 07/21/15 0248 07/21/15 1543 07/22/15 0426  PHART 7.354 7.385 7.340*  PCO2ART 55.7* 58.2* 63.8*  PO2ART 66.0* 65.0* 62.0*    Liver Enzymes  Recent Labs Lab 07/18/15 0503 07/20/15 0300 07/24/15 0238  AST 35 38 24  ALT 17 29 26   ALKPHOS 77 159* 109  BILITOT 0.6 0.5 0.5  ALBUMIN 2.2* 1.9* 2.3*     Glucose  Recent Labs Lab 07/23/15 1136 07/23/15 1608 07/23/15 2029 07/23/15 2332 07/24/15 0357 07/24/15 0845  GLUCAP 146* 124* 146* 124* 121* 140*    Imaging No results found.   STUDIES:  12/31 CT chest >> Lobar consolidation LUL with widespread infiltrates 12/31 CT head >> negative 1/4 CXR >> improved  CULTURES: 12/31 Blood ([place]) >> NEG 12/31 Pneumococcal Ag >> POSITIVE 12/31 Legionella Ag >> NEG 12/31 Sputum >> NEG 01/01 HIV >> NEG 01/06 Sputum >>  01/05 Blood >>   ANTIBIOTICS: Vanc 12/31 >>1/2 Vanc 1/6 (fever)>> Zosyn 12/31 >>1/2 Ceftriaxone 1/2 >>> 1/6 Ceftaz 1/6 (fever)>>>  SIGNIFICANT EVENTS: 12/31 Transfer from [place], [place], Sepsis 01/01 ARDS protocol 1/2 Prone required  1/3 Supine  LINES/TUBES: 12/31 ETT >> 12/31 LIJ CVL >> 1/1 R Radial A-line>>> 1/6  DISCUSSION: 45 yo female with septic shock, ARDS from pneumococcal lobar pneumonia. Weak and weaning is slow.  ASSESSMENT / PLAN:  PULMONARY A: Acute hypoxic respiratory failure 2nd to CAP and ARDS No evidence new infiltrates P:   ARDS protocol Continue at 7 cc/kg, rate 22  Goal to peep 5 today , met Wean cpap 5 ps 5, goal 2 hrs, 1/7 increased wob after 30 min back on full support. May need trach for wean  CARDIOVASCULAR A:  Septic shock off pressors 07/20/15 HTN Tachycardia, asymptomatic Free water deficit  Intake/Output Summary (Last 24 hours) at 07/24/15  1059 Last data filed at 07/24/15 1000  Gross per 24 hour  Intake 4826.84 ml  Output   3650 ml  Net 1176.84 ml   P:  Reduce steroids to off over 3 days, wean order written Positive I/O 1.1 over 24 hrs  free water  RENAL  Recent Labs Lab 07/23/15 0200 07/23/15 2114 07/24/15 0238  NA 153* 147* 147*    Recent Labs Lab 07/23/15 0200 07/23/15 2114 07/24/15 0238  K 3.2* 3.4* 3.1*      A:  Hypernatremia; improving Retaining bicarb for co2 hypercapnea ARDS hypok P:   Continue free water via NGT 200 mL q6h Continue D5W  125 BMP q12h k supp Keep even goals, lasix x 1 on 1/6  GASTROINTESTINAL A:   Nutrition, BM noted P:   Tube feeds  Protonix    HEMATOLOGIC A:   Leukocytosis Normocytic Anemia Hemoconcentration P:  SQ heparin for DVT prophylaxis   INFECTIOUS A:   Septic shock 2nd to Pneumococcal lobar pneumonia, no risk factor for res step pNA Fever  P:   Restart Vancomycin, change to Ceftaz 1/5, maintain Follow blood cultures D/c A-line, d/c CVL with piv Send sputum 1/7 note inflammation , blebs left arm form IV infiltration. If new IV needed consider PICC line.  ENDOCRINE A:   No issues P:   Monitor CBG while on tube feeds  NEUROLOGIC A:   Acute encephalopathy 2nd to sepsis H/o of headaches, chronic back pain, anxiety, depression P:  Hold home zanaflex/tramadol/mobic/neurontin/prozac  Prop dc'd 1/6 for elevated triglycerides   fent to RASS goal -1   Richardson Landry Crickett Abbett ACNP [facility] PCC[facility] Pager [phone] till 3 pm If no answer page [phone] 07/24/2015, 10:54 AM

## 2015-07-25 ENCOUNTER — Inpatient Hospital Stay (HOSPITAL_COMMUNITY): Payer: Self-pay

## 2015-07-25 DIAGNOSIS — G7281 Critical illness myopathy: Secondary | ICD-10-CM

## 2015-07-25 LAB — GLUCOSE, CAPILLARY
GLUCOSE-CAPILLARY: 99 mg/dL (ref 65–99)
Glucose-Capillary: 109 mg/dL — ABNORMAL HIGH (ref 65–99)
Glucose-Capillary: 112 mg/dL — ABNORMAL HIGH (ref 65–99)
Glucose-Capillary: 122 mg/dL — ABNORMAL HIGH (ref 65–99)
Glucose-Capillary: 95 mg/dL (ref 65–99)

## 2015-07-25 LAB — BASIC METABOLIC PANEL
Anion gap: 10 (ref 5–15)
BUN: 17 mg/dL (ref 6–20)
CALCIUM: 8.7 mg/dL — AB (ref 8.9–10.3)
CHLORIDE: 99 mmol/L — AB (ref 101–111)
CO2: 32 mmol/L (ref 22–32)
CREATININE: 0.36 mg/dL — AB (ref 0.44–1.00)
Glucose, Bld: 105 mg/dL — ABNORMAL HIGH (ref 65–99)
Potassium: 2.8 mmol/L — ABNORMAL LOW (ref 3.5–5.1)
SODIUM: 141 mmol/L (ref 135–145)

## 2015-07-25 LAB — CBC
HCT: 35 % — ABNORMAL LOW (ref 36.0–46.0)
HEMOGLOBIN: 11.4 g/dL — AB (ref 12.0–15.0)
MCH: 29.9 pg (ref 26.0–34.0)
MCHC: 32.6 g/dL (ref 30.0–36.0)
MCV: 91.9 fL (ref 78.0–100.0)
PLATELETS: 234 10*3/uL (ref 150–400)
RBC: 3.81 MIL/uL — ABNORMAL LOW (ref 3.87–5.11)
RDW: 13.7 % (ref 11.5–15.5)
WBC: 13.8 10*3/uL — ABNORMAL HIGH (ref 4.0–10.5)

## 2015-07-25 LAB — CULTURE, RESPIRATORY W GRAM STAIN

## 2015-07-25 LAB — VANCOMYCIN, TROUGH: VANCOMYCIN TR: 23 ug/mL — AB (ref 10.0–20.0)

## 2015-07-25 LAB — MAGNESIUM
MAGNESIUM: 1.8 mg/dL (ref 1.7–2.4)
MAGNESIUM: 1.7 mg/dL (ref 1.7–2.4)
Magnesium: 2.3 mg/dL (ref 1.7–2.4)

## 2015-07-25 LAB — CK: CK TOTAL: 24 U/L — AB (ref 38–234)

## 2015-07-25 LAB — CULTURE, RESPIRATORY

## 2015-07-25 LAB — PHOSPHORUS: PHOSPHORUS: 3.4 mg/dL (ref 2.5–4.6)

## 2015-07-25 MED ORDER — POTASSIUM CHLORIDE 20 MEQ/15ML (10%) PO SOLN
40.0000 meq | Freq: Two times a day (BID) | ORAL | Status: DC
  Administered 2015-07-25: 40 meq
  Filled 2015-07-25 (×3): qty 30

## 2015-07-25 MED ORDER — MAGNESIUM SULFATE 2 GM/50ML IV SOLN
2.0000 g | Freq: Once | INTRAVENOUS | Status: AC
  Administered 2015-07-25: 2 g via INTRAVENOUS
  Filled 2015-07-25: qty 50

## 2015-07-25 MED ORDER — POTASSIUM CHLORIDE 20 MEQ/15ML (10%) PO SOLN
40.0000 meq | ORAL | Status: AC
  Administered 2015-07-25 (×3): 40 meq
  Filled 2015-07-25 (×3): qty 30

## 2015-07-25 MED ORDER — FUROSEMIDE 8 MG/ML PO SOLN
40.0000 mg | Freq: Two times a day (BID) | ORAL | Status: DC
  Filled 2015-07-25 (×2): qty 5

## 2015-07-25 MED ORDER — FUROSEMIDE 10 MG/ML PO SOLN
40.0000 mg | Freq: Two times a day (BID) | ORAL | Status: AC
  Administered 2015-07-25 – 2015-07-26 (×2): 40 mg via ORAL
  Filled 2015-07-25 (×2): qty 4

## 2015-07-25 NOTE — Progress Notes (Signed)
PULMONARY / CRITICAL CARE MEDICINE   Name: Cynthia Murray MRN: [id number] DOB: 10-Mar-1971    ADMISSION DATE:  07/17/2015  REFERRING MD:  Lovie Macadamia EDP   CHIEF COMPLAINT:  Short of breath  SUBJECTIVE:  Mental status significantly improved, follows commands, tracks. No fevers overnight. Several fluid filled bullous lesions on left forearm.  VITAL SIGNS: BP 130/80 mmHg  Pulse 102  Temp(Src) 99 F (37.2 C) (Oral)  Resp 22  Ht 5' 7"  (1.702 m)  Wt 284 lb 2.8 oz (128.9 kg)  BMI 44.50 kg/m2  SpO2 96%  LMP  (LMP Unknown)  HEMODYNAMICS:    VENTILATOR SETTINGS: Vent Mode:  [-] PSV;CPAP FiO2 (%):  [40 %] 40 % Set Rate:  [16 bmp-18 bmp] 18 bmp Vt Set:  [450 mL] 450 mL PEEP:  [5 cmH20] 5 cmH20 Pressure Support:  [5 cmH20-14 cmH20] 5 cmH20 Plateau Pressure:  [17 cmH20-18 cmH20] 18 cmH20  INTAKE / OUTPUT: I/O last 3 completed shifts: In: 6396.5 [I.V.:2367.5; NG/GT:2879; IV [id number]:2355] Out: 7322 [Urine:3250; Stool:300]  PHYSICAL EXAMINATION: General: Sedated, intubated.  Neuro: RASS +1, follows commands, tracks with eyes.  HEENT: ETT in place, pupils reactive 4 mm Cardiac: S1/S2 regular, mildly tachycardic Chest: Air entry equal bilaterally, scattered rhonchi. Improved. No wheezes.  Abd: Soft, non-tender, non-distended, BS present.  Ext: +1 pitting edema. Left arm with fluid filled bulla. Right forearm with erythema. Good pulses. Skin: Right arm erythema, left arm bulla.   LABS:  BMET  Recent Labs Lab 07/23/15 2114 07/24/15 0238 07/25/15 0615  NA 147* 147* 141  K 3.4* 3.1* 2.8*  CL 102 103 99*  CO2 32 33* 32  BUN 27* 26* 17  CREATININE 0.53 0.42* 0.36*  GLUCOSE 141* 115* 105*    Electrolytes  Recent Labs Lab 07/23/15 0200 07/23/15 2114 07/24/15 0238 07/24/15 1413 07/25/15 0126 07/25/15 0615  CALCIUM 8.7* 9.2 8.9  --   --  8.7*  MG 2.2  --  1.9 1.8 1.8 1.7  PHOS 2.7  --  2.8  --   --  3.4    CBC  Recent Labs Lab 07/23/15 0500 07/24/15 0238  07/25/15 0615  WBC 13.2* 13.4* 13.8*  HGB 10.9* 11.2* 11.4*  HCT 34.9* 36.5 35.0*  PLT 231 249 234    Coag's  Recent Labs Lab 07/19/15 0930  APTT 44*  INR 1.30    Sepsis Markers  Recent Labs Lab 07/19/15 0500 07/19/15 1507  LATICACIDVEN  --  0.97  PROCALCITON 5.14  --     ABG  Recent Labs Lab 07/21/15 0248 07/21/15 1543 07/22/15 0426  PHART 7.354 7.385 7.340*  PCO2ART 55.7* 58.2* 63.8*  PO2ART 66.0* 65.0* 62.0*    Liver Enzymes  Recent Labs Lab 07/20/15 0300 07/24/15 0238  AST 38 24  ALT 29 26  ALKPHOS 159* 109  BILITOT 0.5 0.5  ALBUMIN 1.9* 2.3*     Glucose  Recent Labs Lab 07/24/15 0845 07/24/15 1212 07/24/15 1624 07/24/15 2218 07/24/15 2348 07/25/15 0401  GLUCAP 140* 116* 117* 133* 122* 95    Imaging Dg Chest Port 1 View  07/25/2015  CLINICAL DATA:  Respiratory failure, pneumonia, sepsis, ARDS EXAM: PORTABLE CHEST 1 VIEW COMPARISON:  Chest x-rays dated 07/23/2015 and 07/22/2015. FINDINGS: Endotracheal tube remains well positioned with tip approximately 3 cm above the level of the carina. Enteric tube passes below the diaphragm. Cardiomegaly is stable. There is persistent central pulmonary vascular congestion and mild bilateral interstitial edema, not significantly changed. Suspect additional atelectasis at each lung base,  possible small left pleural effusion. No pneumothorax IMPRESSION: No significant interval change. Persistent central pulmonary vascular congestion and mild bilateral interstitial edema, suggesting mild volume overload/CHF. Suspect additional atelectasis at each lung base and small left pleural effusion. Electronically Signed   By: Franki Cabot M.D.   On: 07/25/2015 08:26     STUDIES:  12/31 CT chest >> Lobar consolidation LUL with widespread infiltrates 12/31 CT head >> negative 1/4 CXR >> improved   CULTURES: 12/31 Blood ([place]) >> NEG 12/31 Pneumococcal Ag >> POSITIVE 12/31 Legionella Ag >> NEG 12/31 Sputum >>  NEG 01/01 HIV >> NEG 01/06 Sputum >>  01/05 Blood >>   ANTIBIOTICS: Vanc 12/31 >>1/2  Vanc 1/6 (fever) >> 1/8 Zosyn 12/31 >>1/2 Ceftriaxone 1/2 >>> 1/6 Ceftaz 1/6 (fever) >>>  SIGNIFICANT EVENTS: 12/31 Transfer from [place], [place], Sepsis 01/01 ARDS protocol 1/2 Prone required 1/3 Supine  LINES/TUBES: 12/31 ETT >> 12/31 LIJ CVL >> 1/6 1/1 R Radial A-line>>> 1/6  DISCUSSION: 45 yo female with septic shock, ARDS from pneumococcal lobar pneumonia. Weak and weaning is slow.  ASSESSMENT / PLAN:  PULMONARY A: Acute hypoxic respiratory failure 2nd to CAP and ARDS No evidence new infiltrates P:   ARDS protocol Wean cpap 5 ps 5, goal 2 hrs, 1/7 increased wob after 30 min back on full support May need trach for wean  CARDIOVASCULAR A:  Septic shock off pressors 07/20/15 HTN Tachycardia, asymptomatic, improving P:  Reduce steroids to off over 3 days, wean order written Positive I/O 1.1 over 24 hrs Give Lasix 40 IV once today  RENAL  Recent Labs Lab 07/23/15 2114 07/24/15 0238 07/25/15 0615  NA 147* 147* 141    Recent Labs Lab 07/23/15 2114 07/24/15 0238 07/25/15 0615  K 3.4* 3.1* 2.8*  A:  Hypernatremia; resolved Hypokalemia P:   Discontinue free water Discontinue D5W BMP q12h Supp K, mag Keep even goals Lasix 40 mg IV once  GASTROINTESTINAL A:   Nutrition P:   Tube feeds  Protonix   HEMATOLOGIC A:   Leukocytosis; 2/2 steroids Normocytic Anemia DVT PPx P:  SQ heparin  INFECTIOUS A:   Septic shock 2nd to Pneumococcal lobar pneumonia, no risk factor for res step pNA Fever  Right arm erythema, left arm bullae P:   Discontinue Vanc, continue Ceftaz Follow blood cultures 1/7 note inflammation, blebs left arm from IV infiltration. If new IV needed consider PICC line.  ENDOCRINE A:   No issues P:   Monitor CBG while on tube feeds  NEUROLOGIC A:   Acute encephalopathy 2nd to sepsis; significantly improved.  H/o of headaches,  chronic back pain, anxiety, depression P:  Hold home zanaflex/tramadol/mobic/neurontin/prozac  Fentanyl to RASS goal 0   Natasha Bence, MD PGY-3, Internal Medicine Pager: [phone]  07/25/2015, 8:29 AM

## 2015-07-25 NOTE — Progress Notes (Signed)
Vancomycin infusion paused at this time due to IV infiltration of right AC PIV, blistering noted at IV site similar to blistering over lt arm, will continue to monitor.

## 2015-07-25 NOTE — Progress Notes (Signed)
Pt placed back on full support due to increased RR

## 2015-07-25 NOTE — Progress Notes (Signed)
Bedside report to Ethelene Browns, RN, to include Vancomycin on hold, to resume only with MD order.

## 2015-07-25 NOTE — Progress Notes (Signed)
Linton Rump , RN in [place], due to IV team, Engineer, manufacturing systems, RN preferring not have only PIV at this time due to significant blistering over left arm, new blister at right AC, small infiltration, and IV team suggests need for CVL placement.  Vancomycin is currently on hold per order.  Will continue to monitor.

## 2015-07-26 ENCOUNTER — Inpatient Hospital Stay (HOSPITAL_COMMUNITY): Payer: Self-pay

## 2015-07-26 LAB — C DIFFICILE QUICK SCREEN W PCR REFLEX
C DIFFICILE (CDIFF) INTERP: NEGATIVE
C DIFFICILE (CDIFF) TOXIN: NEGATIVE
C Diff antigen: NEGATIVE

## 2015-07-26 LAB — BASIC METABOLIC PANEL
Anion gap: 10 (ref 5–15)
BUN: 16 mg/dL (ref 6–20)
CHLORIDE: 100 mmol/L — AB (ref 101–111)
CO2: 31 mmol/L (ref 22–32)
Calcium: 8.6 mg/dL — ABNORMAL LOW (ref 8.9–10.3)
Creatinine, Ser: 0.39 mg/dL — ABNORMAL LOW (ref 0.44–1.00)
Glucose, Bld: 118 mg/dL — ABNORMAL HIGH (ref 65–99)
POTASSIUM: 4.3 mmol/L (ref 3.5–5.1)
SODIUM: 141 mmol/L (ref 135–145)

## 2015-07-26 LAB — GLUCOSE, CAPILLARY
GLUCOSE-CAPILLARY: 95 mg/dL (ref 65–99)
GLUCOSE-CAPILLARY: 117 mg/dL — AB (ref 65–99)
GLUCOSE-CAPILLARY: 133 mg/dL — AB (ref 65–99)
Glucose-Capillary: 118 mg/dL — ABNORMAL HIGH (ref 65–99)
Glucose-Capillary: 113 mg/dL — ABNORMAL HIGH (ref 65–99)
Glucose-Capillary: 121 mg/dL — ABNORMAL HIGH (ref 65–99)

## 2015-07-26 LAB — PHOSPHORUS: PHOSPHORUS: 3 mg/dL (ref 2.5–4.6)

## 2015-07-26 LAB — MAGNESIUM: MAGNESIUM: 1.9 mg/dL (ref 1.7–2.4)

## 2015-07-26 MED ORDER — FUROSEMIDE 10 MG/ML PO SOLN
40.0000 mg | Freq: Two times a day (BID) | ORAL | Status: AC
  Administered 2015-07-26 – 2015-07-27 (×2): 40 mg via ORAL
  Filled 2015-07-26 (×2): qty 4

## 2015-07-26 MED ORDER — BUSPIRONE HCL 15 MG PO TABS
7.5000 mg | ORAL_TABLET | Freq: Two times a day (BID) | ORAL | Status: DC
  Administered 2015-07-26 – 2015-08-03 (×14): 7.5 mg via ORAL
  Filled 2015-07-26 (×18): qty 1

## 2015-07-26 MED ORDER — POTASSIUM CHLORIDE 20 MEQ/15ML (10%) PO SOLN
40.0000 meq | Freq: Two times a day (BID) | ORAL | Status: AC
  Administered 2015-07-26 (×2): 40 meq
  Filled 2015-07-26 (×2): qty 30

## 2015-07-26 NOTE — Evaluation (Signed)
Physical Therapy Evaluation Patient Details Name: Cynthia Murray MRN: [id number] DOB: 03-29-1971 Today's Date: 07/26/2015   History of Present Illness  pt is a 45 y/o female with h/o HTN admitted from [facility] with cough fever, chills, SOB, pleuritic pain, HA.  CXR showed LLL PNA, ETT placed on arrival, 1/1   ARDS protocol initiated.  Clinical Impression  Pt admitted with/for PNA and ARDS.  Pt currently limited functionally due to the problems listed. ( See problems list.)   Pt will benefit from PT to maximize function and safety in order to get ready for next venue listed below.     Follow Up Recommendations CIR    Equipment Recommendations  Other (comment) (TBA)    Recommendations for Other Services Rehab consult;OT consult     Precautions / Restrictions Precautions Precautions: Fall      Mobility  Bed Mobility Overal bed mobility: Needs Assistance Bed Mobility: Supine to Sit;Sit to Sidelying     Supine to sit: +2 for physical assistance;Total assist   Sit to sidelying: Max assist;+2 for physical assistance General bed mobility comments: Initially, pt needed full assist, but after EOB activity to "wake up " trunk, pt assisted minimally to get back to sidelying.  Transfers                 General transfer comment: stand not attempted  Ambulation/Gait                Stairs            Wheelchair Mobility    Modified Rankin (Stroke Patients Only)       Balance Overall balance assessment: Needs assistance Sitting-balance support: Feet supported;Single extremity supported;Bilateral upper extremity supported Sitting balance-Leahy Scale: Poor Sitting balance - Comments: sat EOB >=12 min, working on sitting toleranc, sitting balance (with mod assist, balance while challenging with LAQ's Postural control: Right lateral lean;Posterior lean                                   Pertinent Vitals/Pain      Home Living Family/patient  expects to be discharged to:: Private residence   Available Help at Discharge:  (no family present to confirm)                  Prior Function Level of Independence: Independent               Hand Dominance        Extremity/Trunk Assessment   Upper Extremity Assessment: Generalized weakness           Lower Extremity Assessment: Generalized weakness         Communication   Communication: Other (comment) (ETT in place)  Cognition Arousal/Alertness: Awake/alert Behavior During Therapy: Anxious Overall Cognitive Status: Difficult to assess                      General Comments General comments (skin integrity, edema, etc.): VSS, HR in the 120's    Exercises General Exercises - Lower Extremity Ankle Circles/Pumps: AAROM;10 reps;Both;Supine Heel Slides: AROM;AAROM;Both;10 reps;Supine (graded resistance)      Assessment/Plan    PT Assessment Patient needs continued PT services  PT Diagnosis Generalized weakness   PT Problem List Decreased strength;Decreased activity tolerance;Decreased balance;Decreased mobility;Cardiopulmonary status limiting activity;Obesity  PT Treatment Interventions Gait training;DME instruction;Functional mobility training;Therapeutic activities;Therapeutic exercise;Balance training;Patient/family education   PT Goals (Current goals can be found  in the Care Plan section) Acute Rehab PT Goals Patient Stated Goal: pt unable to participate due to ETT PT Goal Formulation: With patient Time For Goal Achievement: 08/09/15 Potential to Achieve Goals: Good    Frequency Min 3X/week   Barriers to discharge        Co-evaluation               End of Session   Activity Tolerance: Patient tolerated treatment well Patient left: in bed;with call bell/phone within reach Nurse Communication: Mobility status         Time: 1450-1513 PT Time Calculation (min) (ACUTE ONLY): 23 min   Charges:   PT Evaluation $PT Eval  Moderate Complexity: 1 Procedure PT Treatments $Therapeutic Activity: 8-22 mins   PT G Codes:        Cynthia Murray, Tessie Fass 07/26/2015, 4:53 PM  07/26/2015  Donnella Sham, PT [phone]  (pager)

## 2015-07-26 NOTE — Progress Notes (Signed)
PULMONARY / CRITICAL CARE MEDICINE   Name: Cynthia Murray MRN: [id number] DOB: [birth year]    ADMISSION DATE:  07/17/2015  REFERRING MD:  Lovie Macadamia EDP   CHIEF COMPLAINT:  Short of breath  SUBJECTIVE:  Edematous, continuous improvement in neuro exam. Significant generalized weakness. Says she has abdominal pain today.   VITAL SIGNS: BP 124/74 mmHg  Pulse 112  Temp(Src) 98.9 F (37.2 C) (Oral)  Resp 23  Ht 5' 7"  (1.702 m)  Wt 281 lb 1.4 oz (127.5 kg)  BMI 44.01 kg/m2  SpO2 97%  LMP  (LMP Unknown)  HEMODYNAMICS:    VENTILATOR SETTINGS: Vent Mode:  [-] PRVC FiO2 (%):  [40 %] 40 % Set Rate:  [18 bmp] 18 bmp Vt Set:  [450 mL] 450 mL PEEP:  [5 cmH20] 5 cmH20 Plateau Pressure:  [15 cmH20-19 cmH20] 15 cmH20  INTAKE / OUTPUT: I/O last 3 completed shifts: In: 3682.5 [I.V.:502.5; NG/GT:2680; IV [id number]:237] Out: 6283 [Urine:3850]  PHYSICAL EXAMINATION: General: Sedated, intubated.  Neuro: RASS +1, follows commands, tracks with eyes.  HEENT: ETT in place, PERRL, EOMI.  Cardiac: S1/S2 regular, mildly tachycardic Chest: Air entry equal bilaterally, scattered rhonchi and rales. No wheezes.  Abd: Soft, non-tender, non-distended, BS present.  Ext: +2 pitting edema. Left arm with fluid filled bulla. Right forearm with erythema. Good pulses. Skin: Right arm erythema, left arm bulla.   LABS:  BMET  Recent Labs Lab 07/24/15 0238 07/25/15 0615 07/26/15 0202  NA 147* 141 141  K 3.1* 2.8* 4.3  CL 103 99* 100*  CO2 33* 32 31  BUN 26* 17 16  CREATININE 0.42* 0.36* 0.39*  GLUCOSE 115* 105* 118*    Electrolytes  Recent Labs Lab 07/24/15 0238  07/25/15 0615 07/25/15 1510 07/26/15 0202  CALCIUM 8.9  --  8.7*  --  8.6*  MG 1.9  < > 1.7 2.3 1.9  PHOS 2.8  --  3.4  --  3.0  < > = values in this interval not displayed.  CBC  Recent Labs Lab 07/23/15 0500 07/24/15 0238 07/25/15 0615  WBC 13.2* 13.4* 13.8*  HGB 10.9* 11.2* 11.4*  HCT 34.9* 36.5 35.0*  PLT  231 249 234    Sepsis Markers  Recent Labs Lab 07/19/15 1507  LATICACIDVEN 0.97    ABG  Recent Labs Lab 07/21/15 0248 07/21/15 1543 07/22/15 0426  PHART 7.354 7.385 7.340*  PCO2ART 55.7* 58.2* 63.8*  PO2ART 66.0* 65.0* 62.0*    Liver Enzymes  Recent Labs Lab 07/20/15 0300 07/24/15 0238  AST 38 24  ALT 29 26  ALKPHOS 159* 109  BILITOT 0.5 0.5  ALBUMIN 1.9* 2.3*     Glucose  Recent Labs Lab 07/25/15 0927 07/25/15 1245 07/25/15 2127 07/26/15 0055 07/26/15 0534 07/26/15 0726  GLUCAP 112* 99 109* 118* 95 113*    Imaging Dg Chest Port 1 View  07/26/2015  CLINICAL DATA:  Respiratory failure. EXAM: PORTABLE CHEST 1 VIEW COMPARISON:  07/25/2015. FINDINGS: Endotracheal tube and feeding tube in stable position. Mediastinum and hilar structures are stable . Cardiomegaly with pulmonary venous congestion and bilateral pulmonary infiltrates again noted suggesting congestive heart failure. Bilateral pneumonia cannot be excluded. Low lung volumes with basilar atelectasis. Small bilateral pleural effusions cannot be excluded. No pneumothorax. IMPRESSION: 1. Lines and tubes stable position. 2. Cardiomegaly with pulmonary vascular prominence and bilateral pulmonary infiltrates. Small bilateral effusions. Findings consistent with congestive heart failure. No interim change. 3. Low lung volumes with basilar atelectasis. Electronically Signed   By: Marcello Moores  Register  On: 07/26/2015 07:34     STUDIES:  12/31 CT chest >> Lobar consolidation LUL with widespread infiltrates 12/31 CT head >> negative 1/4 CXR >> improved   CULTURES: 12/31 Blood ([place]) >> NEG 12/31 Pneumococcal Ag >> POSITIVE 12/31 Legionella Ag >> NEG 12/31 Sputum >> NEG 01/01 HIV >> NEG 01/06 Sputum >>  01/05 Blood >>   ANTIBIOTICS: Vanc 12/31 >>1/2  Vanc 1/6 (fever) >> 1/8 Zosyn 12/31 >>1/2 Ceftriaxone 1/2 >>> 1/6 Ceftaz 1/6 (fever) >>>  SIGNIFICANT EVENTS: 12/31 Transfer from [place], [place],  Sepsis 01/01 ARDS protocol 1/2 Prone required 1/3 Supine  LINES/TUBES: 12/31 ETT >> 12/31 LIJ CVL >> 1/6 1/1 R Radial A-line>>> 1/6  DISCUSSION: 45 yo female with septic shock, ARDS from pneumococcal lobar pneumonia. Weak and weaning is slow.  ASSESSMENT / PLAN:  PULMONARY A: Acute hypoxic respiratory failure 2nd to CAP and ARDS No evidence new infiltrates P:   ARDS protocol Wean cpap 5 ps 5, goal 2 hrs May need trach for wean  CARDIOVASCULAR A:  Septic shock off pressors 07/20/15 HTN Tachycardia, asymptomatic, improving P:  Maintain negative balance Continue Lasix 40 mg po bid for now  RENAL  Recent Labs Lab 07/24/15 0238 07/25/15 0615 07/26/15 0202  NA 147* 141 141    Recent Labs Lab 07/24/15 0238 07/25/15 0615 07/26/15 0202  K 3.1* 2.8* 4.3  A:  Hypokalemia; resolved P:   BMP, mag in AM Keep negative goals Lasix as above  GASTROINTESTINAL A:   Nutrition P:   Tube feeds  Protonix   HEMATOLOGIC A:   Leukocytosis; 2/2 steroids Normocytic Anemia DVT PPx P:  SQ heparin  INFECTIOUS A:   Septic shock 2nd to Pneumococcal lobar pneumonia, no risk factor for res step pNA Fever; resolved Right arm erythema, left arm bullae P:   Discontinue Vanc, continue Ceftaz Follow blood cultures May need PICC for further IV access  ENDOCRINE A:   No issues P:   Monitor CBG while on tube feeds  NEUROLOGIC A:   Acute encephalopathy 2nd to sepsis; significantly improved.  H/o of headaches, chronic back pain, anxiety, depression Generalized weakness P:  Daily WUA Hold home zanaflex/tramadol/mobic/neurontin/prozac  Fentanyl to RASS goal 0 PT   Natasha Bence, MD PGY-3, Internal Medicine Pager: [phone]  07/26/2015, 9:34 AM

## 2015-07-27 ENCOUNTER — Inpatient Hospital Stay (HOSPITAL_COMMUNITY): Payer: Self-pay

## 2015-07-27 LAB — CBC
HEMATOCRIT: 36.3 % (ref 36.0–46.0)
HEMOGLOBIN: 11.3 g/dL — AB (ref 12.0–15.0)
MCH: 29.1 pg (ref 26.0–34.0)
MCHC: 31.1 g/dL (ref 30.0–36.0)
MCV: 93.6 fL (ref 78.0–100.0)
Platelets: 259 10*3/uL (ref 150–400)
RBC: 3.88 MIL/uL (ref 3.87–5.11)
RDW: 14.7 % (ref 11.5–15.5)
WBC: 17.6 10*3/uL — AB (ref 4.0–10.5)

## 2015-07-27 LAB — BASIC METABOLIC PANEL
ANION GAP: 8 (ref 5–15)
BUN: 18 mg/dL (ref 6–20)
CHLORIDE: 100 mmol/L — AB (ref 101–111)
CO2: 29 mmol/L (ref 22–32)
Calcium: 9 mg/dL (ref 8.9–10.3)
Creatinine, Ser: 0.43 mg/dL — ABNORMAL LOW (ref 0.44–1.00)
Glucose, Bld: 110 mg/dL — ABNORMAL HIGH (ref 65–99)
POTASSIUM: 4.3 mmol/L (ref 3.5–5.1)
SODIUM: 137 mmol/L (ref 135–145)

## 2015-07-27 LAB — CULTURE, BLOOD (ROUTINE X 2)
CULTURE: NO GROWTH
Culture: NO GROWTH

## 2015-07-27 LAB — GLUCOSE, CAPILLARY
GLUCOSE-CAPILLARY: 107 mg/dL — AB (ref 65–99)
GLUCOSE-CAPILLARY: 119 mg/dL — AB (ref 65–99)
GLUCOSE-CAPILLARY: 112 mg/dL — AB (ref 65–99)
Glucose-Capillary: 103 mg/dL — ABNORMAL HIGH (ref 65–99)

## 2015-07-27 LAB — MAGNESIUM: Magnesium: 1.7 mg/dL (ref 1.7–2.4)

## 2015-07-27 LAB — PHOSPHORUS: PHOSPHORUS: 3.3 mg/dL (ref 2.5–4.6)

## 2015-07-27 LAB — ALDOLASE: ALDOLASE: 12.5 U/L — AB (ref 3.3–10.3)

## 2015-07-27 MED ORDER — MAGNESIUM SULFATE 2 GM/50ML IV SOLN
2.0000 g | Freq: Once | INTRAVENOUS | Status: AC
  Administered 2015-07-27: 2 g via INTRAVENOUS
  Filled 2015-07-27: qty 50

## 2015-07-27 MED ORDER — POTASSIUM CHLORIDE 20 MEQ/15ML (10%) PO SOLN
40.0000 meq | Freq: Two times a day (BID) | ORAL | Status: AC
  Administered 2015-07-27 (×2): 40 meq
  Filled 2015-07-27 (×2): qty 30

## 2015-07-27 MED ORDER — FLUOXETINE HCL 20 MG/5ML PO SOLN
40.0000 mg | Freq: Every day | ORAL | Status: DC
  Administered 2015-07-27 – 2015-08-03 (×8): 40 mg via ORAL
  Filled 2015-07-27 (×8): qty 10

## 2015-07-27 MED ORDER — FUROSEMIDE 10 MG/ML PO SOLN
40.0000 mg | Freq: Two times a day (BID) | ORAL | Status: AC
  Administered 2015-07-27 – 2015-07-28 (×2): 40 mg via ORAL
  Filled 2015-07-27 (×2): qty 4

## 2015-07-27 NOTE — Progress Notes (Signed)
PT Cancellation Note  Patient Details Name: Cynthia Murray MRN: [id number] DOB: 10/27/1970   Cancelled Treatment:    Reason Eval/Treat Not Completed: Patient declined, no reason specified Pt grimacing in pain. Declines working with therapy today. Will follow up.   Marguarite Arbour A Santhosh Gulino 07/27/2015, 1:07 PM  Wray Kearns, [place], DPT [phone]

## 2015-07-27 NOTE — Progress Notes (Signed)
PULMONARY / CRITICAL CARE MEDICINE   Name: Cynthia Murray MRN: [id number] DOB: 16-Apr-1971    ADMISSION DATE:  07/17/2015  REFERRING MD:  Lovie Macadamia EDP   CHIEF COMPLAINT:  Short of breath  SUBJECTIVE:  No significant overnight events. 4.5 L out over past 24 hours. Slightly increased WBC's today, no fever overnight. C. Diff negative.   VITAL SIGNS: BP 114/79 mmHg  Pulse 114  Temp(Src) 99 F (37.2 C) (Oral)  Resp 17  Ht 5' 7"  (1.702 m)  Wt 271 lb 6.2 oz (123.1 kg)  BMI 42.50 kg/m2  SpO2 95%  LMP  (LMP Unknown)  HEMODYNAMICS:    VENTILATOR SETTINGS: Vent Mode:  [-] CPAP;PSV FiO2 (%):  [40 %] 40 % Set Rate:  [18 bmp] 18 bmp Vt Set:  [450 mL] 450 mL PEEP:  [5 cmH20] 5 cmH20 Pressure Support:  [5 cmH20] 5 cmH20 Plateau Pressure:  [15 cmH20-18 cmH20] 15 cmH20  INTAKE / OUTPUT: I/O last 3 completed shifts: In: 3480.6 [I.V.:730.6; NG/GT:2650; IV Piggyback:100] Out: 1694 [Urine:6025; Stool:125]  PHYSICAL EXAMINATION: General: Alert, intubated. No distress.  Neuro: RASS 0, follows commands. Moves extremities spontaneously, general weakness.  HEENT: ETT in place, PERRL, EOMI.  Cardiac: S1/S2 regular, mildly tachycardic Chest: Air entry equal bilaterally, scattered rhonchi and rales. No wheezes.  Abd: Soft, non-tender, non-distended, BS present.  Ext: +2 pitting edema. Left arm with fluid filled bulla, improved. Right forearm with erythema. Good pulses. Skin: Right arm erythema, left arm bulla.   LABS:  BMET  Recent Labs Lab 07/25/15 0615 07/26/15 0202 07/27/15 0248  NA 141 141 137  K 2.8* 4.3 4.3  CL 99* 100* 100*  CO2 32 31 29  BUN 17 16 18   CREATININE 0.36* 0.39* 0.43*  GLUCOSE 105* 118* 110*    Electrolytes  Recent Labs Lab 07/25/15 0615 07/25/15 1510 07/26/15 0202 07/27/15 0248  CALCIUM 8.7*  --  8.6* 9.0  MG 1.7 2.3 1.9 1.7  PHOS 3.4  --  3.0 3.3    CBC  Recent Labs Lab 07/24/15 0238 07/25/15 0615 07/27/15 0248  WBC 13.4* 13.8* 17.6*   HGB 11.2* 11.4* 11.3*  HCT 36.5 35.0* 36.3  PLT 249 234 259     ABG  Recent Labs Lab 07/21/15 0248 07/21/15 1543 07/22/15 0426  PHART 7.354 7.385 7.340*  PCO2ART 55.7* 58.2* 63.8*  PO2ART 66.0* 65.0* 62.0*    Liver Enzymes  Recent Labs Lab 07/24/15 0238  AST 24  ALT 26  ALKPHOS 109  BILITOT 0.5  ALBUMIN 2.3*     Glucose  Recent Labs Lab 07/26/15 1158 07/26/15 1608 07/26/15 2030 07/27/15 0049 07/27/15 0438 07/27/15 0841  GLUCAP 121* 117* 133* 103* 107* 119*    Imaging No results found.   STUDIES:  12/31 CT chest >> Lobar consolidation LUL with widespread infiltrates 12/31 CT head >> negative 1/4 CXR >> improved   CULTURES: 12/31 Blood ([place]) >> NEG 12/31 Pneumococcal Ag >> POSITIVE 12/31 Legionella Ag >> NEG 12/31 Sputum >> NEG 01/01 HIV >> NEG 01/06 Sputum >>  01/05 Blood >>  01/09 C. Diff. PCR >> NEGATIVE  ANTIBIOTICS: Vanc 12/31 >>1/2  Vanc 1/6 (fever) >> 1/8 Zosyn 12/31 >>1/2 Ceftriaxone 1/2 >>> 1/6 Ceftaz 1/6 (fever) >>> 1/9  SIGNIFICANT EVENTS: 12/31 Transfer from [place], [place], Sepsis 01/01 ARDS protocol 1/2 Prone required 1/3 Supine 1/9 All antibiotics discontinued  LINES/TUBES: 12/31 ETT >> 12/31 LIJ CVL >> 1/6 1/1 R Radial A-line>>> 1/6  DISCUSSION: 45 yo female with septic shock, ARDS from  pneumococcal lobar pneumonia. Weak and weaning is slow.  ASSESSMENT / PLAN:  PULMONARY A: Acute hypoxic respiratory failure 2nd to CAP and ARDS No evidence new infiltrates P:   ARDS protocol Wean cpap 5 ps 5, goal 2 hrs May need trach for wean  CARDIOVASCULAR A:  Septic shock off pressors 07/20/15 HTN Tachycardia, asymptomatic, improving P:  Maintain negative balance Continue Lasix 40 mg po bid for now  RENAL  Recent Labs Lab 07/25/15 0615 07/26/15 0202 07/27/15 0248  NA 141 141 137    Recent Labs Lab 07/25/15 0615 07/26/15 0202 07/27/15 0248  K 2.8* 4.3 4.3  A:  Mild hypomagnesemia P:   BMP,  mag in AM Keep negative goals Lasix as above  GASTROINTESTINAL A:   Nutrition P:   Tube feeds  Protonix   HEMATOLOGIC A:   Leukocytosis; slightly increased this AM Normocytic Anemia DVT PPx P:  SQ heparin Repeat CBC in AM  INFECTIOUS A:   Septic shock 2nd to Pneumococcal lobar pneumonia, no risk factor for res step pNA C. Diff negative P:   Cultures continue to be negative May need PICC for further IV access  ENDOCRINE A:   No issues P:   Monitor CBG while on tube feeds  NEUROLOGIC A:   Acute encephalopathy; resolved.  H/o of headaches, chronic back pain, anxiety, depression Generalized weakness P:  RASS goal 0 Hold home zanaflex/tramadol/mobic/neurontin/prozac  Fentanyl prn Continue PT; recommend CIR   Natasha Bence, MD PGY-3, Internal Medicine Pager: [phone]  07/27/2015, 8:43 AM

## 2015-07-28 ENCOUNTER — Inpatient Hospital Stay (HOSPITAL_COMMUNITY): Payer: Self-pay

## 2015-07-28 LAB — CBC WITH DIFFERENTIAL/PLATELET
BASOS PCT: 0 %
Basophils Absolute: 0 10*3/uL (ref 0.0–0.1)
Eosinophils Absolute: 0.7 10*3/uL (ref 0.0–0.7)
Eosinophils Relative: 4 %
HEMATOCRIT: 38.1 % (ref 36.0–46.0)
HEMOGLOBIN: 12.3 g/dL (ref 12.0–15.0)
LYMPHS ABS: 3 10*3/uL (ref 0.7–4.0)
LYMPHS PCT: 16 %
MCH: 29.4 pg (ref 26.0–34.0)
MCHC: 32.3 g/dL (ref 30.0–36.0)
MCV: 91.1 fL (ref 78.0–100.0)
MONO ABS: 1.1 10*3/uL — AB (ref 0.1–1.0)
MONOS PCT: 6 %
NEUTROS ABS: 14 10*3/uL — AB (ref 1.7–7.7)
NEUTROS PCT: 74 %
Platelets: 323 10*3/uL (ref 150–400)
RBC: 4.18 MIL/uL (ref 3.87–5.11)
RDW: 14.5 % (ref 11.5–15.5)
WBC: 18.7 10*3/uL — ABNORMAL HIGH (ref 4.0–10.5)

## 2015-07-28 LAB — BASIC METABOLIC PANEL
ANION GAP: 12 (ref 5–15)
BUN: 16 mg/dL (ref 6–20)
CALCIUM: 9.4 mg/dL (ref 8.9–10.3)
CHLORIDE: 97 mmol/L — AB (ref 101–111)
CO2: 28 mmol/L (ref 22–32)
Creatinine, Ser: 0.48 mg/dL (ref 0.44–1.00)
GFR calc non Af Amer: >60 mL/min (ref >60)
GLUCOSE: 91 mg/dL (ref 65–99)
Potassium: 4.7 mmol/L (ref 3.5–5.1)
Sodium: 137 mmol/L (ref 135–145)

## 2015-07-28 LAB — PHOSPHORUS: Phosphorus: 4.1 mg/dL (ref 2.5–4.6)

## 2015-07-28 LAB — MAGNESIUM: MAGNESIUM: 2 mg/dL (ref 1.7–2.4)

## 2015-07-28 MED ORDER — DEXAMETHASONE SODIUM PHOSPHATE 4 MG/ML IJ SOLN
4.0000 mg | Freq: Four times a day (QID) | INTRAMUSCULAR | Status: AC
  Administered 2015-07-28 – 2015-07-29 (×4): 4 mg via INTRAVENOUS
  Filled 2015-07-28 (×4): qty 1

## 2015-07-28 MED ORDER — FUROSEMIDE 10 MG/ML IJ SOLN
40.0000 mg | Freq: Once | INTRAMUSCULAR | Status: AC
  Administered 2015-07-28: 40 mg via INTRAVENOUS
  Filled 2015-07-28: qty 4

## 2015-07-28 MED ORDER — RACEPINEPHRINE HCL 2.25 % IN NEBU
INHALATION_SOLUTION | RESPIRATORY_TRACT | Status: AC
  Filled 2015-07-28: qty 0.5

## 2015-07-28 MED ORDER — CETYLPYRIDINIUM CHLORIDE 0.05 % MT LIQD
7.0000 mL | Freq: Two times a day (BID) | OROMUCOSAL | Status: DC
  Administered 2015-07-28 (×2): 7 mL via OROMUCOSAL

## 2015-07-28 MED ORDER — METOPROLOL TARTRATE 1 MG/ML IV SOLN
2.5000 mg | Freq: Four times a day (QID) | INTRAVENOUS | Status: DC
  Administered 2015-07-29 (×2): 2.5 mg via INTRAVENOUS
  Filled 2015-07-28 (×6): qty 5

## 2015-07-28 MED ORDER — FUROSEMIDE 10 MG/ML PO SOLN: 40.0000 mg | Freq: Every day | ORAL | Status: DC

## 2015-07-28 MED ORDER — METOPROLOL TARTRATE 1 MG/ML IV SOLN
25.0000 mg | Freq: Two times a day (BID) | INTRAVENOUS | Status: DC
  Administered 2015-07-28: 5 mg via INTRAVENOUS
  Filled 2015-07-28 (×2): qty 25

## 2015-07-28 MED ORDER — CHLORHEXIDINE GLUCONATE 0.12 % MT SOLN
15.0000 mL | Freq: Two times a day (BID) | OROMUCOSAL | Status: DC
  Administered 2015-07-28 – 2015-07-29 (×2): 15 mL via OROMUCOSAL

## 2015-07-28 MED ORDER — FENTANYL CITRATE (PF) 100 MCG/2ML IJ SOLN: 12.5000 ug | INTRAMUSCULAR | Status: DC | PRN

## 2015-07-28 MED ORDER — METOPROLOL TARTRATE 1 MG/ML IV SOLN: 2.5000 mg | Freq: Four times a day (QID) | INTRAVENOUS | Status: DC

## 2015-07-28 NOTE — Progress Notes (Signed)
Physical Therapy Treatment Patient Details Name: Cynthia Murray MRN: [id number] DOB: [birth year] Today's Date: 07/28/2015    History of Present Illness pt is a 45 y/o female with h/o HTN admitted from [facility] with cough fever, chills, SOB, pleuritic pain, HA.  CXR showed LLL PNA, ETT placed on arrival, 1/1   ARDS protocol initiated.     PT Comments    Patient progressing slowly towards PT goals. Extubated this AM. Continues to exhibit increased work of breathing especially when fatigued. Tolerated there ex EOB and working on dynamic sitting balance and activation of trunk musculature. Pt with delayed response time to questions asked and to perform tasks. Will plan for OOB as tolerated next session. Will continue to follow per current POC.  Follow Up Recommendations  CIR     Equipment Recommendations  Other (comment) (TBD)    Recommendations for Other Services       Precautions / Restrictions Precautions Precautions: Fall Precaution Comments: face tent 10L/min 02 Restrictions Weight Bearing Restrictions: No    Mobility  Bed Mobility Overal bed mobility: Needs Assistance Bed Mobility: Supine to Sit;Sit to Supine     Supine to sit: Max assist;HOB elevated Sit to supine: Mod assist   General bed mobility comments: Assist with BLEs, to elevate trunk and scoot bottom to EOB. Step by step cues for technique. Assist to bring BLEs into bed. Uncontrolled descent into bed.  Transfers                 General transfer comment: stand not attempted as pt reports fatigue, increased WOB and elevated HR.  Ambulation/Gait                 Stairs            Wheelchair Mobility    Modified Rankin (Stroke Patients Only)       Balance Overall balance assessment: Needs assistance Sitting-balance support: Feet supported;Single extremity supported;No upper extremity supported Sitting balance-Leahy Scale: Fair Sitting balance - Comments: Sat EOB ~15 minutes  working on sitting balance, anterior trunk leans to activate abdominals, scooting to EOB with Mod A to maintain balance.  Postural control: Posterior lean                          Cognition Arousal/Alertness: Awake/alert Behavior During Therapy: Anxious Overall Cognitive Status: No family/caregiver present to determine baseline cognitive functioning Area of Impairment: Problem solving;Following commands             Problem Solving: Slow processing;Decreased initiation;Requires verbal cues      Exercises General Exercises - Lower Extremity Ankle Circles/Pumps: Both;15 reps;Supine Long Arc Quad: Both;20 reps;Seated    General Comments General comments (skin integrity, edema, etc.): VSS, HRs 120s-139 bpm during session. Increased WOB.      Pertinent Vitals/Pain Pain Assessment: No/denies pain    Home Living                      Prior Function            PT Goals (current goals can now be found in the care plan section) Progress towards PT goals: Progressing toward goals    Frequency  Min 3X/week    PT Plan Current plan remains appropriate    Co-evaluation             End of Session Equipment Utilized During Treatment: Oxygen (face tent) Activity Tolerance: Patient limited by fatigue Patient left: in bed;with  call bell/phone within reach;with bed alarm set     Time: 1400-1429 PT Time Calculation (min) (ACUTE ONLY): 29 min  Charges:  $Therapeutic Activity: 8-22 mins $Neuromuscular Re-education: 8-22 mins                    G Codes:      Muslima Toppins A Tiffnay Bossi 07/28/2015, 3:30 PM Wray Kearns, [place], DPT [phone]

## 2015-07-28 NOTE — Care Management Note (Signed)
Case Management Note  Patient Details  Name: Janiaya Ryser MRN: [id number] Date of Birth: 03-18-1971  Subjective/Objective:       Lying in bed awake, sister Levada Dy at bedside. Patient responding to questions by nodding head and trying to talk.  Remains intubated with ETT.  Per sister and patient nods agreement, lives at home with her father and other sister.  Hope to get back there.  Both agree with needing some kind of rehab probably prior to going home.  Rehab recommending CIR, I would agree with this at this point.  Hope to extubated soon.  CM will continue to follow.    Action/Plan:   Expected Discharge Date:                  Expected Discharge Plan:  IP Rehab Facility  In-House Referral:  Clinical Social Work  Discharge planning Services  CM Consult  Post Acute Care Choice:    Choice offered to:     DME Arranged:    DME Agency:     HH Arranged:    [place] Agency:     Status of Service:  In process, will continue to follow  Medicare Important Message Given:    Date Medicare IM Given:    Medicare IM give by:    Date Additional Medicare IM Given:    Additional Medicare Important Message give by:     If discussed at [place] of Stay Meetings, dates discussed:    Additional Comments:  Vergie Living, RN 07/28/2015, 10:52 AM

## 2015-07-28 NOTE — Progress Notes (Signed)
Wasted 225 ml of fentanyl with second Biomedical engineer.

## 2015-07-28 NOTE — Procedures (Signed)
Extubation Procedure Note  Patient Details:   Name: Cynthia Murray DOB: 08/10/1970 MRN: [id number]   Airway Documentation:     Evaluation  O2 sats: stable throughout Complications: No apparent complications Patient did tolerate procedure well. Bilateral Breath Sounds: Clear, Diminished Suctioning: Airway Yes  Initially placed on 4L [place] Reassessed pt and had mild to moderate stridor Placed on 40% Aerosol mask per Dr. Maggie Schwalbe, Jones Skene 07/28/2015, 10:42 AM

## 2015-07-28 NOTE — Progress Notes (Signed)
PULMONARY / CRITICAL CARE MEDICINE   Name: Cynthia Murray MRN: [id number] DOB: 10-06-1970    ADMISSION DATE:  07/17/2015  REFERRING MD:  Lovie Macadamia EDP   CHIEF COMPLAINT:  Short of breath  SUBJECTIVE:  Mental status appears to be at baseline. Patient removed Cortrak yesterday, OGT replaced. Patient appears ready for extubation this AM. Strength significantly improved in upper and lower extremities. 4L urine output overnight.   VITAL SIGNS: BP 106/82 mmHg  Pulse 103  Temp(Src) 98.2 F (36.8 C) (Oral)  Resp 18  Ht 5' 7"  (1.702 m)  Wt 265 lb 3.4 oz (120.3 kg)  BMI 41.53 kg/m2  SpO2 100%  LMP  (LMP Unknown)  HEMODYNAMICS:    VENTILATOR SETTINGS: Vent Mode:  [-] PRVC FiO2 (%):  [40 %] 40 % Set Rate:  [18 bmp] 18 bmp Vt Set:  [450 mL] 450 mL PEEP:  [5 cmH20] 5 cmH20 Pressure Support:  [10 cmH20] 10 cmH20 Plateau Pressure:  [8 cmH20-16 cmH20] 16 cmH20  INTAKE / OUTPUT: I/O last 3 completed shifts: In: 2426 [I.V.:836; NG/GT:1540; IV Piggyback:50] Out: 0347 [QQVZD:6387; Stool:25]  PHYSICAL EXAMINATION: General: Alert, intubated. No distress.  Neuro: RASS 0, follows commands. Moves extremities spontaneously, general weakness improved.  HEENT: ETT in place, PERRL, EOMI.  Cardiac: S1/S2 regular, tachycardic Chest: Air entry equal bilaterally, scattered rhonchi and rales. No wheezes.  Abd: Soft, non-tender, non-distended, BS present.  Ext: +1 pitting edema. Left arm with fluid filled bulla, improved. Right forearm with erythema. Good pulses. Skin: Right arm erythema, left arm bulla.   LABS:  BMET  Recent Labs Lab 07/26/15 0202 07/27/15 0248 07/28/15 0212  NA 141 137 137  K 4.3 4.3 4.7  CL 100* 100* 97*  CO2 31 29 28   BUN 16 18 16   CREATININE 0.39* 0.43* 0.48  GLUCOSE 118* 110* 91    Electrolytes  Recent Labs Lab 07/26/15 0202 07/27/15 0248 07/28/15 0212  CALCIUM 8.6* 9.0 9.4  MG 1.9 1.7 2.0  PHOS 3.0 3.3 4.1    CBC  Recent Labs Lab  07/25/15 0615 07/27/15 0248 07/28/15 0212  WBC 13.8* 17.6* 18.7*  HGB 11.4* 11.3* 12.3  HCT 35.0* 36.3 38.1  PLT 234 259 323     ABG  Recent Labs Lab 07/21/15 1543 07/22/15 0426  PHART 7.385 7.340*  PCO2ART 58.2* 63.8*  PO2ART 65.0* 62.0*    Liver Enzymes  Recent Labs Lab 07/24/15 0238  AST 24  ALT 26  ALKPHOS 109  BILITOT 0.5  ALBUMIN 2.3*     Glucose  Recent Labs Lab 07/26/15 1608 07/26/15 2030 07/27/15 0049 07/27/15 0438 07/27/15 0841 07/27/15 1122  GLUCAP 117* 133* 103* 107* 119* 112*    Imaging Dg Chest Port 1 View  07/28/2015  CLINICAL DATA:  Respiratory failure, pneumonia, sepsis, ARDS. EXAM: PORTABLE CHEST 1 VIEW COMPARISON:  Portable chest x-ray of July 27, 2015 FINDINGS: The lungs are slightly better inflated today. Aeration of the left upper lung has improved. There is persistent interstitial density at the right lung base. On the left the mildly increased airspace densities persists. There remains partial obscuration of the left hemidiaphragm. A left pleural effusion cannot be excluded. The cardiac silhouette is top-normal in size. The pulmonary vascularity is not engorged. The endotracheal tube tip lies 5.6 cm above the carina. The esophagogastric tube tip projects below the inferior margin of the image. IMPRESSION: Persistent bibasilar and left perihilar atelectasis or pneumonia. There may be a left pleural effusion layering posteriorly. There is no significant pulmonary  edema. Electronically Signed   By: David  Martinique M.D.   On: 07/28/2015 08:20   Dg Chest Port 1 View  07/27/2015  CLINICAL DATA:  Evaluate pulmonary edema EXAM: PORTABLE CHEST 1 VIEW COMPARISON:  07/26/2015 FINDINGS: No unchanged endotracheal tube or orogastric tube. Discoid atelectasis right lung base similar to prior study. Hazy opacity over the left thorax unchanged suggesting a combination of pleural effusion and alveolar opacity. Mildly decreased vascular congestion. Decreased  interstitial prominence. IMPRESSION: Improving pulmonary edema with persistent hazy density left thorax suggesting pleural effusion. Electronically Signed   By: Skipper Cliche M.D.   On: 07/27/2015 09:05   Dg Abd Portable 1v  07/27/2015  CLINICAL DATA:  Encounter for orogastric tube placement. EXAM: PORTABLE ABDOMEN - 1 VIEW COMPARISON:  Abdominal radiograph 07/19/2015 FINDINGS: Tip and side port of the enteric tube below the diaphragm in the stomach, the tip is directed towards the fundus. No dilated bowel loops in the upper abdomen. Air seen within the colon. IMPRESSION: Tip and side port of the enteric tube below the diaphragm in the stomach, tip directed towards the fundus. Electronically Signed   By: Jeb Levering M.D.   On: 07/27/2015 19:22     STUDIES:  12/31 CT chest >> Lobar consolidation LUL with widespread infiltrates 12/31 CT head >> negative 1/4 CXR >> improved   CULTURES: 12/31 Blood ([place]) >> NEG 12/31 Pneumococcal Ag >> POSITIVE 12/31 Legionella Ag >> NEG 12/31 Sputum >> NEG 01/01 HIV >> NEG 01/06 Sputum >>  01/05 Blood >>  01/09 C. Diff. PCR >> NEGATIVE  ANTIBIOTICS: Vanc 12/31 >>1/2  Vanc 1/6 (fever) >> 1/8 Zosyn 12/31 >>1/2 Ceftriaxone 1/2 >>> 1/6 Ceftaz 1/6 (fever) >>> 1/9  SIGNIFICANT EVENTS: 12/31 Transfer from [place], [place], Sepsis 01/01 ARDS protocol 1/2 Prone required 1/3 Supine 1/9 All antibiotics discontinued 1/10 Patient removed her own Cortrak, replaced with OGT  LINES/TUBES: 12/31 ETT >> 12/31 LIJ CVL >> 1/6 1/1 R Radial A-line>>> 1/6  DISCUSSION: 45 y/o female with septic shock, ARDS from pneumococcal lobar pneumonia. Weak and weaning is slow.  ASSESSMENT / PLAN:  PULMONARY A: Acute hypoxic respiratory failure 2nd to CAP and ARDS Pulmonary edema/pleural effusions, improving P:   Wean cpap 5 ps 5, hopeful extubation today   CARDIOVASCULAR A:  Septic shock; resolved HTN Tachycardia P:  Maintain negative  balance Decrease Lasix to 40 mg po daily  RENAL A:  No acute issues P:   BMP, mag in AM Lasix as above  GASTROINTESTINAL A:   Nutrition P:   Hold tube feeds for likely extubation Protonix   HEMATOLOGIC A:   Leukocytosis; slightly increased Normocytic Anemia DVT PPx P:  SQ heparin Repeat CBC in AM  INFECTIOUS A:   Septic shock 2nd to Pneumococcal lobar pneumonia C. Diff negative P:   Cultures continue to be negative May need PICC for further IV access  ENDOCRINE A:   No issues P:   Monitor CBG while on tube feeds  NEUROLOGIC A:   Acute encephalopathy; resolved.  H/o of headaches, chronic back pain, anxiety, depression Generalized weakness P:  RASS goal 0 Continue Buspar, Prozac Hold home zanaflex/tramadol/mobic/neurontin Fentanyl prn Continue PT; recommend CIR   Natasha Bence, MD PGY-3, Internal Medicine Pager: [phone]  07/28/2015, 8:36 AM

## 2015-07-29 DIAGNOSIS — G9341 Metabolic encephalopathy: Secondary | ICD-10-CM

## 2015-07-29 LAB — BASIC METABOLIC PANEL
ANION GAP: 11 (ref 5–15)
BUN: 18 mg/dL (ref 6–20)
CALCIUM: 9.7 mg/dL (ref 8.9–10.3)
CO2: 26 mmol/L (ref 22–32)
CREATININE: 0.48 mg/dL (ref 0.44–1.00)
Chloride: 100 mmol/L — ABNORMAL LOW (ref 101–111)
Glucose, Bld: 116 mg/dL — ABNORMAL HIGH (ref 65–99)
Potassium: 4 mmol/L (ref 3.5–5.1)
SODIUM: 137 mmol/L (ref 135–145)

## 2015-07-29 LAB — CBC
HCT: 38.2 % (ref 36.0–46.0)
HEMOGLOBIN: 12.8 g/dL (ref 12.0–15.0)
MCH: 29.6 pg (ref 26.0–34.0)
MCHC: 33.5 g/dL (ref 30.0–36.0)
MCV: 88.4 fL (ref 78.0–100.0)
PLATELETS: 455 10*3/uL — AB (ref 150–400)
RBC: 4.32 MIL/uL (ref 3.87–5.11)
RDW: 14.1 % (ref 11.5–15.5)
WBC: 15.6 10*3/uL — AB (ref 4.0–10.5)

## 2015-07-29 LAB — MAGNESIUM: MAGNESIUM: 2.2 mg/dL (ref 1.7–2.4)

## 2015-07-29 LAB — PHOSPHORUS: Phosphorus: 4.3 mg/dL (ref 2.5–4.6)

## 2015-07-29 MED ORDER — POTASSIUM CHLORIDE CRYS ER 20 MEQ PO TBCR
40.0000 meq | EXTENDED_RELEASE_TABLET | Freq: Once | ORAL | Status: AC
  Administered 2015-07-29: 40 meq via ORAL
  Filled 2015-07-29: qty 2

## 2015-07-29 MED ORDER — METOPROLOL TARTRATE 25 MG PO TABS
25.0000 mg | ORAL_TABLET | Freq: Two times a day (BID) | ORAL | Status: DC
  Administered 2015-07-29 – 2015-08-02 (×8): 25 mg via ORAL
  Filled 2015-07-29 (×9): qty 1

## 2015-07-29 MED ORDER — FUROSEMIDE 40 MG PO TABS
40.0000 mg | ORAL_TABLET | Freq: Two times a day (BID) | ORAL | Status: AC
  Administered 2015-07-29 – 2015-07-30 (×2): 40 mg via ORAL
  Filled 2015-07-29 (×4): qty 1

## 2015-07-29 MED ORDER — FAMOTIDINE 20 MG PO TABS
20.0000 mg | ORAL_TABLET | Freq: Two times a day (BID) | ORAL | Status: DC
  Administered 2015-07-30 – 2015-08-09 (×21): 20 mg via ORAL
  Filled 2015-07-29 (×23): qty 1

## 2015-07-29 MED ORDER — GLUCERNA SHAKE PO LIQD
237.0000 mL | Freq: Two times a day (BID) | ORAL | Status: DC
  Administered 2015-07-29 – 2015-08-09 (×23): 237 mL via ORAL

## 2015-07-29 MED ORDER — RESOURCE THICKENUP CLEAR PO POWD
ORAL | Status: DC | PRN
  Filled 2015-07-29: qty 125

## 2015-07-29 NOTE — Progress Notes (Signed)
Nutrition Follow-up  DOCUMENTATION CODES:   Morbid obesity  INTERVENTION:    Glucerna Shake po BID, thicken to appropriate consistency, each supplement provides 220 kcal and 10 grams of protein   NUTRITION DIAGNOSIS:   Inadequate oral intake related to dysphagia as evidenced by per patient/family report.  Ongoing  GOAL:   Patient will meet greater than or equal to 90% of their needs  Unmet  MONITOR:   PO intake  ASSESSMENT:   45 yo WF smoker transferred to [facility] from [facility] ER. Being treated for septic shock, ARDS from pneumococcal lobar pneumonia.  S/P swallow evaluation with SLP this AM. Diet has been advanced to dysphagia 3 diet with nectar thick liquids. Suspect intake will be poor given recent prolonged intubation and restrictive diet. Will add PO supplements.   Diet Order:  DIET DYS 3 Room service appropriate?: Yes; Fluid consistency:: Nectar Thick  Skin:  Reviewed, no issues  Last BM:  1/11  Height:   Ht Readings from Last 1 Encounters:  07/17/15 5' 7"  (1.702 m)    Weight:   Wt Readings from Last 1 Encounters:  07/28/15 265 lb 3.4 oz (120.3 kg)    Ideal Body Weight:  61.4 kg  BMI:  Body mass index is 41.53 kg/(m^2).  Estimated Nutritional Needs:   Kcal:  1800-2000  Protein:  110-120 gm  Fluid:  1.8-2 L  EDUCATION NEEDS:   No education needs identified at this time  Molli Barrows, [place], [place], [place] Pager [phone] After Hours Pager [phone]

## 2015-07-29 NOTE — Evaluation (Signed)
Clinical/Bedside Swallow Evaluation Patient Details  Name: Cynthia Murray MRN: [id number] Date of Birth: 11/02/1970  Today's Date: 07/29/2015 Time: SLP Start Time (ACUTE ONLY): 0816 SLP Stop Time (ACUTE ONLY): 0835 SLP Time Calculation (min) (ACUTE ONLY): 19 min  Past Medical History:  Past Medical History  Diagnosis Date  . Hypertension    Past Surgical History:  Past Surgical History  Procedure Laterality Date  . Hand surgery  1994    left hand  . Removal of cyst between lungs  1998  . Appendectomy  2004  . Cholecystectomy  2004   HPI:  45 y/o female with septic shock, ARDS from pneumococcal lobar pneumonia. Intubated from 12/31 to 1/11.    Assessment / Plan / Recommendation Clinical Impression  Pt demonstrates signs of an acute reversible dysphagia follow prolonged intubation. Pt has hoarse vocal quality and immediate cough with thin liquids suggestive of vocal fold edema/irritation due to intubation.  Assist provided to reduce bolus size with no improvement. Pt tolerated nectar thick liquids and solids with otherwise strong timely swallow. Recommend a Dys 3 (mechanical soft diet) with nectar thick liquids. Will f/u for tolerance and upgrade as pt improves.     Aspiration Risk  Moderate aspiration risk    Diet Recommendation Dysphagia 3 (Mech soft);Nectar-thick liquid   Liquid Administration via: Cup;Straw Medication Administration: Whole meds with puree Supervision: Staff to assist with self feeding Postural Changes: Seated upright at 90 degrees    Other  Recommendations Other Recommendations: Order thickener from pharmacy   Follow up Recommendations  Inpatient Rehab    Frequency and Duration min 2x/week  2 weeks       Prognosis        Swallow Study   General HPI: 45 y/o female with septic shock, ARDS from pneumococcal lobar pneumonia. Intubated from 12/31 to 1/11.  Type of Study: Bedside Swallow Evaluation Previous Swallow Assessment: none Diet Prior to  this Study: NPO Temperature Spikes Noted: Yes Respiratory Status: Nasal cannula History of Recent Intubation: Yes Length of Intubations (days): 12 days Date extubated: 07/28/15 Behavior/Cognition: Alert;Requires cueing;Distractible Oral Cavity Assessment: Within Functional Limits Oral Care Completed by SLP: No Oral Cavity - Dentition: Adequate natural dentition Vision: Functional for self-feeding Self-Feeding Abilities: Able to feed self;Needs assist Patient Positioning: Upright in bed Baseline Vocal Quality: Breathy;Low vocal intensity Volitional Cough: Weak Volitional Swallow: Able to elicit    Oral/Motor/Sensory Function Overall Oral Motor/Sensory Function: Within functional limits   Ice Chips     Thin Liquid Thin Liquid: Impaired Presentation: Cup;Self Fed Pharyngeal  Phase Impairments: Cough - Immediate    Nectar Thick Nectar Thick Liquid: Within functional limits Presentation: Cup;Straw;Self Fed   Honey Thick Honey Thick Liquid: Not tested   Puree Puree: Within functional limits   Solid   GO    Solid: Within functional limits      [facility], [place] CCC-SLP [phone]  Lynann Beaver 07/29/2015,9:00 AM

## 2015-07-29 NOTE — Progress Notes (Signed)
PULMONARY / CRITICAL CARE MEDICINE   Name: Charo Philipp MRN: [id number] DOB: 14-Sep-1970    ADMISSION DATE:  07/17/2015  REFERRING MD:  Lovie Macadamia EDP   CHIEF COMPLAINT:  Short of breath  SUBJECTIVE:  Doing well this AM, breathing room air. Some mild confusion, but quite improved. No overnight events.   VITAL SIGNS: BP 106/90 mmHg  Pulse 108  Temp(Src) 97.3 F (36.3 C) (Oral)  Resp 25  Ht 5' 7"  (1.702 m)  Wt 265 lb 3.4 oz (120.3 kg)  BMI 41.53 kg/m2  SpO2 94%  LMP  (LMP Unknown)  HEMODYNAMICS:    VENTILATOR SETTINGS: Vent Mode:  [-]  FiO2 (%):  [40 %] 40 %  INTAKE / OUTPUT: I/O last 3 completed shifts: In: 701.2 [I.V.:611.2; NG/GT:90] Out: 4440 [Urine:4440]  PHYSICAL EXAMINATION: General: Awake, alert, no distress. On room air.  Neuro: A&O x2. Moves extremities spontaneously, general weakness improving.  HEENT: PERRL, EOMI. Moist mucus membranes.  Cardiac: S1/S2 regular, tachycardic Chest: Air entry equal bilaterally, scattered rhonchi and rales. No wheezes.  Abd: Soft, non-tender, non-distended, BS present.  Ext: +1 pitting edema.  Skin: Warm, well perfused.   LABS:  BMET  Recent Labs Lab 07/27/15 0248 07/28/15 0212 07/29/15 0330  NA 137 137 137  K 4.3 4.7 4.0  CL 100* 97* 100*  CO2 29 28 26   BUN 18 16 18   CREATININE 0.43* 0.48 0.48  GLUCOSE 110* 91 116*    Electrolytes  Recent Labs Lab 07/27/15 0248 07/28/15 0212 07/29/15 0330  CALCIUM 9.0 9.4 9.7  MG 1.7 2.0 2.2  PHOS 3.3 4.1 4.3    CBC  Recent Labs Lab 07/27/15 0248 07/28/15 0212 07/29/15 0330  WBC 17.6* 18.7* 15.6*  HGB 11.3* 12.3 12.8  HCT 36.3 38.1 38.2  PLT 259 323 455*     Liver Enzymes  Recent Labs Lab 07/24/15 0238  AST 24  ALT 26  ALKPHOS 109  BILITOT 0.5  ALBUMIN 2.3*     Glucose  Recent Labs Lab 07/26/15 1608 07/26/15 2030 07/27/15 0049 07/27/15 0438 07/27/15 0841 07/27/15 1122  GLUCAP 117* 133* 103* 107* 119* 112*    Imaging Dg Chest  Port 1 View  07/28/2015  CLINICAL DATA:  Respiratory failure, pneumonia, sepsis, ARDS. EXAM: PORTABLE CHEST 1 VIEW COMPARISON:  Portable chest x-ray of July 27, 2015 FINDINGS: The lungs are slightly better inflated today. Aeration of the left upper lung has improved. There is persistent interstitial density at the right lung base. On the left the mildly increased airspace densities persists. There remains partial obscuration of the left hemidiaphragm. A left pleural effusion cannot be excluded. The cardiac silhouette is top-normal in size. The pulmonary vascularity is not engorged. The endotracheal tube tip lies 5.6 cm above the carina. The esophagogastric tube tip projects below the inferior margin of the image. IMPRESSION: Persistent bibasilar and left perihilar atelectasis or pneumonia. There may be a left pleural effusion layering posteriorly. There is no significant pulmonary edema. Electronically Signed   By: David  Martinique M.D.   On: 07/28/2015 08:20     STUDIES:  12/31 CT chest >> Lobar consolidation LUL with widespread infiltrates 12/31 CT head >> negative 1/4 CXR >> improved   CULTURES: 12/31 Blood ([place]) >> NEG 12/31 Pneumococcal Ag >> POSITIVE 12/31 Legionella Ag >> NEG 12/31 Sputum >> NEG 01/01 HIV >> NEG 01/06 Sputum >> NEG 01/05 Blood >> NEG 01/09 C. Diff. PCR >> NEGATIVE  ANTIBIOTICS: Vanc 12/31 >>1/2  Vanc 1/6 (fever) >> 1/8 Zosyn  12/31 >>1/2 Ceftriaxone 1/2 >>> 1/6 Ceftaz 1/6 (fever) >>> 1/9  SIGNIFICANT EVENTS: 12/31 Transfer from [place], [place], Sepsis 01/01 ARDS protocol 1/2 Prone required 1/3 Supine 1/9 All antibiotics discontinued 1/10 Patient removed her own Cortrak, replaced with OGT 1/11 Extubated  LINES/TUBES: 12/31 ETT >> 1/11 12/31 LIJ CVL >> 1/6 1/1 R Radial A-line>>> 1/6  DISCUSSION: 45 y/o female with septic shock, ARDS from pneumococcal lobar pneumonia. Weak and weaning is slow.  ASSESSMENT / PLAN:  PULMONARY A: Acute hypoxic  respiratory failure 2nd to CAP and ARDS Pulmonary edema/pleural effusions, improving P:   Continue Supplemental O2 Transfer to medical floor   CARDIOVASCULAR A:  Septic shock; resolved HTN Tachycardia P:  Hold further Lasix for now Discontinue telemetry  RENAL A:  No acute issues P:   BMP in AM  GASTROINTESTINAL A:   Nutrition P:   Regular diet after SLP eval Discontinue Protonix   HEMATOLOGIC A:   Leukocytosis Normocytic Anemia DVT PPx P:  SQ heparin  INFECTIOUS A:   Septic shock 2nd to Pneumococcal lobar pneumonia P:   Continue to monitor  ENDOCRINE A:   No issues P:   Continue to monitor  NEUROLOGIC A:   Acute encephalopathy; resolving.  H/o of headaches, chronic back pain, anxiety, depression Generalized weakness Depression/anxiety P:  Continue Buspar, Prozac Hold home zanaflex/tramadol/mobic/neurontin Fentanyl prn Continue PT; recommend CIR    Natasha Bence, MD PGY-3, Internal Medicine Pager: [phone]   07/29/2015, 7:51 AM

## 2015-07-29 NOTE — Consult Note (Signed)
Physical Medicine and Rehabilitation Consult Reason for Consult: Acute hypoxic respiratory failure/septic shock Referring Physician: Critical care   HPI: Cynthia Murray is a 45 y.o. right handed female with history of hypertension. Presented to [facility] 07/17/2015 with dyspnea, cough, chest pain and headache. Noted to be hypotensive. Labs showed lactic acidosis and AKI. Placed on broad-spectrum antibiotics. Started on pressors. CT of the chest showed lobar pneumonia of the left upper lobe with developing bronchial pneumonia throughout the remaining portions of the lungs. Cranial CT scan negative. Blood cultures negative. Patient transferred to [facility] for ongoing care. Patient did require intubation and extubated 07/28/2015. Bouts of confusion/restlessness suspect acute encephalopathy in setting of respiratory failure. Mechanical soft nectar thick liquid diet. Subcutaneous heparin for DVT prophylaxis. Physical therapy evaluation completed with recommendations of physical medicine rehabilitation consult.   Review of Systems  Unable to perform ROS: mental acuity  All other systems reviewed and are negative.  Past Medical History  Diagnosis Date  . Hypertension    Past Surgical History  Procedure Laterality Date  . Hand surgery  1994    left hand  . Removal of cyst between lungs  1998  . Appendectomy  2004  . Cholecystectomy  2004   Family History  Problem Relation Age of Onset  . Breast cancer      grandmother  . Heart attack Father   . Depression Mother     father  . Diabetes Father   . Hyperlipidemia Father   . Hypertension Father    Social History:  reports that she has been smoking Cigarettes.  She has a 15 pack-year smoking history. She does not have any smokeless tobacco history on file. She reports that she drinks alcohol. She reports that she does not use illicit drugs. Allergies: No Known Allergies Medications Prior to Admission    Medication Sig Dispense Refill  . amitriptyline (ELAVIL) 150 MG tablet Take 50-150 mg by mouth at bedtime as needed.     . Aspirin-Acetaminophen-Caffeine (GOODY HEADACHE PO) Take 1 packet by mouth 2 (two) times daily as needed (pain).    . busPIRone (BUSPAR) 10 MG tablet Take 1 tablet by mouth twice daily (Patient taking differently: Take 10 mg by mouth 2 (two) times daily. ) 60 tablet 5  . clonazePAM (KLONOPIN) 1 MG tablet Take 1 tablet (1 mg total) by mouth 3 (three) times daily as needed. for anxiety (Patient taking differently: Take 1 mg by mouth 3 (three) times daily as needed for anxiety. ) 90 tablet 3  . Dextromethorphan Polistirex (DELSYM PO) Take 10 mLs by mouth every 4 (four) hours as needed (cough).    . diphenhydrAMINE (BENADRYL) 25 mg capsule Take 50 mg by mouth at bedtime.     Marland Kitchen FLUoxetine (PROZAC) 20 MG capsule TAKE TWO CAPSULES BY MOUTH IN THE MORNING AND TAKE TWO CAPSULES IN THE EVENING 120 capsule 1  . gabapentin (NEURONTIN) 300 MG capsule Take 900 mg by mouth 4 (four) times daily.     . hydrochlorothiazide (HYDRODIURIL) 25 MG tablet One tablet by mouth every morning as needed for swelling. (Patient taking differently: Take 25 mg by mouth daily as needed (feet swelling). ) 30 tablet 0  . ibuprofen (ADVIL,MOTRIN) 200 MG tablet Take 400 mg by mouth every 12 (twelve) hours.    . medroxyPROGESTERone (DEPO-PROVERA) 150 MG/ML injection Inject 150 mg into the muscle every 3 (three) months. Last injection 06/22/15    . meloxicam (MOBIC) 15 MG tablet Take  1 tablet (15 mg total) by mouth daily. For back. 30 tablet 4  . metoprolol tartrate (LOPRESSOR) 25 MG tablet Take 2 tablets (50 mg total) by mouth 2 (two) times daily. (Patient taking differently: Take 25 mg by mouth 2 (two) times daily. Headache prophylactic) 60 tablet 5  . Pseudoeph-Doxylamine-DM-APAP (NYQUIL PO) Take 30 mLs by mouth every 4 (four) hours as needed (cough and cold symptoms).    . ranitidine (ZANTAC) 75 MG tablet Take 75 mg  by mouth 2 (two) times daily.     . tizanidine (ZANAFLEX) 6 MG capsule Take 4-6 mg by mouth 3 (three) times daily.     . traMADol (ULTRAM) 50 MG tablet Take 1 tablet (50 mg total) by mouth every 6 (six) hours as needed. (Patient taking differently: Take 50 mg by mouth every 6 (six) hours as needed (pain). ) 90 tablet 0    Home: Home Living Family/patient expects to be discharged to:: Private residence Available Help at Discharge:  (no family present to confirm)  Functional History: Prior Function Level of Independence: Independent Functional Status:  Mobility: Bed Mobility Overal bed mobility: Needs Assistance Bed Mobility: Supine to Sit, Sit to Supine Supine to sit: Max assist, HOB elevated Sit to supine: Mod assist Sit to sidelying: Max assist, +2 for physical assistance General bed mobility comments: Assist with BLEs, to elevate trunk and scoot bottom to EOB. Step by step cues for technique. Assist to bring BLEs into bed. Uncontrolled descent into bed. Transfers General transfer comment: stand not attempted as pt reports fatigue, increased WOB and elevated HR.      ADL:    Cognition: Cognition Overall Cognitive Status: No family/caregiver present to determine baseline cognitive functioning Orientation Level: Oriented to person, Oriented to place, Disoriented to time, Disoriented to situation Cognition Arousal/Alertness: Awake/alert Behavior During Therapy: Anxious Overall Cognitive Status: No family/caregiver present to determine baseline cognitive functioning Area of Impairment: Problem solving, Following commands Problem Solving: Slow processing, Decreased initiation, Requires verbal cues Difficult to assess due to: Intubated  Blood pressure 111/84, pulse 117, temperature 99.3 F (37.4 C), temperature source Oral, resp. rate 22, height 5' 7"  (1.702 m), weight 120.3 kg (265 lb 3.4 oz), SpO2 94 %. Physical Exam  Constitutional:  45 year old right-handed obese female    HENT:  Head: Normocephalic.  Eyes: EOM are normal. Pupils are equal, round, and reactive to light.  Neck: Normal range of motion. Neck supple. No JVD present. No tracheal deviation present. No thyromegaly present.  Cardiovascular: Regular rhythm.   tachycardic  Respiratory: No respiratory distress.  Decreased breath sounds at the bases  GI: Soft. Bowel sounds are normal. She exhibits no distension.  Neurological: She is alert.  Pt makes little eye contact. Uttering something about a "kid"---follows simple one step commands less than 25%. Would not answer any biographical information. Makes weak attempt to grasp with fingers and wiggle toes. Does not withdraw to pain provocation.   Skin: Skin is warm and dry.  Psychiatric:  Flat, appears depressed, very confused.     Results for orders placed or performed during the hospital encounter of 07/17/15 (from the past 24 hour(s))  Magnesium     Status: None   Collection Time: 07/29/15  3:30 AM  Result Value Ref Range   Magnesium 2.2 1.7 - 2.4 mg/dL  Phosphorus     Status: None   Collection Time: 07/29/15  3:30 AM  Result Value Ref Range   Phosphorus 4.3 2.5 - 4.6 mg/dL  Basic metabolic panel  Status: Abnormal   Collection Time: 07/29/15  3:30 AM  Result Value Ref Range   Sodium 137 135 - 145 mmol/L   Potassium 4.0 3.5 - 5.1 mmol/L   Chloride 100 (L) 101 - 111 mmol/L   CO2 26 22 - 32 mmol/L   Glucose, Bld 116 (H) 65 - 99 mg/dL   BUN 18 6 - 20 mg/dL   Creatinine, Ser 0.48 0.44 - 1.00 mg/dL   Calcium 9.7 8.9 - 10.3 mg/dL   GFR calc non Af Amer >60 >60 mL/min   GFR calc Af Amer >60 >60 mL/min   Anion gap 11 5 - 15  CBC     Status: Abnormal   Collection Time: 07/29/15  3:30 AM  Result Value Ref Range   WBC 15.6 (H) 4.0 - 10.5 K/uL   RBC 4.32 3.87 - 5.11 MIL/uL   Hemoglobin 12.8 12.0 - 15.0 g/dL   HCT 38.2 36.0 - 46.0 %   MCV 88.4 78.0 - 100.0 fL   MCH 29.6 26.0 - 34.0 pg   MCHC 33.5 30.0 - 36.0 g/dL   RDW 14.1 11.5 - 15.5 %    Platelets 455 (H) 150 - 400 K/uL   Dg Chest Port 1 View  07/28/2015  CLINICAL DATA:  Respiratory failure, pneumonia, sepsis, ARDS. EXAM: PORTABLE CHEST 1 VIEW COMPARISON:  Portable chest x-ray of July 27, 2015 FINDINGS: The lungs are slightly better inflated today. Aeration of the left upper lung has improved. There is persistent interstitial density at the right lung base. On the left the mildly increased airspace densities persists. There remains partial obscuration of the left hemidiaphragm. A left pleural effusion cannot be excluded. The cardiac silhouette is top-normal in size. The pulmonary vascularity is not engorged. The endotracheal tube tip lies 5.6 cm above the carina. The esophagogastric tube tip projects below the inferior margin of the image. IMPRESSION: Persistent bibasilar and left perihilar atelectasis or pneumonia. There may be a left pleural effusion layering posteriorly. There is no significant pulmonary edema. Electronically Signed   By: David  Martinique M.D.   On: 07/28/2015 08:20   Dg Abd Portable 1v  07/27/2015  CLINICAL DATA:  Encounter for orogastric tube placement. EXAM: PORTABLE ABDOMEN - 1 VIEW COMPARISON:  Abdominal radiograph 07/19/2015 FINDINGS: Tip and side port of the enteric tube below the diaphragm in the stomach, the tip is directed towards the fundus. No dilated bowel loops in the upper abdomen. Air seen within the colon. IMPRESSION: Tip and side port of the enteric tube below the diaphragm in the stomach, tip directed towards the fundus. Electronically Signed   By: Jeb Levering M.D.   On: 07/27/2015 19:22    Assessment/Plan: Diagnosis: encephalopathy, critical illness myopathy 1. Does the need for close, 24 hr/day medical supervision in concert with the patient's rehab needs make it unreasonable for this patient to be served in a less intensive setting? Yes 2. Co-Morbidities requiring supervision/potential complications: ARDS, dysphagia,  depression/anxiety 3. Due to bladder management, bowel management, safety, skin/wound care, disease management, medication administration, pain management and patient education, does the patient require 24 hr/day rehab nursing? Yes 4. Does the patient require coordinated care of a physician, rehab nurse, PT (1-2 hrs/day, 5 days/week), OT (1-2 hrs/day, 5 days/week) and SLP (1-2 hrs/day, 5 days/week) to address physical and functional deficits in the context of the above medical diagnosis(es)? Yes Addressing deficits in the following areas: balance, endurance, locomotion, strength, transferring, bowel/bladder control, bathing, dressing, feeding, grooming, toileting, cognition, speech, language,  swallowing and psychosocial support 5. Can the patient actively participate in an intensive therapy program of at least 3 hrs of therapy per day at least 5 days per week? Potentially 6. The potential for patient to make measurable gains while on inpatient rehab is good 7. Anticipated functional outcomes upon discharge from inpatient rehab are supervision and min assist  with PT, supervision, min assist and mod assist with OT, supervision and min assist with SLP. 8. Estimated rehab length of stay to reach the above functional goals is: 18-22 days 9. Does the patient have adequate social supports and living environment to accommodate these discharge functional goals? Yes and Potentially 10. Anticipated D/C setting: Home 11. Anticipated post D/C treatments: HH therapy and Outpatient therapy 12. Overall Rehab/Functional Prognosis: good  RECOMMENDATIONS: This patient's condition is appropriate for continued rehabilitative care in the following setting: CIR Patient has agreed to participate in recommended program. N/A Note that insurance prior authorization may be required for reimbursement for recommended care.  Comment: Will need 24 hr assistance from one of sisters upon dc to home. Rehab Admissions Coordinator to  follow up.  Thanks,  Meredith Staggers, MD, Mellody Drown     07/29/2015

## 2015-07-30 DIAGNOSIS — R410 Disorientation, unspecified: Secondary | ICD-10-CM

## 2015-07-30 LAB — BASIC METABOLIC PANEL
ANION GAP: 12 (ref 5–15)
BUN: 29 mg/dL — AB (ref 6–20)
CALCIUM: 9.7 mg/dL (ref 8.9–10.3)
CO2: 23 mmol/L (ref 22–32)
CREATININE: 0.64 mg/dL (ref 0.44–1.00)
Chloride: 104 mmol/L (ref 101–111)
GFR calc Af Amer: >60 mL/min (ref >60)
GLUCOSE: 110 mg/dL — AB (ref 65–99)
Potassium: 3 mmol/L — ABNORMAL LOW (ref 3.5–5.1)
Sodium: 139 mmol/L (ref 135–145)

## 2015-07-30 NOTE — Progress Notes (Signed)
PULMONARY / CRITICAL CARE MEDICINE   Name: Cynthia Murray MRN: [id number] DOB: 09/07/1970    ADMISSION DATE:  07/17/2015  REFERRING MD:  Lovie Macadamia EDP   CHIEF COMPLAINT:  Short of breath  STUDIES:  12/31 CT chest >> Lobar consolidation LUL with widespread infiltrates 12/31 CT head >> negative 1/4 CXR >> improved   CULTURES: 12/31 Blood ([place]) >> NEG 12/31 Pneumococcal Ag >> POSITIVE 12/31 Legionella Ag >> NEG 12/31 Sputum >> NEG 01/01 HIV >> NEG 01/06 Sputum >> NEG 01/05 Blood >> NEG 01/09 C. Diff. PCR >> NEGATIVE  ANTIBIOTICS: Vanc 12/31 >>1/2  Vanc 1/6 (fever) >> 1/8 Zosyn 12/31 >>1/2 Ceftriaxone 1/2 >>> 1/6 Ceftaz 1/6 (fever) >>> 1/9  SIGNIFICANT EVENTS: 12/31 Transfer from [place], [place], Sepsis 01/01 ARDS protocol 1/2 Prone required 1/3 Supine 1/9 All antibiotics discontinued 1/10 Patient removed her own Cortrak, replaced with OGT 1/11 Extubated  LINES/TUBES: PIV x3 ETT 12/31 - 1/11 LIJ CVL 12/31 - 1/6 R Radial A-line 1/1 - 1/6  SUBJECTIVE: Patient continuing to have significant confusion. She refuses to answer questions for me today or cannot do so. Patient tearful and does mumble some answers are incoherent.  REVIEW OF SYSTEMS: Unable to obtain given continued altered mental status and delirium.  VITAL SIGNS: BP 132/59 mmHg  Pulse 120  Temp(Src) 99 F (37.2 C) (Oral)  Resp 23  Ht 5' 7"  (1.702 m)  Wt 265 lb 3.4 oz (120.3 kg)  BMI 41.53 kg/m2  SpO2 91%  LMP  (LMP Unknown)  HEMODYNAMICS:    VENTILATOR SETTINGS:    INTAKE / OUTPUT: I/O last 3 completed shifts: In: 160 [I.V.:160] Out: 1245 [Urine:1245]  PHYSICAL EXAMINATION: Gen.: Patient sitting up in bed. No distress. Watching TV. Integument: Warm and dry. No rash on exposed skin. Cardiovascular: Regular rate. Normal S1 & S2. No edema. Pulmonary: Good aeration & clear to auscultation bilaterally. Normal work of breathing on room air. Neurological: Patient spontaneously moving  all 4 extremities. Appears tearful to a small degree. Patient refusing to answer questions but does attend to voice and track.   LABS:  BMET  Recent Labs Lab 07/28/15 0212 07/29/15 0330 07/30/15 0528  NA 137 137 139  K 4.7 4.0 3.0*  CL 97* 100* 104  CO2 28 26 23   BUN 16 18 29*  CREATININE 0.48 0.48 0.64  GLUCOSE 91 116* 110*    Electrolytes  Recent Labs Lab 07/27/15 0248 07/28/15 0212 07/29/15 0330 07/30/15 0528  CALCIUM 9.0 9.4 9.7 9.7  MG 1.7 2.0 2.2  --   PHOS 3.3 4.1 4.3  --     CBC  Recent Labs Lab 07/27/15 0248 07/28/15 0212 07/29/15 0330  WBC 17.6* 18.7* 15.6*  HGB 11.3* 12.3 12.8  HCT 36.3 38.1 38.2  PLT 259 323 455*     Liver Enzymes  Recent Labs Lab 07/24/15 0238  AST 24  ALT 26  ALKPHOS 109  BILITOT 0.5  ALBUMIN 2.3*     Glucose  Recent Labs Lab 07/26/15 1608 07/26/15 2030 07/27/15 0049 07/27/15 0438 07/27/15 0841 07/27/15 1122  GLUCAP 117* 133* 103* 107* 119* 112*    Imaging No results found.  ASSESSMENT / PLAN:  45 y/o female with septic shock, ARDS from pneumococcal lobar pneumonia. Patient's respiratory status remains stable post extubation. Her altered mentation and delirium persist.  1. Acute Hypoxic Respiratory Failure:  Secondary to ARDS from Very Severe CAP & pulmonary edema. Continuing pulmonary toilet. Continuing Lasix by mouth twice a day. 2. Septic Shock:  Shock has resolved. Secondary to Very Severe CAP. Completed course of antibiotics. 3. Acute Encephalopathy/Delirium: Persisting and seemingly largely unchanged. Patient restarted on home psychiatry medications. 4. Severe  Community Acquired Pneumonia:  Completed course of antibiotics. 5. H/O Depression/Anxiety:  Continuing Buspar & Prozac. 6. Deconditioning: Continuing PT. 7. Normocytic Anemia: No evidence for active bleeding. 8. Hypertension: Continuing Lopressor twice a day. 9. H/O Headaches & Chronic Back Pain: Holding pain medications given altered  mentation. 10. Prophylaxis:  Heparin [place] q8hr. 11. Diet: Recommended for a dysphagia 3 diet with nectar thickened.  TRH to assume care & PCCM signing off 1/14.  Sonia Baller Ashok Cordia, M.D. [facility] Pulmonary & Critical Care Pager:  [phone] After 3pm or if no response, call [phone] 07/30/2015, 7:22 AM

## 2015-07-30 NOTE — Progress Notes (Signed)
Rehab admissions - I am following along for possible inpatient rehab admission once patient is medically ready.  Noted HR 147, not following commands.  I will check back on Monday for progress and medical readiness.  Call me for questions.  #[phone]

## 2015-07-30 NOTE — Progress Notes (Addendum)
Physical Therapy Treatment Patient Details Name: Cynthia Murray MRN: [id number] DOB: 1971/05/03 Today's Date: 07/30/2015    History of Present Illness pt is a 45 y/o female with h/o HTN admitted from [facility] with cough fever, chills, SOB, pleuritic pain, HA.  CXR showed LLL PNA, ETT placed on arrival, 1/1   ARDS protocol initiated.     PT Comments    Cynthia Murray was oriented to self only and was not following commands.  Additionally, HR up to 147 while sitting EOB, limiting mobility to bed this session.  Mod+2>Max+2 assist for bed mobility.  Pt will benefit from continued skilled PT services to increase functional independence and safety.   Follow Up Recommendations  CIR     Equipment Recommendations  Other (comment) (TBD)    Recommendations for Other Services Rehab consult;OT consult     Precautions / Restrictions Precautions Precautions: Fall Precaution Comments: watch HR Restrictions Weight Bearing Restrictions: No    Mobility  Bed Mobility Overal bed mobility: Needs Assistance;+2 for physical assistance Bed Mobility: Supine to Sit;Sit to Supine     Supine to sit: HOB elevated;Mod assist;+2 for physical assistance Sit to supine: Max assist;+2 for physical assistance   General bed mobility comments: HHA to pull up while supporting trunk and using bed pad to scoot to EOB. Managing Bil LEs and trunk to return to supine, again w/ use of bed pad for positioning.  Transfers                 General transfer comment: not attempted for pt/therapist safety and HR up to 147 sitting EOB  Ambulation/Gait                 Stairs            Wheelchair Mobility    Modified Rankin (Stroke Patients Only)       Balance Overall balance assessment: Needs assistance Sitting-balance support: Feet supported;Bilateral upper extremity supported Sitting balance-Leahy Scale: Fair Sitting balance - Comments: Sat EOB ~15 minutes w/ min guard assist, attempted to  engage pt in simple ADLs but pt unable to participate.                              Cognition Arousal/Alertness: Awake/alert Behavior During Therapy: Anxious Overall Cognitive Status: No family/caregiver present to determine baseline cognitive functioning Area of Impairment: Problem solving;Following commands;Orientation;Attention;Safety/judgement;Awareness Orientation Level: Disoriented to;Place;Time;Situation Current Attention Level: Focused   Following Commands:  (Pt does not follow any commands) Safety/Judgement: Decreased awareness of safety;Decreased awareness of deficits Awareness: Intellectual Problem Solving: Slow processing;Decreased initiation;Requires verbal cues;Difficulty sequencing;Requires tactile cues General Comments: When asked to do something she replies, "well, ya know" and does not follow any commands.  Pt unable to wash face of donn/doff her glasses despite verbal and tactile cues.  Pt very emotionally labile, switching back and forth from crying to laughing.      Exercises      General Comments General comments (skin integrity, edema, etc.): HR up to 147 while sitting EOB      Pertinent Vitals/Pain Pain Assessment: Faces Faces Pain Scale: Hurts little more Pain Location: pt unable to indicate Pain Descriptors / Indicators: Grimacing Pain Intervention(s): Limited activity within patient's tolerance;Monitored during session    Home Living                      Prior Function  PT Goals (current goals can now be found in the care plan section) Acute Rehab PT Goals Patient Stated Goal: none stated PT Goal Formulation: Patient unable to participate in goal setting Time For Goal Achievement: 08/09/15 Potential to Achieve Goals: Good Progress towards PT goals: Progressing toward goals (very modestly)    Frequency  Min 3X/week    PT Plan Current plan remains appropriate    Co-evaluation             End of Session    Activity Tolerance: Patient limited by fatigue Patient left: in bed;with call bell/phone within reach;with bed alarm set     Time: 0936-1000 PT Time Calculation (min) (ACUTE ONLY): 24 min  Charges:  $Therapeutic Activity: 23-37 mins                    G CodesJoslyn Murray PT, DPT [phone] Pager: [phone] 07/30/2015, 11:19 AM

## 2015-07-31 ENCOUNTER — Inpatient Hospital Stay (HOSPITAL_COMMUNITY): Payer: Self-pay

## 2015-07-31 DIAGNOSIS — A419 Sepsis, unspecified organism: Secondary | ICD-10-CM | POA: Diagnosis present

## 2015-07-31 DIAGNOSIS — I272 Other secondary pulmonary hypertension: Secondary | ICD-10-CM

## 2015-07-31 DIAGNOSIS — R6521 Severe sepsis with septic shock: Secondary | ICD-10-CM

## 2015-07-31 DIAGNOSIS — J189 Pneumonia, unspecified organism: Secondary | ICD-10-CM | POA: Diagnosis present

## 2015-07-31 DIAGNOSIS — G894 Chronic pain syndrome: Secondary | ICD-10-CM

## 2015-07-31 LAB — CBC WITH DIFFERENTIAL/PLATELET
BASOS PCT: 0 %
Basophils Absolute: 0 10*3/uL (ref 0.0–0.1)
Eosinophils Absolute: 0 10*3/uL (ref 0.0–0.7)
Eosinophils Relative: 0 %
HEMATOCRIT: 39.2 % (ref 36.0–46.0)
HEMOGLOBIN: 13.2 g/dL (ref 12.0–15.0)
LYMPHS PCT: 22 %
Lymphs Abs: 2.9 10*3/uL (ref 0.7–4.0)
MCH: 30.3 pg (ref 26.0–34.0)
MCHC: 33.7 g/dL (ref 30.0–36.0)
MCV: 89.9 fL (ref 78.0–100.0)
MONOS PCT: 8 %
Monocytes Absolute: 1.1 10*3/uL — ABNORMAL HIGH (ref 0.1–1.0)
NEUTROS ABS: 9.3 10*3/uL — AB (ref 1.7–7.7)
Neutrophils Relative %: 70 %
Platelets: 529 10*3/uL — ABNORMAL HIGH (ref 150–400)
RBC: 4.36 MIL/uL (ref 3.87–5.11)
RDW: 14.7 % (ref 11.5–15.5)
WBC: 13.3 10*3/uL — ABNORMAL HIGH (ref 4.0–10.5)

## 2015-07-31 LAB — COMPREHENSIVE METABOLIC PANEL
ALBUMIN: 3.8 g/dL (ref 3.5–5.0)
ALK PHOS: 74 U/L (ref 38–126)
ALT: 36 U/L (ref 14–54)
AST: 18 U/L (ref 15–41)
Anion gap: 13 (ref 5–15)
BUN: 28 mg/dL — AB (ref 6–20)
CO2: 22 mmol/L (ref 22–32)
Calcium: 9.9 mg/dL (ref 8.9–10.3)
Chloride: 109 mmol/L (ref 101–111)
Creatinine, Ser: 0.62 mg/dL (ref 0.44–1.00)
GFR calc Af Amer: >60 mL/min (ref >60)
GFR calc non Af Amer: >60 mL/min (ref >60)
GLUCOSE: 146 mg/dL — AB (ref 65–99)
POTASSIUM: 3 mmol/L — AB (ref 3.5–5.1)
SODIUM: 144 mmol/L (ref 135–145)
Total Bilirubin: 0.9 mg/dL (ref 0.3–1.2)
Total Protein: 8.1 g/dL (ref 6.5–8.1)

## 2015-07-31 LAB — MAGNESIUM: Magnesium: 2.2 mg/dL (ref 1.7–2.4)

## 2015-07-31 MED ORDER — POTASSIUM CHLORIDE 20 MEQ/15ML (10%) PO SOLN: 50.0000 meq | Freq: Every day | ORAL | Status: DC

## 2015-07-31 MED ORDER — POTASSIUM CHLORIDE CRYS ER 20 MEQ PO TBCR
40.0000 meq | EXTENDED_RELEASE_TABLET | Freq: Every day | ORAL | Status: DC
  Administered 2015-07-31 – 2015-08-05 (×6): 40 meq via ORAL
  Filled 2015-07-31 (×6): qty 2

## 2015-07-31 NOTE — Progress Notes (Signed)
[place] TEAM 1 - Stepdown/ICU TEAM Progress Note  Cynthia Murray NTZ:[id number] DOB: 05/16/1971 DOA: 07/17/2015 PCP: Marcial Pacas, DO  Admit HPI / Brief Narrative: 45 yo WF PMHx Tobacco abuse (current smoker),  Refered to [facility] from [facility] ER . She presented with 5 day hx of cough, sob , weakness , pleuritic chest pain, headache and chills. She was found to be hypotensive and hypoxic. CXR showed LLL PNA. She was started on Levophed and IV Abx /Zithromax . ABG showed pH7.39/PCO2 27 /Po2 at 56. Pt complains that her head is hurting bad. She feels sick all over . Has chronic back pain that is hurting worse. Lactate was elevated at 4.1 BNP elevated ~2700. Troponin neg. WBC elevated~17K . Pt says she has had pink tinged mucus mixed with green for last 5 days.  Pt is a heavy smoker . 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 She was treated with abx with clinical improvement . According to office notes review she was recommended to have repeat CT chest but declined due to financial reasons. She denies any vomiting or diarrhea.  No abdominal pain, urinary symptoms.   HPI/Subjective: 1/14 overnight MAXIMUM TEMPERATURE = 38.2 C, A/O 0, patient's eyes are open attempts to respond to questions but speaks nonsensically. Follow some commands  Assessment/Plan: Acute Hypoxic Respiratory Failure:  -Secondary to ARDS from Very Severe CAP & pulmonary edema.  -Continuing pulmonary toilet. Continuing Lasix by mouth twice a day.  Septic Shock/severe CAP:  -Shock has resolved.  -Completed course of antibiotics. -1/14 patient continues to have leukocytosis with fevers despite completing course of antibiotics. -Obtain blood culture, urine culture, PCXR  Leukocytosis -See septic shock  Acute Encephalopathy/Delirium:  -Persisting and seemingly largely unchanged.  -Patient restarted on home psychiatry medications -Obtain brain MRI -Obtain EEG  H/O Depression/Anxiety:  -Continuing  Buspar 7.5 mg  BID -Continue Prozac. 40 mg daily  Deconditioning:  -Patient unable to cooperate with PT in order to complete evaluation .  Hypertension:  -BP soft medication on hold.  Pulmonary hypertension  Chronic pain syndrome -H/O Headaches & Chronic Back Pain:  -Patient not complaining of pain and given her poor cognition would hold all mind altering/sedating medication if possible   Hypokalemia -Potassium 40 mEq  Nutrition  -Diet: Recommended for a dysphagia 3 diet with nectar thickened.    Code Status: FULL Family Communication: no family present at time of exam Disposition Plan: Resolution acute mental status change    Consultants: Dr.Jennings E Nestor PCCM    Procedure/Significant Events: 12/31 Transfer from [place], [place], Sepsis 12/31 CT head without contrast; negative acute findings 12/31 CT chest wo contrast;Lobar pneumonia of the left upper lobe with developing bronchopneumonia throughout the remaining portions of the lungs, presumably from endobronchial spread of infection. 01/01 ARDS protocol 1/2 echocardiogram;Left ventricle: Systolic function WNL - (grade 1 diastolicdysfunction). - Pulmonary arteries: PA peak pressure: 47 mm Hg (S).1/3 Supine 1/10 Patient removed her own Cortrak, replaced with OGT 1/11 Extubated       Culture 12/31 Blood ([facility]) >> NEG 12/31 Pneumococcal Ag >> POSITIVE 12/31 Legionella Ag >> NEG 12/31 Sputum >> NEG 01/01 HIV >> NEG 01/06 Sputum >> NEG 01/05 Blood >> NEG 01/09 C. Diff. PCR >> NEGATIVE   Antibiotics: Vanc 12/31 >>1/2  Vanc 1/6 (fever) >> 1/8 Zosyn 12/31 >>1/2 Ceftriaxone 1/2 >>> 1/6 Ceftaz 1/6 (fever) >>> 1/9   DVT prophylaxis: Subcutaneous heparin   Devices    LINES / TUBES:  Continuous Infusions:   Objective: VITAL SIGNS: Temp: 98.8 F (37.1 C) (01/14 1500) Temp Source: Axillary (01/14 1500) BP: 122/73 mmHg (01/14 1100) Pulse Rate: 119 (01/14  0700) SPO2; FIO2:   Intake/Output Summary (Last 24 hours) at 07/31/15 1749 Last data filed at 07/31/15 1637  Gross per 24 hour  Intake      0 ml  Output    550 ml  Net   -550 ml     Exam: General:A/O 0, patient's eyes are open attempts to respond to questions but speaks nonsensically. Follow some commands, No acute respiratory distress Eyes: negative scleral hemorrhage ENT: Negative Runny nose, negative gingival bleeding, Neck:  Negative scars, masses, torticollis, lymphadenopathy, JVD Lungs: decreased breath sounds diffusely left lung fields, right lung field clear to auscultation, negative wheezes, negative crackles Cardiovascular: Regular rate and rhythm without murmur gallop or rub normal S1 and S2 Abdomen: Morbidly obese negative abdominal pain, nondistended, positive soft, bowel sounds, no rebound, no ascites, no appreciable mass Extremities: No significant cyanosis, clubbing, or edema bilateral lower extremities Psychiatric:  Negative depression, negative anxiety, negative fatigue, negative mania Neurologic:  Cranial nerves II through XII intact, tongue/uvula midline, all extremities muscle strength 5/5, sensation intact throughout, unable to perform finger nose finger bilateral, unable to perform quick finger touch,  Positive dysarthria, positive expressive aphasia, positive receptive aphasia.   Data Reviewed: Basic Metabolic Panel:  Recent Labs Lab 07/25/15 0615  07/26/15 0202 07/27/15 0248 07/28/15 0212 07/29/15 0330 07/30/15 0528 07/31/15 1045  NA 141  --  141 137 137 137 139 144  K 2.8*  --  4.3 4.3 4.7 4.0 3.0* 3.0*  CL 99*  --  100* 100* 97* 100* 104 109  CO2 32  --  31 29 28 26 23 22   GLUCOSE 105*  --  118* 110* 91 116* 110* 146*  BUN 17  --  16 18 16 18  29* 28*  CREATININE 0.36*  --  0.39* 0.43* 0.48 0.48 0.64 0.62  CALCIUM 8.7*  --  8.6* 9.0 9.4 9.7 9.7 9.9  MG 1.7  < > 1.9 1.7 2.0 2.2  --  2.2  PHOS 3.4  --  3.0 3.3 4.1 4.3  --   --   < > = values  in this interval not displayed. Liver Function Tests:  Recent Labs Lab 07/31/15 1045  AST 18  ALT 36  ALKPHOS 74  BILITOT 0.9  PROT 8.1  ALBUMIN 3.8   No results for input(s): LIPASE, AMYLASE in the last 168 hours. No results for input(s): AMMONIA in the last 168 hours. CBC:  Recent Labs Lab 07/25/15 0615 07/27/15 0248 07/28/15 0212 07/29/15 0330 07/31/15 1045  WBC 13.8* 17.6* 18.7* 15.6* 13.3*  NEUTROABS  --   --  14.0*  --  9.3*  HGB 11.4* 11.3* 12.3 12.8 13.2  HCT 35.0* 36.3 38.1 38.2 39.2  MCV 91.9 93.6 91.1 88.4 89.9  PLT 234 259 323 455* 529*   Cardiac Enzymes:  Recent Labs Lab 07/25/15 1510  CKTOTAL 24*   BNP (last 3 results) No results for input(s): BNP in the last 8760 hours.  ProBNP (last 3 results) No results for input(s): PROBNP in the last 8760 hours.  CBG:  Recent Labs Lab 07/26/15 2030 07/27/15 0049 07/27/15 0438 07/27/15 0841 07/27/15 1122  GLUCAP 133* 103* 107* 119* 112*    Recent Results (from the past 240 hour(s))  Culture, blood (Routine X 2) w Reflex to ID Panel     Status: None  Collection Time: 07/22/15 11:10 AM  Result Value Ref Range Status   Specimen Description BLOOD RIGHT HAND  Final   Special Requests BOTTLES DRAWN AEROBIC ONLY 5CC  Final   Culture NO GROWTH 5 DAYS  Final   Report Status 07/27/2015 FINAL  Final  Culture, blood (Routine X 2) w Reflex to ID Panel     Status: None   Collection Time: 07/22/15 11:18 AM  Result Value Ref Range Status   Specimen Description BLOOD LEFT HAND  Final   Special Requests BOTTLES DRAWN AEROBIC ONLY 5CC  Final   Culture NO GROWTH 5 DAYS  Final   Report Status 07/27/2015 FINAL  Final  Culture, respiratory (NON-Expectorated)     Status: None   Collection Time: 07/23/15 10:25 AM  Result Value Ref Range Status   Specimen Description TRACHEAL ASPIRATE  Final   Special Requests NONE  Final   Gram Stain   Final    MODERATE WBC PRESENT,BOTH PMN AND MONONUCLEAR NO SQUAMOUS EPITHELIAL  CELLS SEEN FEW YEAST Performed at [facility]    Culture   Final    FEW YEAST CONSISTENT WITH CANDIDA SPECIES Performed at [facility]    Report Status 07/25/2015 FINAL  Final  C difficile quick scan w PCR reflex     Status: None   Collection Time: 07/26/15  1:29 PM  Result Value Ref Range Status   C Diff antigen NEGATIVE NEGATIVE Final   C Diff toxin NEGATIVE NEGATIVE Final   C Diff interpretation Negative for toxigenic C. difficile  Final     Studies:  Recent x-ray studies have been reviewed in detail by the Attending Physician  Scheduled Meds:  Scheduled Meds: . busPIRone  7.5 mg Oral BID  . famotidine  20 mg Oral BID  . feeding supplement (GLUCERNA SHAKE)  237 mL Oral BID BM  . FLUoxetine  40 mg Oral Daily  . heparin subcutaneous  5,000 Units Subcutaneous 3 times per day  . metoprolol tartrate  25 mg Oral BID  . potassium chloride  40 mEq Oral Daily    Time spent on care of this patient: 40 mins   WOODS, Geraldo Docker , MD  [facility] Office  [phone] Pager - [phone]  On-Call/Text Page:      [email or web address]      password [email or web address]  If 7PM-7AM, please contact night-coverage [email or web address] Password [email or web address] 07/31/2015, 5:49 PM   LOS: 14 days   Care during the described time interval was provided by me .  I have reviewed this patient's available data, including medical history, events of note, physical examination, and all test results as part of my evaluation. I have personally reviewed and interpreted all radiology studies.   Dia Crawford, MD [phone] Pager

## 2015-08-01 DIAGNOSIS — R410 Disorientation, unspecified: Secondary | ICD-10-CM | POA: Diagnosis present

## 2015-08-01 DIAGNOSIS — F329 Major depressive disorder, single episode, unspecified: Secondary | ICD-10-CM

## 2015-08-01 DIAGNOSIS — F411 Generalized anxiety disorder: Secondary | ICD-10-CM | POA: Diagnosis present

## 2015-08-01 DIAGNOSIS — E876 Hypokalemia: Secondary | ICD-10-CM

## 2015-08-01 DIAGNOSIS — G9340 Encephalopathy, unspecified: Secondary | ICD-10-CM | POA: Diagnosis present

## 2015-08-01 DIAGNOSIS — I10 Essential (primary) hypertension: Secondary | ICD-10-CM

## 2015-08-01 LAB — COMPREHENSIVE METABOLIC PANEL
ALK PHOS: 66 U/L (ref 38–126)
ALT: 32 U/L (ref 14–54)
AST: 16 U/L (ref 15–41)
Albumin: 3.6 g/dL (ref 3.5–5.0)
Anion gap: 15 (ref 5–15)
BILIRUBIN TOTAL: 0.6 mg/dL (ref 0.3–1.2)
BUN: 29 mg/dL — AB (ref 6–20)
CALCIUM: 10.2 mg/dL (ref 8.9–10.3)
CO2: 22 mmol/L (ref 22–32)
CREATININE: 0.62 mg/dL (ref 0.44–1.00)
Chloride: 111 mmol/L (ref 101–111)
Glucose, Bld: 123 mg/dL — ABNORMAL HIGH (ref 65–99)
Potassium: 3.3 mmol/L — ABNORMAL LOW (ref 3.5–5.1)
Sodium: 148 mmol/L — ABNORMAL HIGH (ref 135–145)
TOTAL PROTEIN: 7.7 g/dL (ref 6.5–8.1)

## 2015-08-01 LAB — CBC WITH DIFFERENTIAL/PLATELET
BASOS ABS: 0 10*3/uL (ref 0.0–0.1)
BASOS PCT: 0 %
EOS ABS: 0 10*3/uL (ref 0.0–0.7)
EOS PCT: 0 %
HCT: 40.1 % (ref 36.0–46.0)
Hemoglobin: 13.2 g/dL (ref 12.0–15.0)
LYMPHS PCT: 25 %
Lymphs Abs: 3.3 10*3/uL (ref 0.7–4.0)
MCH: 30.1 pg (ref 26.0–34.0)
MCHC: 32.9 g/dL (ref 30.0–36.0)
MCV: 91.6 fL (ref 78.0–100.0)
MONO ABS: 1.1 10*3/uL — AB (ref 0.1–1.0)
Monocytes Relative: 8 %
Neutro Abs: 9 10*3/uL — ABNORMAL HIGH (ref 1.7–7.7)
Neutrophils Relative %: 67 %
PLATELETS: 535 10*3/uL — AB (ref 150–400)
RBC: 4.38 MIL/uL (ref 3.87–5.11)
RDW: 14.6 % (ref 11.5–15.5)
WBC: 13.5 10*3/uL — AB (ref 4.0–10.5)

## 2015-08-01 LAB — MAGNESIUM: MAGNESIUM: 2.3 mg/dL (ref 1.7–2.4)

## 2015-08-01 MED ORDER — POTASSIUM CHLORIDE CRYS ER 20 MEQ PO TBCR
20.0000 meq | EXTENDED_RELEASE_TABLET | Freq: Once | ORAL | Status: AC
  Administered 2015-08-01: 20 meq via ORAL
  Filled 2015-08-01: qty 1

## 2015-08-01 MED ORDER — POTASSIUM CHLORIDE CRYS ER 20 MEQ PO TBCR: 40.0000 meq | EXTENDED_RELEASE_TABLET | Freq: Once | ORAL | Status: DC

## 2015-08-01 NOTE — Progress Notes (Addendum)
[place] TEAM 1 - Stepdown/ICU TEAM Progress Note  Akela Pocius ULA:[id number] DOB: [birth year] DOA: 07/17/2015 PCP: Marcial Pacas, DO  Admit HPI / Brief Narrative: 45 yo WF PMHx Tobacco abuse (current smoker),  Refered to [facility] from [facility] ER . She presented with 5 day hx of cough, sob , weakness , pleuritic chest pain, headache and chills. She was found to be hypotensive and hypoxic. CXR showed LLL PNA. She was started on Levophed and IV Abx /Zithromax . ABG showed pH7.39/PCO2 27 /Po2 at 56. Pt complains that her head is hurting bad. She feels sick all over . Has chronic back pain that is hurting worse. Lactate was elevated at 4.1 BNP elevated ~2700. Troponin neg. WBC elevated~17K . Pt says she has had pink tinged mucus mixed with green for last 5 days.  Pt is a heavy smoker . 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 She was treated with abx with clinical improvement . According to office notes review she was recommended to have repeat CT chest but declined due to financial reasons. She denies any vomiting or diarrhea.  No abdominal pain, urinary symptoms.   HPI/Subjective: 1/15 overnight afebrile, A/O  2 (does not know when, why), attempts to answer all questions appropriately. Perseveration. Follows commands. States that all she wanted to do was spend a weekend with her daughter (unsure patient has daughter)  Assessment/Plan: Acute Hypoxic Respiratory Failure:  -Secondary to ARDS from Very Severe CAP & pulmonary edema.  -Continuing pulmonary toilet. Continuing Lasix by mouth twice a day.  Septic Shock/severe CAP:  -Shock has resolved.  -Completed course of antibiotics. -1/14 patient continues to have leukocytosis with fevers despite completing course of antibiotics. -blood culture, urine culture, PCXR; nondiagnostic  Leukocytosis -See septic shock  Acute Encephalopathy/Delirium/psychogenic?:  -Persisting and seemingly largely unchanged.  -Patient restarted  on home psychiatry medications -Brain MRI; nondiagnostic -EEG pending -TSH pending  H/O Depression/Anxiety:  -Continuing Buspar 7.5 mg  BID -Continue Prozac. 40 mg daily  Deconditioning:  -Patient unable to cooperate with PT in order to complete evaluation .  Hypertension:  -BP soft medication on hold.  Pulmonary hypertension  Chronic pain syndrome -H/O Headaches & Chronic Back Pain:  -Patient not complaining of pain and given her poor cognition would hold all mind altering/sedating medication if possible   HypokalemiaNutrition  -Potassium goal> 4 -Continue potassium 40 mEq daily  -Potassium 20 mEq 1   Diet:  -Recommended for a dysphagia 3 diet with nectar thickened.    Code Status: FULL Family Communication: no family present at time of exam Disposition Plan: Resolution acute mental status change    Consultants: Dr.Jennings E Nestor PCCM    Procedure/Significant Events: 12/31 Transfer from [place], [place], Sepsis 12/31 CT head without contrast; negative acute findings 12/31 CT chest wo contrast;Lobar pneumonia of the left upper lobe with developing bronchopneumonia throughout the remaining portions of the lungs, presumably from endobronchial spread of infection. 01/01 ARDS protocol 1/2 echocardiogram;Left ventricle: Systolic function WNL - (grade 1 diastolicdysfunction). - Pulmonary arteries: PA peak pressure: 47 mm Hg (S).1/3 Supine 1/10 Patient removed her own Cortrak, replaced with OGT 1/11 Extubated 1/14 MRI brain;-Premature for age atrophy. - likely chronic microvascular ischemic change.      Culture 12/31 Blood ([facility]) >> NEG 12/31 Pneumococcal Ag >> POSITIVE 12/31 Legionella Ag >> NEG 12/31 Sputum >> NEG 01/01 HIV >> NEG 01/06 Sputum >> NEG 01/05 Blood >> NEG 01/09 C. Diff. PCR >> NEGATIVE 1/14 urine NGTD 1/14  blood right AC/hand NGTD   Antibiotics: Vanc 12/31 >>1/2  Vanc 1/6 (fever) >> 1/8 Zosyn 12/31 >>1/2 Ceftriaxone 1/2 >>>  1/6 Ceftaz 1/6 (fever) >>> 1/9   DVT prophylaxis: Subcutaneous heparin   Devices    LINES / TUBES:      Continuous Infusions:   Objective: VITAL SIGNS: Temp: 97.9 F (36.6 C) (01/15 2016) Temp Source: Oral (01/15 2016) BP: 122/77 mmHg (01/15 2000) Pulse Rate: 124 (01/15 2000) SPO2; FIO2:   Intake/Output Summary (Last 24 hours) at 08/01/15 2040 Last data filed at 08/01/15 1105  Gross per 24 hour  Intake      0 ml  Output    700 ml  Net   -700 ml     Exam: General: A/O  2 (does not know when, why), attempts to answer all questions appropriately. No acute respiratory distress Eyes: negative scleral hemorrhage ENT: Negative Runny nose, negative gingival bleeding, Neck:  Negative scars, masses, torticollis, lymphadenopathy, JVD Lungs: decreased breath sounds diffusely left lung fields, right lung field clear to auscultation, negative wheezes, negative crackles Cardiovascular: Regular rate and rhythm without murmur gallop or rub normal S1 and S2 Abdomen: Morbidly obese negative abdominal pain, nondistended, positive soft, bowel sounds, no rebound, no ascites, no appreciable mass Extremities: No significant cyanosis, clubbing, or edema bilateral lower extremities Psychiatric:  Negative depression, negative anxiety, negative fatigue, negative mania Neurologic:  Cranial nerves II through XII intact, tongue/uvula midline, all extremities muscle strength 5/5, sensation intact throughout, performs finger nose finger bilateral (very difficult for her to accomplish), unable to perform quick finger touch, positive intention tremor, Positive dysarthria, positive expressive aphasia, positive receptive aphasia.   Data Reviewed: Basic Metabolic Panel:  Recent Labs Lab 07/26/15 0202 07/27/15 0248 07/28/15 0212 07/29/15 0330 07/30/15 0528 07/31/15 1045 08/01/15 0425  NA 141 137 137 137 139 144 148*  K 4.3 4.3 4.7 4.0 3.0* 3.0* 3.3*  CL 100* 100* 97* 100* 104 109 111  CO2  31 29 28 26 23 22 22   GLUCOSE 118* 110* 91 116* 110* 146* 123*  BUN 16 18 16 18  29* 28* 29*  CREATININE 0.39* 0.43* 0.48 0.48 0.64 0.62 0.62  CALCIUM 8.6* 9.0 9.4 9.7 9.7 9.9 10.2  MG 1.9 1.7 2.0 2.2  --  2.2 2.3  PHOS 3.0 3.3 4.1 4.3  --   --   --    Liver Function Tests:  Recent Labs Lab 07/31/15 1045 08/01/15 0425  AST 18 16  ALT 36 32  ALKPHOS 74 66  BILITOT 0.9 0.6  PROT 8.1 7.7  ALBUMIN 3.8 3.6   No results for input(s): LIPASE, AMYLASE in the last 168 hours. No results for input(s): AMMONIA in the last 168 hours. CBC:  Recent Labs Lab 07/27/15 0248 07/28/15 0212 07/29/15 0330 07/31/15 1045 08/01/15 0425  WBC 17.6* 18.7* 15.6* 13.3* 13.5*  NEUTROABS  --  14.0*  --  9.3* 9.0*  HGB 11.3* 12.3 12.8 13.2 13.2  HCT 36.3 38.1 38.2 39.2 40.1  MCV 93.6 91.1 88.4 89.9 91.6  PLT 259 323 455* 529* 535*   Cardiac Enzymes: No results for input(s): CKTOTAL, CKMB, CKMBINDEX, TROPONINI in the last 168 hours. BNP (last 3 results) No results for input(s): BNP in the last 8760 hours.  ProBNP (last 3 results) No results for input(s): PROBNP in the last 8760 hours.  CBG:  Recent Labs Lab 07/26/15 2030 07/27/15 0049 07/27/15 0438 07/27/15 0841 07/27/15 1122  GLUCAP 133* 103* 107* 119* 112*    Recent  Results (from the past 240 hour(s))  Culture, respiratory (NON-Expectorated)     Status: None   Collection Time: 07/23/15 10:25 AM  Result Value Ref Range Status   Specimen Description TRACHEAL ASPIRATE  Final   Special Requests NONE  Final   Gram Stain   Final    MODERATE WBC PRESENT,BOTH PMN AND MONONUCLEAR NO SQUAMOUS EPITHELIAL CELLS SEEN FEW YEAST Performed at [facility]    Culture   Final    FEW YEAST CONSISTENT WITH CANDIDA SPECIES Performed at [facility]    Report Status 07/25/2015 FINAL  Final  C difficile quick scan w PCR reflex     Status: None   Collection Time: 07/26/15  1:29 PM  Result Value Ref Range Status   C Diff antigen  NEGATIVE NEGATIVE Final   C Diff toxin NEGATIVE NEGATIVE Final   C Diff interpretation Negative for toxigenic C. difficile  Final  Culture, Urine     Status: None (Preliminary result)   Collection Time: 07/31/15  4:45 PM  Result Value Ref Range Status   Specimen Description URINE, CATHETERIZED  Final   Special Requests NONE  Final   Culture NO GROWTH < 24 HOURS  Final   Report Status PENDING  Incomplete  Culture, blood (routine x 2)     Status: None (Preliminary result)   Collection Time: 07/31/15  6:40 PM  Result Value Ref Range Status   Specimen Description BLOOD RIGHT ANTECUBITAL  Final   Special Requests IN PEDIATRIC BOTTLE 1CC  Final   Culture NO GROWTH < 24 HOURS  Final   Report Status PENDING  Incomplete  Culture, blood (routine x 2)     Status: None (Preliminary result)   Collection Time: 07/31/15  6:45 PM  Result Value Ref Range Status   Specimen Description BLOOD RIGHT HAND  Final   Special Requests IN PEDIATRIC BOTTLE 1CC  Final   Culture NO GROWTH < 24 HOURS  Final   Report Status PENDING  Incomplete     Studies:  Recent x-ray studies have been reviewed in detail by the Attending Physician  Scheduled Meds:  Scheduled Meds: . busPIRone  7.5 mg Oral BID  . famotidine  20 mg Oral BID  . feeding supplement (GLUCERNA SHAKE)  237 mL Oral BID BM  . FLUoxetine  40 mg Oral Daily  . heparin subcutaneous  5,000 Units Subcutaneous 3 times per day  . metoprolol tartrate  25 mg Oral BID  . potassium chloride  40 mEq Oral Daily    Time spent on care of this patient: 40 mins   Luz Mares, Geraldo Docker , MD  [facility] Office  [phone] Pager - [phone]  On-Call/Text Page:      [email or web address]      password [email or web address]  If 7PM-7AM, please contact night-coverage [email or web address] Password [email or web address] 08/01/2015, 8:40 PM   LOS: 15 days   Care during the described time interval was provided by me .  I have reviewed this patient's available data, including medical history, events of  note, physical examination, and all test results as part of my evaluation. I have personally reviewed and interpreted all radiology studies.   Dia Crawford, MD [phone] Pager

## 2015-08-02 ENCOUNTER — Inpatient Hospital Stay (HOSPITAL_COMMUNITY): Payer: MEDICAID

## 2015-08-02 LAB — COMPREHENSIVE METABOLIC PANEL
ALK PHOS: 61 U/L (ref 38–126)
ALT: 28 U/L (ref 14–54)
AST: 18 U/L (ref 15–41)
Albumin: 4 g/dL (ref 3.5–5.0)
Anion gap: 12 (ref 5–15)
BILIRUBIN TOTAL: 0.7 mg/dL (ref 0.3–1.2)
BUN: 28 mg/dL — ABNORMAL HIGH (ref 6–20)
CALCIUM: 10.1 mg/dL (ref 8.9–10.3)
CO2: 21 mmol/L — AB (ref 22–32)
CREATININE: 0.7 mg/dL (ref 0.44–1.00)
Chloride: 119 mmol/L — ABNORMAL HIGH (ref 101–111)
GFR calc non Af Amer: >60 mL/min (ref >60)
GLUCOSE: 116 mg/dL — AB (ref 65–99)
Potassium: 4.1 mmol/L (ref 3.5–5.1)
SODIUM: 152 mmol/L — AB (ref 135–145)
TOTAL PROTEIN: 8.3 g/dL — AB (ref 6.5–8.1)

## 2015-08-02 LAB — CBC WITH DIFFERENTIAL/PLATELET
BASOS PCT: 1 %
Basophils Absolute: 0.1 10*3/uL (ref 0.0–0.1)
EOS ABS: 0 10*3/uL (ref 0.0–0.7)
EOS PCT: 0 %
HCT: 42.3 % (ref 36.0–46.0)
Hemoglobin: 13.7 g/dL (ref 12.0–15.0)
LYMPHS ABS: 4.6 10*3/uL — AB (ref 0.7–4.0)
Lymphocytes Relative: 35 %
MCH: 30.2 pg (ref 26.0–34.0)
MCHC: 32.4 g/dL (ref 30.0–36.0)
MCV: 93.2 fL (ref 78.0–100.0)
MONO ABS: 0.9 10*3/uL (ref 0.1–1.0)
Monocytes Relative: 7 %
Neutro Abs: 7.4 10*3/uL (ref 1.7–7.7)
Neutrophils Relative %: 57 %
PLATELETS: 549 10*3/uL — AB (ref 150–400)
RBC: 4.54 MIL/uL (ref 3.87–5.11)
RDW: 15.1 % (ref 11.5–15.5)
WBC: 13 10*3/uL — AB (ref 4.0–10.5)

## 2015-08-02 LAB — URINE CULTURE: Culture: NO GROWTH

## 2015-08-02 LAB — MAGNESIUM: Magnesium: 2.5 mg/dL — ABNORMAL HIGH (ref 1.7–2.4)

## 2015-08-02 LAB — TSH: TSH: 2.928 u[IU]/mL (ref 0.350–4.500)

## 2015-08-02 MED ORDER — SODIUM CHLORIDE 0.45 % IV SOLN
INTRAVENOUS | Status: DC
  Administered 2015-08-02 – 2015-08-03 (×2): via INTRAVENOUS

## 2015-08-02 MED ORDER — SODIUM CHLORIDE 0.9 % IV BOLUS (SEPSIS)
500.0000 mL | Freq: Once | INTRAVENOUS | Status: AC
  Administered 2015-08-02: 500 mL via INTRAVENOUS

## 2015-08-02 MED ORDER — ACETAMINOPHEN 160 MG/5ML PO SOLN
650.0000 mg | Freq: Four times a day (QID) | ORAL | Status: DC | PRN
  Administered 2015-08-04 – 2015-08-06 (×2): 650 mg via ORAL
  Filled 2015-08-02 (×2): qty 20.3

## 2015-08-02 MED ORDER — METOPROLOL TARTRATE 50 MG PO TABS: 50.0000 mg | ORAL_TABLET | Freq: Two times a day (BID) | ORAL | Status: DC

## 2015-08-02 MED ORDER — METOPROLOL TARTRATE 25 MG PO TABS
25.0000 mg | ORAL_TABLET | Freq: Two times a day (BID) | ORAL | Status: DC
Start: 2015-08-02 — End: 2015-08-04
  Administered 2015-08-02 – 2015-08-04 (×4): 25 mg via ORAL
  Filled 2015-08-02 (×4): qty 1

## 2015-08-02 MED ORDER — QUETIAPINE FUMARATE 25 MG PO TABS
12.5000 mg | ORAL_TABLET | Freq: Every day | ORAL | Status: DC
  Administered 2015-08-02 – 2015-08-05 (×4): 12.5 mg via ORAL
  Filled 2015-08-02 (×4): qty 1

## 2015-08-02 NOTE — Progress Notes (Addendum)
Rehab admissions - Patient is sleepy, no speaking much and is confused.  I will contact sister to discuss possible inpatient rehab admission once patient is medically ready for rehab.  Call me for questions.  #[phone]  I spoke with sister, Cynthia Murray, by telephone.  The plan is for patient to go home after rehab stay with dad and sister, Cynthia Murray, who will be the caregiver.  #[phone]

## 2015-08-02 NOTE — Progress Notes (Signed)
Patient has said very little today. She has not been answering questions. She did say a curse word very definitively but did not say anything else. At times patient appears to be whispering but it's not audible.  Pupils have been dilated but responsive to light. Pt has blisters on the left forearm that are covered in a foam dressing and seem to be healing. Patient's buttock skin appears to be peeling a large amount.

## 2015-08-02 NOTE — Progress Notes (Signed)
[place] TEAM 1 - Stepdown/ICU TEAM PROGRESS NOTE  Lendora Keys VQQ:[id number] DOB: 1970/08/13 DOA: 07/17/2015 PCP: Marcial Pacas, DO  Admit HPI / Brief Narrative: 45 yo F Hx tobacco abuse who was refered to [facility] from [facility] ER where she presented with a 5 day hx of cough, sob, weakness, pleuritic chest pain, headache, and chills. She was found to be hypotensive and hypoxic. CXR showed LLL PNA. She was started on Levophed and IV Abx /Zithromax . ABG showed pH7.39 / PCO2 27 / Po2 56. Lactate was elevated at 4.1 BNP elevated ~2700. Troponin neg. WBC elevated~17K .    Pt is a heavy smoker. CT chest 02/12/15 showed a 4.7 x 3.4 cm mass in LLL. According to office notes she was recommended to have repeat CT chest but declined due to financial reasons.   Significant Events / Procedures: 12/31 Transfer from [place], [place], Sepsis 12/31 CT head without contrast - negative acute findings 12/31 CT chest wo contrast - Lobar pneumonia of the left upper lobe with developing bronchopneumonia throughout the remaining portions of the lungs, presumably from endobronchial spread of infection 1/01 ARDS protocol 1/2 TTE - systolic function WNL - grade 1 DD - PA pressure: 47 mm Hg  1/10 patient removed her own Cortrak, replaced with OGT 1/11 Extubated 1/14 MRI brain - premature for age atrophy - likely chronic microvascular ischemic change 1/14 TRH assumed care  1/16 EEG - generalized irregular slow activity with no evidence of seizure activity  HPI/Subjective: The pt is alert at the time of my exam, but not conversant.  She is staring off into space, and only intermittently acknowledges the examiner.  She can not provide a reliable hx.    Assessment/Plan:  Septic shock w/ Acute Hypoxic Respiratory Failuredue to Severe CAP > ARDS -Completed course of antibiotics - ARDS and shock resolved   Acute Encephalopathy / Delirium -back on home psychiatry medications -Brain MRI nondiagnostic -EEG  unrevealing  -TSH 2.928 -complete metabolic w/u   Hypernatremia  -most c/w DH due to poor intake - hydrate IV and follow   H/O Depression / Anxiety  -Continuing home med regimen   Deconditioning  -Patient unable to cooperate with PT in order to complete evaluation - suspect prolonged rehab stay will be required   Hypertension -BP variable from 90 to 433 systolic - follow - no BP meds for now   Pulmonary hypertension -PA pressure 55mHg on TTE   Chronic pain syndrome -H/O Headaches & Chronic Back Pain -Patient not complaining of pain and given her poor cognition would hold all mind altering/sedating medications as possible   Hypokalemia -Potassium at goal   Code Status: FULL Family Communication: no family present at time of exam - spoke w/ family via phone  Disposition Plan: SDU   Consultants: PCCM  Antibiotics: Vanc 12/31 > 1/2  Vanc 1/6 > 1/8 Zosyn 12/31 > 1/2 Ceftriaxone 1/2 > 1/6 Ceftaz 1/6 > 1/9  DVT prophylaxis: SQ heparin   Objective: Blood pressure 121/77, pulse 104, temperature 98.4 F (36.9 C), temperature source Oral, resp. rate 33, height 5' 7"  (1.702 m), weight 120.3 kg (265 lb 3.4 oz), last menstrual period 07/31/2015, SpO2 96 %.  Intake/Output Summary (Last 24 hours) at 08/02/15 1538 Last data filed at 08/02/15 1439  Gross per 24 hour  Intake    300 ml  Output    750 ml  Net   -450 ml   Exam: General: No acute respiratory distress evident  Lungs: Clear to auscultation bilaterally  without wheezes or crackles Cardiovascular: Regular rate and rhythm without murmur gallop or rub normal S1 and S2 Abdomen: Nontender, nondistended, soft, bowel sounds positive, no rebound, no ascites, no appreciable mass Extremities: No significant cyanosis, clubbing, or edema bilateral lower extremities  Data Reviewed:  Basic Metabolic Panel:  Recent Labs Lab 07/27/15 0248 07/28/15 0212 07/29/15 0330 07/30/15 0528 07/31/15 1045 08/01/15 0425  08/02/15 0443  NA 137 137 137 139 144 148* 152*  K 4.3 4.7 4.0 3.0* 3.0* 3.3* 4.1  CL 100* 97* 100* 104 109 111 119*  CO2 29 28 26 23 22 22  21*  GLUCOSE 110* 91 116* 110* 146* 123* 116*  BUN 18 16 18  29* 28* 29* 28*  CREATININE 0.43* 0.48 0.48 0.64 0.62 0.62 0.70  CALCIUM 9.0 9.4 9.7 9.7 9.9 10.2 10.1  MG 1.7 2.0 2.2  --  2.2 2.3 2.5*  PHOS 3.3 4.1 4.3  --   --   --   --     CBC:  Recent Labs Lab 07/28/15 0212 07/29/15 0330 07/31/15 1045 08/01/15 0425 08/02/15 0443  WBC 18.7* 15.6* 13.3* 13.5* 13.0*  NEUTROABS 14.0*  --  9.3* 9.0* 7.4  HGB 12.3 12.8 13.2 13.2 13.7  HCT 38.1 38.2 39.2 40.1 42.3  MCV 91.1 88.4 89.9 91.6 93.2  PLT 323 455* 529* 535* 549*    Liver Function Tests:  Recent Labs Lab 07/31/15 1045 08/01/15 0425 08/02/15 0443  AST 18 16 18   ALT 36 32 28  ALKPHOS 74 66 61  BILITOT 0.9 0.6 0.7  PROT 8.1 7.7 8.3*  ALBUMIN 3.8 3.6 4.0   CBG:  Recent Labs Lab 07/26/15 2030 07/27/15 0049 07/27/15 0438 07/27/15 0841 07/27/15 1122  GLUCAP 133* 103* 107* 119* 112*    Recent Results (from the past 240 hour(s))  C difficile quick scan w PCR reflex     Status: None   Collection Time: 07/26/15  1:29 PM  Result Value Ref Range Status   C Diff antigen NEGATIVE NEGATIVE Final   C Diff toxin NEGATIVE NEGATIVE Final   C Diff interpretation Negative for toxigenic C. difficile  Final  Culture, Urine     Status: None   Collection Time: 07/31/15  4:45 PM  Result Value Ref Range Status   Specimen Description URINE, CATHETERIZED  Final   Special Requests NONE  Final   Culture NO GROWTH 2 DAYS  Final   Report Status 08/02/2015 FINAL  Final  Culture, blood (routine x 2)     Status: None (Preliminary result)   Collection Time: 07/31/15  6:40 PM  Result Value Ref Range Status   Specimen Description BLOOD RIGHT ANTECUBITAL  Final   Special Requests IN PEDIATRIC BOTTLE 1CC  Final   Culture NO GROWTH 2 DAYS  Final   Report Status PENDING  Incomplete  Culture,  blood (routine x 2)     Status: None (Preliminary result)   Collection Time: 07/31/15  6:45 PM  Result Value Ref Range Status   Specimen Description BLOOD RIGHT HAND  Final   Special Requests IN PEDIATRIC BOTTLE 1CC  Final   Culture NO GROWTH 2 DAYS  Final   Report Status PENDING  Incomplete     Studies:   Recent x-ray studies have been reviewed in detail by the Attending Physician  Scheduled Meds:  Scheduled Meds: . busPIRone  7.5 mg Oral BID  . famotidine  20 mg Oral BID  . feeding supplement (GLUCERNA SHAKE)  237 mL Oral BID BM  .  FLUoxetine  40 mg Oral Daily  . heparin subcutaneous  5,000 Units Subcutaneous 3 times per day  . metoprolol tartrate  25 mg Oral BID  . potassium chloride  40 mEq Oral Daily    Time spent on care of this patient: 35 mins   Ceriah Kohler T , MD   [facility] Office  [phone] Pager - Text Page per Shea Evans as per below:  On-Call/Text Page:      [email or web address]      password [email or web address]  If 7PM-7AM, please contact night-coverage [email or web address] Password [email or web address] 08/02/2015, 3:38 PM   LOS: 16 days

## 2015-08-02 NOTE — Procedures (Signed)
History: 46 year old female admitted for pneumonia with altered mental status.  Sedation: None  Technique: This is a 21 channel routine scalp EEG performed at the bedside with bipolar and monopolar montages arranged in accordance to the international 10/20 system of electrode placement. One channel was dedicated to EKG recording.    Background:  There is a posterior dominant rhythm of 8.5 Hz. There is also generalized irregular theta > delta activity throughout the recording. Sleep was not recorded  Photic stimulation: Physiologic driving is not performed  EEG Abnormalities: 1) Generalized irregular slow activity  Clinical Interpretation: This EEG is consistent with a generalized non-specific cerebral dysfunction(encephalopathy). There was no seizure or seizure predisposition recorded on this study.   Roland Rack, MD [facility] [phone]  If 7pm- 7am, please page neurology on call as listed in [place].

## 2015-08-02 NOTE — Care Management Note (Signed)
Case Management Note  Patient Details  Name: Cynthia Murray MRN: [id number] Date of Birth: Aug 22, 1970  Subjective/Objective:   NCM spoke with sister [name], she states yes the plan is for patient to come home with her and her Dad,  NCM explained to [place] that patient is plus 2 person ast right now but maybe will improve with more therapy, we will keep her updated on this information.                     Action/Plan:   Expected Discharge Date:                  Expected Discharge Plan:  IP Rehab Facility  In-House Referral:  Clinical Social Work  Discharge planning Services  CM Consult  Post Acute Care Choice:    Choice offered to:     DME Arranged:    DME Agency:     HH Arranged:    [place] Agency:     Status of Service:  In process, will continue to follow  Medicare Important Message Given:    Date Medicare IM Given:    Medicare IM give by:    Date Additional Medicare IM Given:    Additional Medicare Important Message give by:     If discussed at [place] of Stay Meetings, dates discussed:    Additional Comments:  Zenon Mayo, RN 08/02/2015, 4:29 PM

## 2015-08-02 NOTE — Progress Notes (Signed)
EEG Completed; Results Pending  

## 2015-08-03 LAB — COMPREHENSIVE METABOLIC PANEL
ALBUMIN: 3.8 g/dL (ref 3.5–5.0)
ALT: 33 U/L (ref 14–54)
ANION GAP: 11 (ref 5–15)
AST: 38 U/L (ref 15–41)
Alkaline Phosphatase: 57 U/L (ref 38–126)
BILIRUBIN TOTAL: 1.4 mg/dL — AB (ref 0.3–1.2)
BUN: 30 mg/dL — ABNORMAL HIGH (ref 6–20)
CO2: 23 mmol/L (ref 22–32)
Calcium: 10.1 mg/dL (ref 8.9–10.3)
Chloride: 116 mmol/L — ABNORMAL HIGH (ref 101–111)
Creatinine, Ser: 0.77 mg/dL (ref 0.44–1.00)
GFR calc Af Amer: >60 mL/min (ref >60)
GFR calc non Af Amer: >60 mL/min (ref >60)
GLUCOSE: 96 mg/dL (ref 65–99)
POTASSIUM: 4.5 mmol/L (ref 3.5–5.1)
SODIUM: 150 mmol/L — AB (ref 135–145)
TOTAL PROTEIN: 7.5 g/dL (ref 6.5–8.1)

## 2015-08-03 LAB — AMMONIA: AMMONIA: 83 umol/L — AB (ref 9–35)

## 2015-08-03 LAB — FOLATE: FOLATE: 28.8 ng/mL (ref >5.9)

## 2015-08-03 LAB — CBC
HEMATOCRIT: 43.3 % (ref 36.0–46.0)
Hemoglobin: 13.8 g/dL (ref 12.0–15.0)
MCH: 30.3 pg (ref 26.0–34.0)
MCHC: 31.9 g/dL (ref 30.0–36.0)
MCV: 95.2 fL (ref 78.0–100.0)
PLATELETS: 482 10*3/uL — AB (ref 150–400)
RBC: 4.55 MIL/uL (ref 3.87–5.11)
RDW: 15.1 % (ref 11.5–15.5)
WBC: 14.3 10*3/uL — ABNORMAL HIGH (ref 4.0–10.5)

## 2015-08-03 LAB — VITAMIN B12: VITAMIN B 12: 1387 pg/mL — AB (ref 180–914)

## 2015-08-03 LAB — RPR: RPR: NONREACTIVE

## 2015-08-03 MED ORDER — FLUOXETINE HCL 20 MG/5ML PO SOLN
20.0000 mg | Freq: Every day | ORAL | Status: DC
  Administered 2015-08-04 – 2015-08-08 (×5): 20 mg via ORAL
  Filled 2015-08-03 (×5): qty 5

## 2015-08-03 MED ORDER — DEXTROSE 5 % IV SOLN
INTRAVENOUS | Status: DC
Start: 2015-08-03 — End: 2015-08-06
  Administered 2015-08-03 – 2015-08-04 (×3): via INTRAVENOUS

## 2015-08-03 MED ORDER — BUSPIRONE HCL 5 MG PO TABS
5.0000 mg | ORAL_TABLET | Freq: Two times a day (BID) | ORAL | Status: DC
  Administered 2015-08-03 – 2015-08-08 (×10): 5 mg via ORAL
  Filled 2015-08-03 (×12): qty 1

## 2015-08-03 NOTE — Progress Notes (Signed)
[place] TEAM 1 - Stepdown/ICU TEAM Progress Note  Cynthia Murray ZMO:[id number] DOB: 1970-08-18 DOA: 07/17/2015 PCP: Marcial Pacas, DO  Admit HPI / Brief Narrative: 45 yo WF PMHx Tobacco abuse (current smoker),  Refered to [facility] from [facility] ER . She presented with 5 day hx of cough, sob , weakness , pleuritic chest pain, headache and chills. She was found to be hypotensive and hypoxic. CXR showed LLL PNA. She was started on Levophed and IV Abx /Zithromax . ABG showed pH7.39/PCO2 27 /Po2 at 56. Pt complains that her head is hurting bad. She feels sick all over . Has chronic back pain that is hurting worse. Lactate was elevated at 4.1 BNP elevated ~2700. Troponin neg. WBC elevated~17K . Pt says she has had pink tinged mucus mixed with green for last 5 days.  Pt is a heavy smoker . 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 She was treated with abx with clinical improvement . According to office notes review she was recommended to have repeat CT chest but declined due to financial reasons. She denies any vomiting or diarrhea.  No abdominal pain, urinary symptoms.   HPI/Subjective: 1/17 overnight afebrile, A/O 1 (does not know when, where, why), attempts to answer all questions appropriately. Perseveration. Follows some commands. States the nurse stole her And was keeping it outside from her.   Assessment/Plan: Acute Hypoxic Respiratory Failure:  -Secondary to ARDS from Very Severe CAP & pulmonary edema.  -Continuing pulmonary toilet. Continuing Lasix by mouth twice a day.  Septic Shock/severe CAP:  -Shock has resolved.  -Completed course of antibiotics. -1/14 patient continues to have leukocytosis with fevers despite completing course of antibiotics. -blood culture, urine culture, PCXR; nondiagnostic  Leukocytosis -See septic shock  Acute Encephalopathy/Delirium/psychogenic?:  -Persisting and seemingly largely unchanged.  -Patient restarted on home psychiatry  medications -Brain MRI; nondiagnostic -EEG; negative seizure; neurology believes metabolic encephalopathy may clear eventually -TSH WNL -Neurology also believes may infiltrate if we decrease dose of psych meds   H/O Depression/Anxiety:  -Decrease Buspar  5 mg  BID -Decrease Prozac. 20 mg daily -Continue Seroquel 12.5 mg QHS  Deconditioning:  -Patient unable to cooperate with PT in order to complete evaluation .  Hypertension:  -BP soft medication on hold.  Pulmonary hypertension  Chronic pain syndrome -H/O Headaches & Chronic Back Pain:  -Patient not complaining of pain and given her poor cognition would hold all mind altering/sedating medication if possible   HypokalemiaNutrition  -Potassium goal> 4 -Continue potassium 40 mEq daily   Hypernatremia -D5W 67m/hr  Diet:  -Recommended for a dysphagia 3 diet with nectar thickened. -Patient not eating close to the required caloric intake. Requested CorTrak place NG tube in the A.m. for nutrition    Code Status: FULL Family Communication: no family present at time of exam Disposition Plan: Resolution acute mental status change    Consultants: Dr.Jennings E Nestor PCCM    Procedure/Significant Events: 12[place]/31 CT head without contrast; negative acute findings 12/31 CT chest wo contrast;Lobar pneumonia of the left upper lobe with developing bronchopneumonia throughout the remaining portions of the lungs, presumably from endobronchial spread of infection. 01/01 ARDS protocol 1/2 echocardiogram;Left ventricle: Systolic function WNL - (grade 1 diastolicdysfunction). - Pulmonary arteries: PA peak pressure: 47 mm Hg (S).1/3 Supine 1/10 Patient removed her own Cortrak, replaced with OGT 1/11 Extubated 1/14 MRI brain;-Premature for age atrophy. - likely chronic microvascular ischemic change. 1/16 EEG; C/W  generalized non-specific  cerebral dysfunction(encephalopathy). No seizure       Culture 12/31 Blood ([place]) >> NEG 12/31 Pneumococcal Ag >> POSITIVE 12/31 Legionella Ag >> NEG 12/31 Sputum >> NEG 01/01 HIV >> NEG 01/06 Sputum >> NEG 01/05 Blood >> NEG 01/09 C. Diff. PCR >> NEGATIVE 1/14 urine NGTD 1/14 blood right AC/hand NGTD   Antibiotics: Vanc 12/31 >>1/2  Vanc 1/6 (fever) >> 1/8 Zosyn 12/31 >>1/2 Ceftriaxone 1/2 >>> 1/6 Ceftaz 1/6 (fever) >>> 1/9   DVT prophylaxis: Subcutaneous heparin   Devices    LINES / TUBES:      Continuous Infusions: . dextrose 75 mL/hr at 08/03/15 1758    Objective: VITAL SIGNS: Temp: 97.9 F (36.6 C) (01/17 1931) Temp Source: Axillary (01/17 1931) BP: 106/71 mmHg (01/17 1937) Pulse Rate: 95 (01/17 1937) SPO2; FIO2:   Intake/Output Summary (Last 24 hours) at 08/03/15 2147 Last data filed at 08/03/15 1644  Gross per 24 hour  Intake    675 ml  Output    675 ml  Net      0 ml     Exam: General: A/O 1 (does not know when, where, why), attempts to answer all questions appropriately. Perseveration. Believes RN  Stole cat and is Government social research officer outside. No acute respiratory distress Eyes: negative scleral hemorrhage ENT: Negative Runny nose, negative gingival bleeding, Neck:  Negative scars, masses, torticollis, lymphadenopathy, JVD Lungs: decreased breath sounds diffusely left lung fields, right lung field clear to auscultation, negative wheezes, negative crackles Cardiovascular: Regular rate and rhythm without murmur gallop or rub normal S1 and S2 Abdomen: Morbidly obese negative abdominal pain, nondistended, positive soft, bowel sounds, no rebound, no ascites, no appreciable mass Extremities: No significant cyanosis, clubbing, or edema bilateral lower extremities Psychiatric:  Negative depression, negative anxiety, negative fatigue, negative mania positive delusions Neurologic:  Would not participate today, Positive dysarthria, positive expressive aphasia, positive receptive  aphasia.   Data Reviewed: Basic Metabolic Panel:  Recent Labs Lab 07/28/15 0212 07/29/15 0330 07/30/15 0528 07/31/15 1045 08/01/15 0425 08/02/15 0443 08/03/15 0445  NA 137 137 139 144 148* 152* 150*  K 4.7 4.0 3.0* 3.0* 3.3* 4.1 4.5  CL 97* 100* 104 109 111 119* 116*  CO2 28 26 23 22 22  21* 23  GLUCOSE 91 116* 110* 146* 123* 116* 96  BUN 16 18 29* 28* 29* 28* 30*  CREATININE 0.48 0.48 0.64 0.62 0.62 0.70 0.77  CALCIUM 9.4 9.7 9.7 9.9 10.2 10.1 10.1  MG 2.0 2.2  --  2.2 2.3 2.5*  --   PHOS 4.1 4.3  --   --   --   --   --    Liver Function Tests:  Recent Labs Lab 07/31/15 1045 08/01/15 0425 08/02/15 0443 08/03/15 0445  AST 18 16 18  38  ALT 36 32 28 33  ALKPHOS 74 66 61 57  BILITOT 0.9 0.6 0.7 1.4*  PROT 8.1 7.7 8.3* 7.5  ALBUMIN 3.8 3.6 4.0 3.8   No results for input(s): LIPASE, AMYLASE in the last 168 hours.  Recent Labs Lab 08/03/15 0445  AMMONIA 83*   CBC:  Recent Labs Lab 07/28/15 0212 07/29/15 0330 07/31/15 1045 08/01/15 0425 08/02/15 0443 08/03/15 0445  WBC 18.7* 15.6* 13.3* 13.5* 13.0* 14.3*  NEUTROABS 14.0*  --  9.3* 9.0* 7.4  --   HGB 12.3 12.8 13.2 13.2 13.7 13.8  HCT 38.1 38.2 39.2 40.1 42.3 43.3  MCV 91.1 88.4 89.9 91.6 93.2 95.2  PLT 323 455* 529* 535* 549* 482*  Cardiac Enzymes: No results for input(s): CKTOTAL, CKMB, CKMBINDEX, TROPONINI in the last 168 hours. BNP (last 3 results) No results for input(s): BNP in the last 8760 hours.  ProBNP (last 3 results) No results for input(s): PROBNP in the last 8760 hours.  CBG: No results for input(s): GLUCAP in the last 168 hours.  Recent Results (from the past 240 hour(s))  C difficile quick scan w PCR reflex     Status: None   Collection Time: 07/26/15  1:29 PM  Result Value Ref Range Status   C Diff antigen NEGATIVE NEGATIVE Final   C Diff toxin NEGATIVE NEGATIVE Final   C Diff interpretation Negative for toxigenic C. difficile  Final  Culture, Urine     Status: None    Collection Time: 07/31/15  4:45 PM  Result Value Ref Range Status   Specimen Description URINE, CATHETERIZED  Final   Special Requests NONE  Final   Culture NO GROWTH 2 DAYS  Final   Report Status 08/02/2015 FINAL  Final  Culture, blood (routine x 2)     Status: None (Preliminary result)   Collection Time: 07/31/15  6:40 PM  Result Value Ref Range Status   Specimen Description BLOOD RIGHT ANTECUBITAL  Final   Special Requests IN PEDIATRIC BOTTLE 1CC  Final   Culture NO GROWTH 3 DAYS  Final   Report Status PENDING  Incomplete  Culture, blood (routine x 2)     Status: None (Preliminary result)   Collection Time: 07/31/15  6:45 PM  Result Value Ref Range Status   Specimen Description BLOOD RIGHT HAND  Final   Special Requests IN PEDIATRIC BOTTLE 1CC  Final   Culture NO GROWTH 3 DAYS  Final   Report Status PENDING  Incomplete     Studies:  Recent x-ray studies have been reviewed in detail by the Attending Physician  Scheduled Meds:  Scheduled Meds: . busPIRone  7.5 mg Oral BID  . famotidine  20 mg Oral BID  . feeding supplement (GLUCERNA SHAKE)  237 mL Oral BID BM  . FLUoxetine  40 mg Oral Daily  . heparin subcutaneous  5,000 Units Subcutaneous 3 times per day  . metoprolol tartrate  25 mg Oral BID  . potassium chloride  40 mEq Oral Daily  . QUEtiapine  12.5 mg Oral QHS    Time spent on care of this patient: 40 mins   WOODS, Geraldo Docker , MD  [facility] Office  [phone] Pager [phone]  On-Call/Text Page:      [email or web address]      password [email or web address]  If 7PM-7AM, please contact night-coverage [email or web address] Password [email or web address] 08/03/2015, 9:47 PM   LOS: 17 days   Care during the described time interval was provided by me .  I have reviewed this patient's available data, including medical history, events of note, physical examination, and all test results as part of my evaluation. I have personally reviewed and interpreted all radiology studies.   Dia Crawford,  MD [phone] Pager

## 2015-08-03 NOTE — Care Management Note (Addendum)
Case Management Note  Patient Details  Name: Cynthia Murray MRN: [id number] Date of Birth: 1970/11/29  Subjective/Objective:    Levada Dy is POA [phone]  Patient is a total care patient, she was extubated on 1/11. Pupils dilated. On thin liquids today.  NCM will cont to follow for dc needs.  Pt rec CIR, per CIR rep note family wants to take patient home after rehab, they  think patient will improve enough for them to take her home after rehab. NCM will cont to follow for dc needs.    Patient is alert and oriented x 2, 90% of time not making talking out of head, but this am RN states she had a good conversation with patient.  Patient has only been eating 0-10% of food, but inform RN that she will began to eat better because she does not want the NG tube, she is on D5 at 75. NCM will cont to follow for dc needs.  Levada Dy states patient has a PCP Marcial Pacas in [place].  NCM asked if she would like to see our Doctors at the [facility] and [place] states she will have to let me know later, if so she will need an appointment made.         Action/Plan:   Expected Discharge Date:                  Expected Discharge Plan:  IP Rehab Facility  In-House Referral:  Clinical Social Work  Discharge planning Services  CM Consult  Post Acute Care Choice:    Choice offered to:     DME Arranged:    DME Agency:     HH Arranged:    [place] Agency:     Status of Service:  In process, will continue to follow  Medicare Important Message Given:    Date Medicare IM Given:    Medicare IM give by:    Date Additional Medicare IM Given:    Additional Medicare Important Message give by:     If discussed at [place] of Stay Meetings, dates discussed:    Additional Comments:  Zenon Mayo, RN 08/03/2015, 4:14 PM

## 2015-08-03 NOTE — Progress Notes (Signed)
Speech Language Pathology Treatment: Dysphagia  Patient Details Name: Cynthia Murray MRN: [id number] DOB: October 21, 1970 Today's Date: 08/03/2015 Time: 3267-1245 SLP Time Calculation (min) (ACUTE ONLY): 15 min  Assessment / Plan / Recommendation Clinical Impression  Acute reversible dysphagia following prolonged intubation has resolved. Pt independently tolerating regular solids and thin liquids with no signs of aspiration. No SLP f/u needed.    HPI HPI: 45 y/o female with septic shock, ARDS from pneumococcal lobar pneumonia. Intubated from 12/31 to 1/11.       SLP Plan  All goals met;Discharge SLP treatment due to (comment)     Recommendations  Diet recommendations: Regular;Thin liquid             Plan: All goals met;Discharge SLP treatment due to (comment)     GO               Herbie Baltimore, MA CCC-SLP [phone]  Cynthia Murray 08/03/2015, 9:22 AM

## 2015-08-03 NOTE — Consult Note (Signed)
NEURO HOSPITALIST CONSULT NOTE   Referring physician: Dr Sherral Hammers Reason for Consult: altered mental status  HPI:                                                                                                                                          Cynthia Murray is an 45 y.o. female with a past medical history significant for HTN, smoker, transferred to [facility] ICU on 12/31 for further management of septic shock, ARDS from pneumococcal lobar pneumonia. While on ICU developed altered mental status felt to be secondary to ICU delirium but remains with lingering mental state changes and thus neurology has been asked to see patient. Review of her chart reveals that MRI brain brain performed 07/31/15 showed no acute abnormality, and EEG on 1/16 showed no electrographic seizures but a pattern consistent with global cerebral dysfunction compatible with a non specific encephalopathy. Available serologies from today were reviewed and significant for : ammonia 83, Na 150, wbc 14.3 Patient is alert and awake, engages in conversation but at times her conversations make no sense. Denies HA, vertigo, double vision, focal weakness or numbness, slurred speech, language or vision impairment. Importantly, nurse reports that " Patient has been very paranoid this morning. She stated, "those people out there are going to put me on the floor." Pt also asked if I was seeing flashbacks of "[facility]" and said that we were in the [facility]. Patient has also been angry this morning and been cursing and threatening".  On Seroquel+Buspar+fluoxetine.   Past Medical History  Diagnosis Date  . Hypertension     Past Surgical History  Procedure Laterality Date  . Hand surgery  1994    left hand  . Removal of cyst between lungs  1998  . Appendectomy  2004  . Cholecystectomy  2004    Family History  Problem Relation Age of Onset  . Breast cancer      grandmother  . Heart attack Father   .  Depression Mother     father  . Diabetes Father   . Hyperlipidemia Father   . Hypertension Father     Family History: no MS, epilepsy, or brain aneurysm   Social History:  reports that she has been smoking Cigarettes.  She has a 15 pack-year smoking history. She does not have any smokeless tobacco history on file. She reports that she drinks alcohol. She reports that she does not use illicit drugs.  No Known Allergies  MEDICATIONS:  Scheduled: . busPIRone  7.5 mg Oral BID  . famotidine  20 mg Oral BID  . feeding supplement (GLUCERNA SHAKE)  237 mL Oral BID BM  . FLUoxetine  40 mg Oral Daily  . heparin subcutaneous  5,000 Units Subcutaneous 3 times per day  . metoprolol tartrate  25 mg Oral BID  . potassium chloride  40 mEq Oral Daily  . QUEtiapine  12.5 mg Oral QHS     ROS:                                                                                                                                       History obtained from chart review and the patient  General ROS: negative for - chills, night sweats, or weight loss Psychological ROS: negative for  memory difficulties, or suicidal ideation Ophthalmic ROS: negative for - blurry vision, double vision, eye pain or loss of vision ENT ROS: negative for - epistaxis, nasal discharge, oral lesions, sore throat, tinnitus or vertigo Allergy and Immunology ROS: negative for - hives or itchy/watery eyes Hematological and Lymphatic ROS: negative for - bleeding problems, bruising or swollen lymph nodes Endocrine ROS: negative for - galactorrhea, hair pattern changes, polydipsia/polyuria or temperature intolerance Respiratory ROS: negative for - cough, hemoptysis, shortness of breath or wheezing Cardiovascular ROS: negative for - chest pain, dyspnea on exertion, edema or irregular heartbeat Gastrointestinal ROS: negative for  - abdominal pain, diarrhea, hematemesis, nausea/vomiting or stool incontinence Genito-Urinary ROS: negative for - dysuria, hematuria, incontinence or urinary frequency/urgency Musculoskeletal ROS: negative for - joint swelling Neurological ROS: as noted in HPI Dermatological ROS: negative for rash and skin lesion changes    Physical exam:  Constitutional: overweight, pleasant female in no apparent distress. Blood pressure 109/74, pulse 92, temperature 98.8 F (37.1 C), temperature source Oral, resp. rate 26, height 5' 7"  (1.702 m), weight 120.3 kg (265 lb 3.4 oz), last menstrual period 07/31/2015, SpO2 97 %. Eyes: no jaundice or exophthalmos.  Head: normocephalic. Neck: supple, no bruits, no JVD. Cardiac: no murmurs. Lungs: clear. Abdomen: soft, no tender, no mass. Extremities: no edema, clubbing, or cyanosis.  Skin: no rash   Neurologic Examination:                                                                                                      General: NAD Mental Status: Alert, awake, oriented to year-month, knows she is in the hospital but doesn't know the name, thought content at times inappropriate.  Speech  fluent without evidence of aphasia.  Able to follow 3 step commands without difficulty. Cranial Nerves: II: Discs flat bilaterally; Visual fields grossly normal, pupils equal, round, reactive to light and accommodation III,IV, VI: ptosis not present, extra-ocular motions intact bilaterally V,VII: smile symmetric, facial light touch sensation normal bilaterally VIII: hearing normal bilaterally IX,X: uvula rises symmetrically XI: bilateral shoulder shrug XII: midline tongue extension without atrophy or fasciculations  Motor: Right : Upper extremity   5/5    Left:     Upper extremity   5/5  Lower extremity   5/5     Lower extremity   5/5 Tone and bulk:normal tone throughout; no atrophy noted Sensory: Pinprick and light touch intact throughout, bilaterally Deep Tendon  Reflexes:  Right: Upper Extremity   Left: Upper extremity   biceps (C-5 to C-6) 2/4   biceps (C-5 to C-6) 2/4 tricep (C7) 2/4    triceps (C7) 2/4 Brachioradialis (C6) 2/4  Brachioradialis (C6) 2/4  Lower Extremity Lower Extremity  quadriceps (L-2 to L-4) 2/4   quadriceps (L-2 to L-4) 2/4 Achilles (S1) 2/4   Achilles (S1) 2/4  Plantars: Right: downgoing   Left: downgoing Cerebellar: Significant for bilateral kinetic tremor Gait: No tested due to multiple leads    No results found for: CHOL  Results for orders placed or performed during the hospital encounter of 07/17/15 (from the past 48 hour(s))  Comprehensive metabolic panel     Status: Abnormal   Collection Time: 08/02/15  4:43 AM  Result Value Ref Range   Sodium 152 (H) 135 - 145 mmol/L   Potassium 4.1 3.5 - 5.1 mmol/L    Comment: DELTA CHECK NOTED NO VISIBLE HEMOLYSIS    Chloride 119 (H) 101 - 111 mmol/L   CO2 21 (L) 22 - 32 mmol/L   Glucose, Bld 116 (H) 65 - 99 mg/dL   BUN 28 (H) 6 - 20 mg/dL   Creatinine, Ser 0.70 0.44 - 1.00 mg/dL   Calcium 10.1 8.9 - 10.3 mg/dL   Total Protein 8.3 (H) 6.5 - 8.1 g/dL   Albumin 4.0 3.5 - 5.0 g/dL   AST 18 15 - 41 U/L   ALT 28 14 - 54 U/L   Alkaline Phosphatase 61 38 - 126 U/L   Total Bilirubin 0.7 0.3 - 1.2 mg/dL   GFR calc non Af Amer >60 >60 mL/min   GFR calc Af Amer >60 >60 mL/min    Comment: (NOTE) The eGFR has been calculated using the CKD EPI equation. This calculation has not been validated in all clinical situations. eGFR's persistently <60 mL/min signify possible Chronic Kidney Disease.    Anion gap 12 5 - 15  Magnesium     Status: Abnormal   Collection Time: 08/02/15  4:43 AM  Result Value Ref Range   Magnesium 2.5 (H) 1.7 - 2.4 mg/dL  CBC with Differential/Platelet     Status: Abnormal   Collection Time: 08/02/15  4:43 AM  Result Value Ref Range   WBC 13.0 (H) 4.0 - 10.5 K/uL   RBC 4.54 3.87 - 5.11 MIL/uL   Hemoglobin 13.7 12.0 - 15.0 g/dL   HCT 42.3 36.0  - 46.0 %   MCV 93.2 78.0 - 100.0 fL   MCH 30.2 26.0 - 34.0 pg   MCHC 32.4 30.0 - 36.0 g/dL   RDW 15.1 11.5 - 15.5 %   Platelets 549 (H) 150 - 400 K/uL   Neutrophils Relative % 57 %   Lymphocytes Relative 35 %  Monocytes Relative 7 %   Eosinophils Relative 0 %   Basophils Relative 1 %   Neutro Abs 7.4 1.7 - 7.7 K/uL   Lymphs Abs 4.6 (H) 0.7 - 4.0 K/uL   Monocytes Absolute 0.9 0.1 - 1.0 K/uL   Eosinophils Absolute 0.0 0.0 - 0.7 K/uL   Basophils Absolute 0.1 0.0 - 0.1 K/uL   WBC Morphology ATYPICAL LYMPHOCYTES   TSH     Status: None   Collection Time: 08/02/15  4:43 AM  Result Value Ref Range   TSH 2.928 0.350 - 4.500 uIU/mL  Vitamin B12     Status: Abnormal   Collection Time: 08/03/15  4:45 AM  Result Value Ref Range   Vitamin B-12 1387 (H) 180 - 914 pg/mL    Comment: (NOTE) This assay is not validated for testing neonatal or myeloproliferative syndrome specimens for Vitamin B12 levels.   Folate     Status: None   Collection Time: 08/03/15  4:45 AM  Result Value Ref Range   Folate 28.8 >5.9 ng/mL  Ammonia     Status: Abnormal   Collection Time: 08/03/15  4:45 AM  Result Value Ref Range   Ammonia 83 (H) 9 - 35 umol/L  CBC     Status: Abnormal   Collection Time: 08/03/15  4:45 AM  Result Value Ref Range   WBC 14.3 (H) 4.0 - 10.5 K/uL   RBC 4.55 3.87 - 5.11 MIL/uL   Hemoglobin 13.8 12.0 - 15.0 g/dL   HCT 43.3 36.0 - 46.0 %   MCV 95.2 78.0 - 100.0 fL   MCH 30.3 26.0 - 34.0 pg   MCHC 31.9 30.0 - 36.0 g/dL   RDW 15.1 11.5 - 15.5 %   Platelets 482 (H) 150 - 400 K/uL  Comprehensive metabolic panel     Status: Abnormal   Collection Time: 08/03/15  4:45 AM  Result Value Ref Range   Sodium 150 (H) 135 - 145 mmol/L   Potassium 4.5 3.5 - 5.1 mmol/L   Chloride 116 (H) 101 - 111 mmol/L   CO2 23 22 - 32 mmol/L   Glucose, Bld 96 65 - 99 mg/dL   BUN 30 (H) 6 - 20 mg/dL   Creatinine, Ser 0.77 0.44 - 1.00 mg/dL   Calcium 10.1 8.9 - 10.3 mg/dL   Total Protein 7.5 6.5 - 8.1 g/dL    Albumin 3.8 3.5 - 5.0 g/dL   AST 38 15 - 41 U/L   ALT 33 14 - 54 U/L   Alkaline Phosphatase 57 38 - 126 U/L   Total Bilirubin 1.4 (H) 0.3 - 1.2 mg/dL   GFR calc non Af Amer >60 >60 mL/min   GFR calc Af Amer >60 >60 mL/min    Comment: (NOTE) The eGFR has been calculated using the CKD EPI equation. This calculation has not been validated in all clinical situations. eGFR's persistently <60 mL/min signify possible Chronic Kidney Disease.    Anion gap 11 5 - 15    No results found.   Assessment/Plan: 45 y/o female admitted 12/31 for further management of septic shock, ARDS from pneumococcal lobar pneumonia, with lingering alteration in her mental status, non focal neuro-exam, unremarkable MRI brain, and EEG without electrographic seizures but evidence of global cerebral dysfunction. Evidence of some metabolic derangements, slight leukocytosis without fever or clear source of infection at this time. 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 She is on Seroquel+Buspar+fluoxetine but her clinical picture does not suggest a toxidrome (  unclear if the dose of these medications can be decreased). Sight leukocytosis without fever or meningeal irritation signs, thus CNS infection or even an autoimmune encephalopathy low in the differential. Will suggest correcting underlying metabolic abnormalities and allowing more time to see if her mental status returns to baseline before embarking in further neuro testing.  Dorian Pod, MD 08/03/2015, 11:56 AM  [facility]

## 2015-08-03 NOTE — Progress Notes (Signed)
Patient has been very paranoid this morning. She stated, "those people out there are going to put me on the floor." Pt also asked if I was seeing flashbacks of "The [facility]" and said that we were in the [place]. Patient has also been angry this morning and been cursing and threatening.

## 2015-08-03 NOTE — Progress Notes (Signed)
Physical Therapy Treatment Patient Details Name: Cynthia Murray MRN: [id number] DOB: 05-29-1971 Today's Date: 08/03/2015    History of Present Illness pt is a 45 y/o female with h/o HTN admitted from [facility] with cough fever, chills, SOB, pleuritic pain, HA.  CXR showed LLL PNA, ETT placed on arrival, 1/1   ARDS protocol initiated.     PT Comments    Rx limited to EOB for pt/therapist safety. Pt presenting with agitation and paranoia this AM. She was unable to stay on task or follow commands. She was speaking of someone stealing her 3 year old cat as well as being concerned about no one other than her wanting to live in [place]. When speaking to her about her weakness from being in the bed, she stated "we all know it's my verbage. That's the problem." RN reports pt hears voices from the hallway and fears people are trying to get her. PT to continue to follow and progress mobility, as appropriate.  Follow Up Recommendations  CIR     Equipment Recommendations  Other (comment) (TBD)    Recommendations for Other Services Rehab consult;OT consult     Precautions / Restrictions Precautions Precautions: Fall Precaution Comments: watch HR Restrictions Weight Bearing Restrictions: No    Mobility  Bed Mobility Overal bed mobility: +2 for physical assistance       Supine to sit: HOB elevated;Mod assist;+2 for physical assistance Sit to supine: Max assist;+2 for physical assistance   General bed mobility comments: total assist using bedpad to scoot up in bed  Transfers                 General transfer comment: not attempted for pt/therapist safety. Pt very confused, exhibiting paranoia and agitation.   Ambulation/Gait             General Gait Details: unable   Stairs            Wheelchair Mobility    Modified Rankin (Stroke Patients Only)       Balance   Sitting-balance support: Single extremity supported;Feet supported Sitting balance-Leahy Scale:  Fair Sitting balance - Comments: Pt sat EOB x 10-15 minutes with min guard assist.                            Cognition Arousal/Alertness: Awake/alert Behavior During Therapy: Anxious;Agitated Overall Cognitive Status: No family/caregiver present to determine baseline cognitive functioning Area of Impairment: Problem solving;Following commands;Orientation;Attention;Safety/judgement;Awareness Orientation Level: Disoriented to;Place;Time;Situation Current Attention Level: Focused Memory: Decreased short-term memory Following Commands: Follows one step commands inconsistently Safety/Judgement: Decreased awareness of safety;Decreased awareness of deficits Awareness: Intellectual Problem Solving: Slow processing;Decreased initiation;Requires verbal cues;Difficulty sequencing;Requires tactile cues General Comments: Pt exhibiting paranoia, agitation and confusion. She demonstrates nontangential speech, going from accusing someone of stealing her cat to wanting to move to [place].    Exercises General Exercises - Lower Extremity Ankle Circles/Pumps: AROM;Both;10 reps;Seated Long Arc Quad: AROM;Right;Left;5 reps;Seated    General Comments        Pertinent Vitals/Pain Pain Assessment: No/denies pain Faces Pain Scale: No hurt    Home Living                      Prior Function            PT Goals (current goals can now be found in the care plan section) Acute Rehab PT Goals Patient Stated Goal: none stated PT Goal Formulation: Patient unable to participate in  goal setting Time For Goal Achievement: 08/09/15 Potential to Achieve Goals: Good Progress towards PT goals: Not progressing toward goals - comment (paranoia/cognitive status hindering mobility progress)    Frequency  Min 3X/week    PT Plan Current plan remains appropriate    Co-evaluation             End of Session   Activity Tolerance: Treatment limited secondary to agitation;Other (comment)  (paranoia and confusion) Patient left: in bed;with call bell/phone within reach;with bed alarm set     Time: 3888-7579 PT Time Calculation (min) (ACUTE ONLY): 28 min  Charges:  $Therapeutic Activity: 23-37 mins                    G Codes:      Lorriane Shire 08/03/2015, 11:55 AM

## 2015-08-04 MED ORDER — BUTALBITAL-APAP-CAFFEINE 50-325-40 MG PO TABS
1.0000 | ORAL_TABLET | Freq: Four times a day (QID) | ORAL | Status: DC | PRN
  Administered 2015-08-04 – 2015-08-06 (×3): 2 via ORAL
  Filled 2015-08-04 (×3): qty 2

## 2015-08-04 MED ORDER — METOPROLOL TARTRATE 12.5 MG HALF TABLET
12.5000 mg | ORAL_TABLET | Freq: Two times a day (BID) | ORAL | Status: DC
  Administered 2015-08-04 – 2015-08-06 (×4): 12.5 mg via ORAL
  Filled 2015-08-04 (×4): qty 1

## 2015-08-04 NOTE — Progress Notes (Signed)
Cortrak Tube Team Note: RN contacted Cortrak Tube Team for tube feeding tube insertion.  Once I arrived to room RN reports that pt is more alert and is willing to eat and drink supplements to avoid feeding tube. Plan per RN is to offer lunch and Glucerna Shakes and if pt eats will not place tube. RN will notify team if tube is needed.   [place], [place], [place] Pager [phone] After Hours Pager

## 2015-08-04 NOTE — Progress Notes (Signed)
[place] TEAM 1 - Stepdown/ICU TEAM PROGRESS NOTE  Cynthia Murray [id number]:[id number] DOB: May 31, 1971 DOA: 07/17/2015 PCP: Marcial Pacas, DO  Admit HPI / Brief Narrative: 45 yo F Hx tobacco abuse who was refered to [facility] from [facility] ER where she presented with a 5 day hx of cough, sob, weakness, pleuritic chest pain, headache, and chills. She was found to be hypotensive and hypoxic. CXR showed LLL PNA. She was started on Levophed and IV Abx /Zithromax . ABG showed pH7.39 / PCO2 27 / Po2 56. Lactate was elevated at 4.1 BNP elevated ~2700. Troponin neg. WBC elevated~17K .    Pt is a heavy smoker. CT chest 02/12/15 showed a 4.7 x 3.4 cm mass in LLL. According to office notes she was recommended to have repeat CT chest but declined due to financial reasons.   Significant Events / Procedures: 12/31 Transfer from [place], [place], Sepsis 12/31 CT head without contrast - negative acute findings 12/31 CT chest wo contrast - Lobar pneumonia of the left upper lobe with developing bronchopneumonia throughout the remaining portions of the lungs, presumably from endobronchial spread of infection 1/01 ARDS protocol 1/2 TTE - systolic function WNL - grade 1 DD - PA pressure: 47 mm Hg  1/10 patient removed her own Cortrak, replaced with OGT 1/11 Extubated 1/14 MRI brain - premature for age atrophy - likely chronic microvascular ischemic change 1/14 TRH assumed care  1/16 EEG - generalized irregular slow activity with no evidence of seizure activity  HPI/Subjective: The patient's mental status has improved significantly today.  She is conversant and can answer some simple questions.  At times her speech is stuttering and she loses her train of thought.  She complains of uncontrolled pain related to an headache and feeling weak in general.  She denies shortness of breath chest pain nausea vomiting or abdominal pain.  Assessment/Plan:  Septic shock w/ Acute Hypoxic Respiratory Failuredue to Severe CAP >  ARDS -Completed course of antibiotics - ARDS and shock resolved   Acute Encephalopathy / Delirium -back on home psychiatry medications -Brain MRI nondiagnostic -EEG unrevealing  -TSH 2.928 -complete metabolic w/u reveals elevated ammonia at 83 but is otherwise unremarkable  Elevated ammonia -significance clinically unclear - etiology unclear - LFTs have been normal - mental status is improving - hold on lactulose and recheck ammonia in AM   Hypernatremia  -most c/w DH due to poor intake - patient encouraged to increase oral intake and agrees to do so in order to avoid NG feeding tube   H/O Depression / Anxiety  -Continuing home med regimen   Deconditioning  -PT suggests CIR - consult to Rehab Team in place  Hypertension -not an active issue at this time    Pulmonary hypertension -PA pressure 66mHg on TTE - no clinical sx presently - hopefully will improve as lungs continue to heal following ARDS  Chronic pain syndrome -H/O Headaches & Chronic Back Pain -Patient is now complaining of severe uncontrolled pain - exercise extreme care and using pain medications given final improvements in mental status  Hypokalemia -Potassium at goal   Morbid Obesity - Body mass index is 41.53 kg/(m^2).  Code Status: FULL Family Communication: no family present at time of exam  Disposition Plan: SDU   Consultants: PCCM  Antibiotics: Vanc 12/31 > 1/2  Vanc 1/6 > 1/8 Zosyn 12/31 > 1/2 Ceftriaxone 1/2 > 1/6 Ceftaz 1/6 > 1/9  DVT prophylaxis: SQ heparin   Objective: Blood pressure 82/50, pulse 91, temperature 98.3 F (36.8 C),  temperature source Oral, resp. rate 30, height 5' 7"  (1.702 m), weight 120.3 kg (265 lb 3.4 oz), last menstrual period 07/31/2015, SpO2 98 %.  Intake/Output Summary (Last 24 hours) at 08/04/15 1614 Last data filed at 08/04/15 1400  Gross per 24 hour  Intake   2186 ml  Output   1200 ml  Net    986 ml   Exam: General: No acute respiratory distress -  alert  Lungs: Clear to auscultation bilaterally - no wheezes or crackles Cardiovascular: Regular rate and rhythm without murmur gallop or rub  Abdomen: Nontender, nondistended, soft, bowel sounds positive, no rebound, no ascites, no appreciable mass Extremities: No significant cyanosis, clubbing, edema bilateral lower extremities  Data Reviewed:  Basic Metabolic Panel:  Recent Labs Lab 07/29/15 0330 07/30/15 0528 07/31/15 1045 08/01/15 0425 08/02/15 0443 08/03/15 0445  NA 137 139 144 148* 152* 150*  K 4.0 3.0* 3.0* 3.3* 4.1 4.5  CL 100* 104 109 111 119* 116*  CO2 26 23 22 22  21* 23  GLUCOSE 116* 110* 146* 123* 116* 96  BUN 18 29* 28* 29* 28* 30*  CREATININE 0.48 0.64 0.62 0.62 0.70 0.77  CALCIUM 9.7 9.7 9.9 10.2 10.1 10.1  MG 2.2  --  2.2 2.3 2.5*  --   PHOS 4.3  --   --   --   --   --     CBC:  Recent Labs Lab 07/29/15 0330 07/31/15 1045 08/01/15 0425 08/02/15 0443 08/03/15 0445  WBC 15.6* 13.3* 13.5* 13.0* 14.3*  NEUTROABS  --  9.3* 9.0* 7.4  --   HGB 12.8 13.2 13.2 13.7 13.8  HCT 38.2 39.2 40.1 42.3 43.3  MCV 88.4 89.9 91.6 93.2 95.2  PLT 455* 529* 535* 549* 482*    Liver Function Tests:  Recent Labs Lab 07/31/15 1045 08/01/15 0425 08/02/15 0443 08/03/15 0445  AST 18 16 18  38  ALT 36 32 28 33  ALKPHOS 74 66 61 57  BILITOT 0.9 0.6 0.7 1.4*  PROT 8.1 7.7 8.3* 7.5  ALBUMIN 3.8 3.6 4.0 3.8     Recent Results (from the past 240 hour(s))  C difficile quick scan w PCR reflex     Status: None   Collection Time: 07/26/15  1:29 PM  Result Value Ref Range Status   C Diff antigen NEGATIVE NEGATIVE Final   C Diff toxin NEGATIVE NEGATIVE Final   C Diff interpretation Negative for toxigenic C. difficile  Final  Culture, Urine     Status: None   Collection Time: 07/31/15  4:45 PM  Result Value Ref Range Status   Specimen Description URINE, CATHETERIZED  Final   Special Requests NONE  Final   Culture NO GROWTH 2 DAYS  Final   Report Status 08/02/2015  FINAL  Final  Culture, blood (routine x 2)     Status: None (Preliminary result)   Collection Time: 07/31/15  6:40 PM  Result Value Ref Range Status   Specimen Description BLOOD RIGHT ANTECUBITAL  Final   Special Requests IN PEDIATRIC BOTTLE 1CC  Final   Culture NO GROWTH 4 DAYS  Final   Report Status PENDING  Incomplete  Culture, blood (routine x 2)     Status: None (Preliminary result)   Collection Time: 07/31/15  6:45 PM  Result Value Ref Range Status   Specimen Description BLOOD RIGHT HAND  Final   Special Requests IN PEDIATRIC BOTTLE 1CC  Final   Culture NO GROWTH 4 DAYS  Final   Report  Status PENDING  Incomplete     Studies:   Recent x-ray studies have been reviewed in detail by the Attending Physician  Scheduled Meds:  Scheduled Meds: . busPIRone  5 mg Oral BID  . famotidine  20 mg Oral BID  . feeding supplement (GLUCERNA SHAKE)  237 mL Oral BID BM  . FLUoxetine  20 mg Oral Daily  . heparin subcutaneous  5,000 Units Subcutaneous 3 times per day  . metoprolol tartrate  25 mg Oral BID  . potassium chloride  40 mEq Oral Daily  . QUEtiapine  12.5 mg Oral QHS    Time spent on care of this patient: 35 mins   MCCLUNG,JEFFREY T , MD   [facility] Office  [phone] Pager - Text Page per Shea Evans as per below:  On-Call/Text Page:      [email or web address]      password [email or web address]  If 7PM-7AM, please contact night-coverage [email or web address] Password [email or web address] 08/04/2015, 4:14 PM   LOS: 18 days

## 2015-08-04 NOTE — Progress Notes (Signed)
Physical Therapy Treatment Patient Details Name: Cynthia Murray MRN: [id number] DOB: Apr 17, 1971 Today's Date: 08/04/2015    History of Present Illness pt is a 45 y/o female with h/o HTN admitted from [facility] with cough fever, chills, SOB, pleuritic pain, HA.  CXR showed LLL PNA, ETT placed on arrival, 1/1   ARDS protocol initiated.     PT Comments    Cynthia Murray was oriented today but continues to present w/ slow processing, requiring multimodal cues and assist to achieve stand pivot to recliner chair today.  She expresses concern that she does not want her health to decline and appears more aware of her situation today.   Follow Up Recommendations  CIR     Equipment Recommendations  Other (comment) (TBD)    Recommendations for Other Services Rehab consult;OT consult     Precautions / Restrictions Precautions Precautions: Fall Precaution Comments: watch HR Restrictions Weight Bearing Restrictions: No    Mobility  Bed Mobility Overal bed mobility: +2 for physical assistance Bed Mobility: Supine to Sit     Supine to sit: HOB elevated;+2 for physical assistance;+2 for safety/equipment;Mod assist     General bed mobility comments: Pt achieves long sitting w/ increased time w/o physical assist, mod assist w/ use of bed pad to scoot to EOB.  Transfers Overall transfer level: Needs assistance Equipment used: Rolling walker (2 wheeled) Transfers: Sit to/from [facility] Sit to Stand: Min assist;+2 physical assistance;+2 safety/equipment Stand pivot transfers: Min assist;+2 safety/equipment;+2 physical assistance       General transfer comment: Multimodal cues for technique and cues to stand upright while standing and pivoting.  Cues to pick her feet up as she initially shuffles.  Assist directing RW to turn to sit in chair.  HR up to 137  Ambulation/Gait                 Stairs            Wheelchair Mobility    Modified Rankin (Stroke  Patients Only)       Balance Overall balance assessment: Needs assistance Sitting-balance support: Feet supported;Bilateral upper extremity supported Sitting balance-Leahy Scale: Fair     Standing balance support: Bilateral upper extremity supported;During functional activity Standing balance-Leahy Scale: Poor Standing balance comment: RW for support                    Cognition Arousal/Alertness: Awake/alert Behavior During Therapy: Anxious;Agitated Overall Cognitive Status: No family/caregiver present to determine baseline cognitive functioning Area of Impairment: Problem solving;Following commands;Attention;Safety/judgement;Awareness   Current Attention Level: Sustained Memory: Decreased short-term memory Following Commands: Follows one step commands with increased time Safety/Judgement: Decreased awareness of safety;Decreased awareness of deficits Awareness: Intellectual Problem Solving: Slow processing;Decreased initiation;Difficulty sequencing;Requires verbal cues;Requires tactile cues General Comments: Pt moving very slowly due to slow processing.  Requires assist as she appears perplexed by maniupulating the tissue to clean her glasses.    Exercises General Exercises - Lower Extremity Ankle Circles/Pumps: AROM;Both;10 reps;Seated    General Comments        Pertinent Vitals/Pain Pain Assessment: 0-10 Faces Pain Scale: No hurt    Home Living                      Prior Function            PT Goals (current goals can now be found in the care plan section) Acute Rehab PT Goals Patient Stated Goal: to not go downhill PT Goal Formulation: With  patient Time For Goal Achievement: 08/09/15 Potential to Achieve Goals: Good Progress towards PT goals: Progressing toward goals (very modestly)    Frequency  Min 3X/week    PT Plan Current plan remains appropriate    Co-evaluation             End of Session Equipment Utilized During  Treatment: Gait belt Activity Tolerance: Treatment limited secondary to agitation;Patient limited by fatigue;Other (comment) (slow processing) Patient left: in chair;with call bell/phone within reach;with chair alarm set     Time: 1610-9604 PT Time Calculation (min) (ACUTE ONLY): 29 min  Charges:  $Therapeutic Exercise: 8-22 mins $Therapeutic Activity: 8-22 mins                    G Codes:      Cynthia Murray PT, [place] [phone] Pager: [phone] 08/04/2015, 3:08 PM

## 2015-08-04 NOTE — Progress Notes (Signed)
Nutrition Follow-up  DOCUMENTATION CODES:   Morbid obesity  INTERVENTION:   Glucerna Shake po TID, each supplement provides 220 kcal and 10 grams of protein  Encouraged PO intake to avoid feeding tube.     If pt does require a feeding tube recommend: Initiate Glucerna 1.2 @ 20 ml/hr and increase by 10 ml every 8 hours to goal rate of 55 ml/hr.   30 ml Prostat TID.    Tube feeding regimen provides 1884 kcal, 124 grams of protein, and 1062 ml of H2O.   NUTRITION DIAGNOSIS:   Inadequate oral intake related to lethargy/confusion as evidenced by meal completion < 50%. Ongoing.   GOAL:   Patient will meet greater than or equal to 90% of their needs Not met.   MONITOR:   PO intake, Supplement acceptance, I & O's, Skin  ASSESSMENT:   45 yo WF smoker transferred to [facility] from [facility]. Being treated for septic shock, ARDS from pneumococcal lobar pneumonia.  Medications reviewed and include: KDur Labs reviewed: sodium elevated (150), Ammonia elevated (83)  Pt able to have a conversation with me but at times would focus on getting her comb to fix her hair. Pt stated she would eat and drink her Glucerna. Seemed sometimes confused stating that if she had known that she only needed to drink Glucerna she would have and wanted to know how many to drink, we discussed importance of eating at meals as well.  She also stated that she has been fat her whole life and thought she was doing herself a favor by not eating. We discussed importance of nutrition as well.   Pt was previously on TF but has been off TF since 1/11. Nutrition has been inadequate since that time.   Diet Order:  Diet regular Room service appropriate?: Yes; Fluid consistency:: Thin  Skin:  Wound (see comment) (MASD buttocks)  Last BM:  1/17  Height:   Ht Readings from Last 1 Encounters:  07/17/15 5' 7" (1.702 m)   Weight:   Wt Readings from Last 1 Encounters:  07/28/15 265 lb 3.4 oz (120.3 kg)   Ideal Body  Weight:  61.4 kg  BMI:  Body mass index is 41.53 kg/(m^2).  Estimated Nutritional Needs:   Kcal:  1800-2000  Protein:  110-120 gm  Fluid:  1.8-2 L  EDUCATION NEEDS:   No education needs identified at this time    RD, LDN, CNSC [phone] Pager [phone] After Hours Pager  

## 2015-08-05 LAB — COMPREHENSIVE METABOLIC PANEL
ALBUMIN: 3.5 g/dL (ref 3.5–5.0)
ALK PHOS: 48 U/L (ref 38–126)
ALT: 28 U/L (ref 14–54)
AST: 26 U/L (ref 15–41)
Anion gap: 7 (ref 5–15)
BUN: 15 mg/dL (ref 6–20)
CALCIUM: 9.6 mg/dL (ref 8.9–10.3)
CHLORIDE: 111 mmol/L (ref 101–111)
CO2: 24 mmol/L (ref 22–32)
CREATININE: 0.6 mg/dL (ref 0.44–1.00)
GFR calc non Af Amer: >60 mL/min (ref >60)
GLUCOSE: 99 mg/dL (ref 65–99)
Potassium: 4.7 mmol/L (ref 3.5–5.1)
SODIUM: 142 mmol/L (ref 135–145)
Total Bilirubin: 0.8 mg/dL (ref 0.3–1.2)
Total Protein: 6.6 g/dL (ref 6.5–8.1)

## 2015-08-05 LAB — CULTURE, BLOOD (ROUTINE X 2)
CULTURE: NO GROWTH
Culture: NO GROWTH

## 2015-08-05 LAB — CBC
HCT: 38.9 % (ref 36.0–46.0)
HEMOGLOBIN: 12.7 g/dL (ref 12.0–15.0)
MCH: 29.5 pg (ref 26.0–34.0)
MCHC: 32.6 g/dL (ref 30.0–36.0)
MCV: 90.3 fL (ref 78.0–100.0)
PLATELETS: 335 10*3/uL (ref 150–400)
RBC: 4.31 MIL/uL (ref 3.87–5.11)
RDW: 13.7 % (ref 11.5–15.5)
WBC: 11 10*3/uL — ABNORMAL HIGH (ref 4.0–10.5)

## 2015-08-05 LAB — AMMONIA: Ammonia: 28 umol/L (ref 9–35)

## 2015-08-05 NOTE — Progress Notes (Signed)
[place] TEAM 1 - Stepdown/ICU TEAM Progress Note  Halima Fogal IRC:[id number] DOB: [birth year] DOA: 07/17/2015 PCP: Marcial Pacas, DO  Admit HPI / Brief Narrative: 45 yo WF PMHx Tobacco abuse (current smoker),  Refered to [facility] from [facility] ER . She presented with 5 day hx of cough, sob , weakness , pleuritic chest pain, headache and chills. She was found to be hypotensive and hypoxic. CXR showed LLL PNA. She was started on Levophed and IV Abx /Zithromax . ABG showed pH7.39/PCO2 27 /Po2 at 56. Pt complains that her head is hurting bad. She feels sick all over . Has chronic back pain that is hurting worse. Lactate was elevated at 4.1 BNP elevated ~2700. Troponin neg. WBC elevated~17K . Pt says she has had pink tinged mucus mixed with green for last 5 days.  Pt is a heavy smoker . 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 She was treated with abx with clinical improvement . According to office notes review she was recommended to have repeat CT chest but declined due to financial reasons. She denies any vomiting or diarrhea.  No abdominal pain, urinary symptoms.   HPI/Subjective: 1/19 overnight afebrile, A/O 1 (does not know when, where, why), however did know she was in the hospital. Attempted to answer all questions appropriately, however when answering the wall would try to make excuses.. Follows all commands. No longer hallucinating. States that in chair between 2-5 hours .   Assessment/Plan: Acute Hypoxic Respiratory Failure:  -Secondary to ARDS from Very Severe CAP & pulmonary edema.  -Continuing pulmonary toilet.  Septic Shock/severe CAP:  -Shock has resolved.  -Completed course of antibiotics. -1/14 patient continues to have leukocytosis with fevers despite completing course of antibiotics. -blood culture, urine culture, PCXR; nondiagnostic  Leukocytosis -See septic shock  Acute Encephalopathy/Delirium/psychogenic?:  -Persisting and slowly improving.    -Patient restarted on home psychiatry medications -Brain MRI; nondiagnostic -EEG; negative seizure; neurology believes metabolic encephalopathy may clear eventually -TSH WNL -Neurology also believes may improve if we decrease dose of psych meds. Would wait until Saturday to decrease psychiatric medications further. This will have been ~1 week since last change    H/O Depression/Anxiety:  -Continue Buspar  5 mg  BID -Continue Prozac. 20 mg daily -Continue Seroquel 12.5 mg QHS  Deconditioning:  -Patient unable to cooperate with PT in order to complete evaluation .  Hypertension:  -BP soft medication on hold.  Pulmonary hypertension  Chronic pain syndrome/Headache/Chronic Back Pain  -Continue Fioricet which helped. Patient's headache last night  HypokalemiaNutrition  -Potassium goal> 4 -Discontinue potassium 40 mEq daily   Hypernatremia -Continue D5W 37m/hr  Diet:  -Recommended for a dysphagia 3 diet with nectar thickened. -Patient not eating close to the required caloric intake. CorTrak NG tube canceled, however appears patient still not consuming enough calories -Start Calorie count   Code Status: FULL Family Communication: Sister present at time of exam Disposition Plan: Resolution acute mental status change    Consultants: Dr.Jennings E NStage manager   Procedure/Significant Events: [place]31 [place]31 CT head without contrast; negative acute findings 12/31 CT chest wo contrast;Lobar pneumonia of the left upper lobe with developing bronchopneumonia throughout the remaining portions of the lungs, presumably from endobronchial spread of infection. 01/01 ARDS protocol 1/2 echocardiogram;Left ventricle: Systolic function WNL - (grade 1 diastolicdysfunction). - Pulmonary arteries: PA peak pressure: 47 mm Hg (S).1/3 Supine 1/10 Patient removed her own Cortrak, replaced with OGT 1/11 Extubated 1/14  MRI brain;-Premature for age  atrophy. - likely chronic microvascular ischemic change. 1/16 EEG; C/W  generalized non-specific cerebral dysfunction(encephalopathy). No seizure      Culture 12/31 Blood ([place]) >> NEG 12/31 Pneumococcal Ag >> POSITIVE 12/31 Legionella Ag >> NEG 12/31 Sputum >> NEG 01/01 HIV >> NEG 01/06 Sputum >> NEG 01/05 Blood >> NEG 01/09 C. Diff. PCR >> NEGATIVE 1/14 urine NGTD 1/14 blood right AC/hand NGTD   Antibiotics: Vanc 12/31 >>1/2  Vanc 1/6 (fever) >> 1/8 Zosyn 12/31 >>1/2 Ceftriaxone 1/2 >>> 1/6 Ceftaz 1/6 (fever) >>> 1/9   DVT prophylaxis: Subcutaneous heparin   Devices    LINES / TUBES:      Continuous Infusions: . dextrose 50 mL/hr at 08/04/15 2049    Objective: VITAL SIGNS: Temp: 98.8 F (37.1 C) (01/19 0815) Temp Source: Oral (01/19 0815) BP: 104/60 mmHg (01/19 0451) Pulse Rate: 98 (01/19 0451) SPO2; FIO2:   Intake/Output Summary (Last 24 hours) at 08/05/15 0904 Last data filed at 08/05/15 0820  Gross per 24 hour  Intake   2508 ml  Output   2450 ml  Net     58 ml     Exam: General: A/O 1 (does not know when, where, why), however did know she was in the hospital. No longer hallucinating. No acute respiratory distress Eyes: negative scleral hemorrhage ENT: Negative Runny nose, negative gingival bleeding, Neck:  Negative scars, masses, torticollis, lymphadenopathy, JVD Lungs: decreased breath sounds diffusely left lung fields, right lung field clear to auscultation, negative wheezes, negative crackles Cardiovascular: Regular rate and rhythm without murmur gallop or rub normal S1 and S2 Abdomen: Morbidly obese negative abdominal pain, nondistended, positive soft, bowel sounds, no rebound, no ascites, no appreciable mass Extremities: No significant cyanosis, clubbing, or edema bilateral lower extremities Psychiatric:  Negative depression, negative anxiety, negative fatigue, negative mania positive delusions Neurologic:  Would not participate  today, Positive dysarthria, positive expressive aphasia, positive receptive aphasia.   Data Reviewed: Basic Metabolic Panel:  Recent Labs Lab 07/31/15 1045 08/01/15 0425 08/02/15 0443 08/03/15 0445 08/05/15 0504  NA 144 148* 152* 150* 142  K 3.0* 3.3* 4.1 4.5 4.7  CL 109 111 119* 116* 111  CO2 22 22 21* 23 24  GLUCOSE 146* 123* 116* 96 99  BUN 28* 29* 28* 30* 15  CREATININE 0.62 0.62 0.70 0.77 0.60  CALCIUM 9.9 10.2 10.1 10.1 9.6  MG 2.2 2.3 2.5*  --   --    Liver Function Tests:  Recent Labs Lab 07/31/15 1045 08/01/15 0425 08/02/15 0443 08/03/15 0445 08/05/15 0504  AST 18 16 18  38 26  ALT 36 32 28 33 28  ALKPHOS 74 66 61 57 48  BILITOT 0.9 0.6 0.7 1.4* 0.8  PROT 8.1 7.7 8.3* 7.5 6.6  ALBUMIN 3.8 3.6 4.0 3.8 3.5   No results for input(s): LIPASE, AMYLASE in the last 168 hours.  Recent Labs Lab 08/03/15 0445 08/05/15 0504  AMMONIA 83* 28   CBC:  Recent Labs Lab 07/31/15 1045 08/01/15 0425 08/02/15 0443 08/03/15 0445 08/05/15 0504  WBC 13.3* 13.5* 13.0* 14.3* 11.0*  NEUTROABS 9.3* 9.0* 7.4  --   --   HGB 13.2 13.2 13.7 13.8 12.7  HCT 39.2 40.1 42.3 43.3 38.9  MCV 89.9 91.6 93.2 95.2 90.3  PLT 529* 535* 549* 482* 335   Cardiac Enzymes: No results for input(s): CKTOTAL, CKMB, CKMBINDEX, TROPONINI in the last 168 hours. BNP (last 3 results) No results for input(s): BNP in the last 8760 hours.  ProBNP (last 3 results) No results for input(s): PROBNP in the last 8760 hours.  CBG: No results for input(s): GLUCAP in the last 168 hours.  Recent Results (from the past 240 hour(s))  C difficile quick scan w PCR reflex     Status: None   Collection Time: 07/26/15  1:29 PM  Result Value Ref Range Status   C Diff antigen NEGATIVE NEGATIVE Final   C Diff toxin NEGATIVE NEGATIVE Final   C Diff interpretation Negative for toxigenic C. difficile  Final  Culture, Urine     Status: None   Collection Time: 07/31/15  4:45 PM  Result Value Ref Range Status    Specimen Description URINE, CATHETERIZED  Final   Special Requests NONE  Final   Culture NO GROWTH 2 DAYS  Final   Report Status 08/02/2015 FINAL  Final  Culture, blood (routine x 2)     Status: None (Preliminary result)   Collection Time: 07/31/15  6:40 PM  Result Value Ref Range Status   Specimen Description BLOOD RIGHT ANTECUBITAL  Final   Special Requests IN PEDIATRIC BOTTLE 1CC  Final   Culture NO GROWTH 4 DAYS  Final   Report Status PENDING  Incomplete  Culture, blood (routine x 2)     Status: None (Preliminary result)   Collection Time: 07/31/15  6:45 PM  Result Value Ref Range Status   Specimen Description BLOOD RIGHT HAND  Final   Special Requests IN PEDIATRIC BOTTLE 1CC  Final   Culture NO GROWTH 4 DAYS  Final   Report Status PENDING  Incomplete     Studies:  Recent x-ray studies have been reviewed in detail by the Attending Physician  Scheduled Meds:  Scheduled Meds: . busPIRone  5 mg Oral BID  . famotidine  20 mg Oral BID  . feeding supplement (GLUCERNA SHAKE)  237 mL Oral BID BM  . FLUoxetine  20 mg Oral Daily  . heparin subcutaneous  5,000 Units Subcutaneous 3 times per day  . metoprolol tartrate  12.5 mg Oral BID  . potassium chloride  40 mEq Oral Daily  . QUEtiapine  12.5 mg Oral QHS    Time spent on care of this patient: 40 mins   Oreoluwa Gilmer, Geraldo Docker , MD  [facility] Office  [phone] Pager [phone]  On-Call/Text Page:      [email or web address]      password [email or web address]  If 7PM-7AM, please contact night-coverage [email or web address] Password [email or web address] 08/05/2015, 9:04 AM   LOS: 19 days   Care during the described time interval was provided by me .  I have reviewed this patient's available data, including medical history, events of note, physical examination, and all test results as part of my evaluation. I have personally reviewed and interpreted all radiology studies.   Dia Crawford, MD [phone] Pager

## 2015-08-06 MED ORDER — SODIUM CHLORIDE 0.45 % IV SOLN
INTRAVENOUS | Status: DC
  Administered 2015-08-06 – 2015-08-07 (×3): via INTRAVENOUS

## 2015-08-06 MED ORDER — SODIUM CHLORIDE 0.9 % IV BOLUS (SEPSIS)
500.0000 mL | Freq: Once | INTRAVENOUS | Status: AC
  Administered 2015-08-06: 500 mL via INTRAVENOUS

## 2015-08-06 MED ORDER — BUTALBITAL-APAP-CAFFEINE 50-325-40 MG PO TABS
1.0000 | ORAL_TABLET | ORAL | Status: DC | PRN
  Administered 2015-08-06 – 2015-08-08 (×8): 2 via ORAL
  Filled 2015-08-06 (×8): qty 2

## 2015-08-06 MED ORDER — ACETAMINOPHEN 160 MG/5ML PO SOLN
500.0000 mg | Freq: Four times a day (QID) | ORAL | Status: DC | PRN
  Filled 2015-08-06: qty 20

## 2015-08-06 NOTE — Progress Notes (Signed)
PT Cancellation Note  Patient Details Name: Cynthia Murray MRN: [id number] DOB: [birth year]   Cancelled Treatment:    Reason Eval/Treat Not Completed: Patient declined, no reason specified.  Pt requests that she is given time to rest as she has been OOB already and had a fall today and is exhausted w/ a bad headache. PT will continue to follow acutely.  [name] PT, DPT [phone] Pager: [phone] 08/06/2015, 1:35 PM

## 2015-08-06 NOTE — Progress Notes (Signed)
At approximately 10:10 I was walking down the hall and saw and heard [place], [place], at the curtain to room 3S 12, asking for help.  As I approached, [place] stated she was helping the patient to the bedside commode and the patient slipped to the floor.  Upon entry I observed the patient sitting on the floor with her left leg doubled up and her left foot behind her.  Her heart rate at the time was 153, sinus tachycardia.  [facility] staff team assembled and we used the [employer] and pad to get her back to bed.  Pt stated she did not hit her head and had no pain associated with the descent to the floor.  Pt bathed, linens changed, and placed back in bed with the bed alarm on.

## 2015-08-06 NOTE — Progress Notes (Signed)
Rehab admissions - Awaiting medical readiness prior to potential inpatient rehab admission.  Call me for questions.  #[phone]

## 2015-08-06 NOTE — Progress Notes (Signed)
[place] TEAM 1 - Stepdown/ICU TEAM PROGRESS NOTE  Cynthia Murray GGY:[id number] DOB: 11-04-1970 DOA: 07/17/2015 PCP: Marcial Pacas, DO  Admit HPI / Brief Narrative: 45 yo F Hx tobacco abuse who was refered to [facility] from [facility] ER where she presented with a 5 day hx of cough, sob, weakness, pleuritic chest pain, headache, and chills. She was found to be hypotensive and hypoxic. CXR showed LLL PNA. She was started on Levophed and IV Abx /Zithromax . ABG showed pH7.39 / PCO2 27 / Po2 56. Lactate was elevated at 4.1 BNP elevated ~2700. Troponin neg. WBC elevated~17K .    Pt is a heavy smoker. CT chest 02/12/15 showed a 4.7 x 3.4 cm mass in LLL. According to office notes she was recommended to have repeat CT chest but declined due to financial reasons.   Significant Events / Procedures: 12/31 Transfer from [place], [place], Sepsis 12/31 CT head without contrast - negative acute findings 12/31 CT chest wo contrast - Lobar pneumonia of the left upper lobe with developing bronchopneumonia throughout the remaining portions of the lungs, presumably from endobronchial spread of infection 1/01 ARDS protocol 1/2 TTE - systolic function WNL - grade 1 DD - PA pressure: 47 mm Hg  1/10 patient removed her own Cortrak, replaced with OGT 1/11 Extubated 1/14 MRI brain - premature for age atrophy - likely chronic microvascular ischemic change 1/14 TRH assumed care  1/16 EEG - generalized irregular slow activity with no evidence of seizure activity  HPI/Subjective: It is noted the patient suffered a "fall" today.  At present she complains of a headache which is unrelated.  She denies shortness breath fevers chills nausea vomiting or abdominal pain.  She remains somewhat circumferential in her conversation but appears to be more alert and oriented in the last time I saw her 48 hours ago.  Assessment/Plan:  Septic shock w/ Acute Hypoxic Respiratory Failuredue to Severe CAP > ARDS -Completed course of  antibiotics - ARDS and shock resolved   Acute Encephalopathy / Delirium -now back on home psychiatry medications -Brain MRI nondiagnostic -EEG unrevealing  -TSH 2.928 -complete metabolic w/u reveals elevated ammonia at 83 but is otherwise unremarkable -improvement in mental status really developed after home psych meds were resumed, and QHS seroquel was added - will stop seroquel tonight and cont only usual home meds   Elevated ammonia -significance clinically unclear - etiology unclear - LFTs have been normal - mental status is improving - holding on lactulose - recheck ammonia normal    Hypernatremia  -most c/w DH due to poor intake - patient encouraged to increase oral intake and agrees to do so in order to avoid NG feeding tube - resolved   H/O Depression / Anxiety  -Continuing home med regimen   Deconditioning  -PT suggests CIR - consult to Rehab Team in place - anticipate medical readiness for CIR ~Monday  Hypotension in pt w/ hx Hypertension -appears to be due to simple DH - cont IVF and follow - hold BP meds   Pulmonary hypertension -PA pressure 60mHg on TTE - no clinical sx presently - hopefully will improve as lungs continue to heal following ARDS  Chronic pain syndrome -H/O Headaches & Chronic Back Pain -Patient is now complaining of severe uncontrolled pain - exercise extreme care and using pain medications given final improvements in mental status  Hypokalemia -Potassium remains at goal   Morbid Obesity - Body mass index is 41.53 kg/(m^2).  Code Status: FULL Family Communication: no family present at time  of exam  Disposition Plan: transfer to medical bed - cont PT/OT - follow MS and intake - possible CIR ?Monday   Consultants: PCCM  Antibiotics: Vanc 12/31 > 1/2  Vanc 1/6 > 1/8 Zosyn 12/31 > 1/2 Ceftriaxone 1/2 > 1/6 Ceftaz 1/6 > 1/9  DVT prophylaxis: SQ heparin   Objective: Blood pressure 88/68, pulse 108, temperature 97.6 F (36.4 C),  temperature source Oral, resp. rate 29, height 5' 7"  (1.702 m), weight 120.3 kg (265 lb 3.4 oz), last menstrual period 07/31/2015, SpO2 97 %.  Intake/Output Summary (Last 24 hours) at 08/06/15 1628 Last data filed at 08/06/15 1549  Gross per 24 hour  Intake 1489.17 ml  Output   1700 ml  Net -210.83 ml   Exam: General: No acute respiratory distress  Lungs: Clear to auscultation bilaterally - no wheezes/crackles Cardiovascular: Regular rate and rhythm without murmur or rub  Abdomen: Nontender, nondistended, soft, bowel sounds positive, no rebound, no ascites Extremities: No significant cyanosis, clubbing, or edema bilateral lower extremities  Data Reviewed:  Basic Metabolic Panel:  Recent Labs Lab 07/31/15 1045 08/01/15 0425 08/02/15 0443 08/03/15 0445 08/05/15 0504  NA 144 148* 152* 150* 142  K 3.0* 3.3* 4.1 4.5 4.7  CL 109 111 119* 116* 111  CO2 22 22 21* 23 24  GLUCOSE 146* 123* 116* 96 99  BUN 28* 29* 28* 30* 15  CREATININE 0.62 0.62 0.70 0.77 0.60  CALCIUM 9.9 10.2 10.1 10.1 9.6  MG 2.2 2.3 2.5*  --   --     CBC:  Recent Labs Lab 07/31/15 1045 08/01/15 0425 08/02/15 0443 08/03/15 0445 08/05/15 0504  WBC 13.3* 13.5* 13.0* 14.3* 11.0*  NEUTROABS 9.3* 9.0* 7.4  --   --   HGB 13.2 13.2 13.7 13.8 12.7  HCT 39.2 40.1 42.3 43.3 38.9  MCV 89.9 91.6 93.2 95.2 90.3  PLT 529* 535* 549* 482* 335    Liver Function Tests:  Recent Labs Lab 07/31/15 1045 08/01/15 0425 08/02/15 0443 08/03/15 0445 08/05/15 0504  AST 18 16 18  38 26  ALT 36 32 28 33 28  ALKPHOS 74 66 61 57 48  BILITOT 0.9 0.6 0.7 1.4* 0.8  PROT 8.1 7.7 8.3* 7.5 6.6  ALBUMIN 3.8 3.6 4.0 3.8 3.5     Recent Results (from the past 240 hour(s))  Culture, Urine     Status: None   Collection Time: 07/31/15  4:45 PM  Result Value Ref Range Status   Specimen Description URINE, CATHETERIZED  Final   Special Requests NONE  Final   Culture NO GROWTH 2 DAYS  Final   Report Status 08/02/2015 FINAL   Final  Culture, blood (routine x 2)     Status: None   Collection Time: 07/31/15  6:40 PM  Result Value Ref Range Status   Specimen Description BLOOD RIGHT ANTECUBITAL  Final   Special Requests IN PEDIATRIC BOTTLE 1CC  Final   Culture NO GROWTH 5 DAYS  Final   Report Status 08/05/2015 FINAL  Final  Culture, blood (routine x 2)     Status: None   Collection Time: 07/31/15  6:45 PM  Result Value Ref Range Status   Specimen Description BLOOD RIGHT HAND  Final   Special Requests IN PEDIATRIC BOTTLE 1CC  Final   Culture NO GROWTH 5 DAYS  Final   Report Status 08/05/2015 FINAL  Final     Studies:   Recent x-ray studies have been reviewed in detail by the Attending Physician  Scheduled  Meds:  Scheduled Meds: . busPIRone  5 mg Oral BID  . famotidine  20 mg Oral BID  . feeding supplement (GLUCERNA SHAKE)  237 mL Oral BID BM  . FLUoxetine  20 mg Oral Daily  . heparin subcutaneous  5,000 Units Subcutaneous 3 times per day  . metoprolol tartrate  12.5 mg Oral BID  . QUEtiapine  12.5 mg Oral QHS    Time spent on care of this patient: 35 mins   Migdalia Olejniczak T , MD   [facility] Office  [phone] Pager - Text Page per Shea Evans as per below:  On-Call/Text Page:      [email or web address]      password [email or web address]  If 7PM-7AM, please contact night-coverage [email or web address] Password [email or web address] 08/06/2015, 4:28 PM   LOS: 20 days

## 2015-08-06 NOTE — Progress Notes (Signed)
Calorie Count Note  48 hour calorie count ordered.  Per MD notes, pt continues to eat poorly. She refused Cortrak feeding tube on 08/04/15, due to willingness to eat and drink more. MD requesting calorie count due to poor po's.   Pt sleeping soundly at time of visit, unable to arouse. Lunch tray containing chicken salad sandwich on white bread, 3 slices of American Cheese, and 2 cans of Diet Coke remain untouched. Per meal completion records, pt consumed 100% of breakfast tray and was given 2 Glucerna supplements.   Diet: Regular Supplements: Glucerna Shake po BID, each supplement provides 220 kcal and 10 grams of protein  Breakfast: 100% meal completion (571 kcals and 17 grams protein) Lunch: 0% meal completion Dinner: n/a Supplements: Consumed 2 Glucerna Shakes (440 kcals and 20 grams protein)  Total intake: 1011 kcal (56% of minimum estimated needs)  37 protein (34% of minimum estimated needs)  Nutrition Dx: Inadequate oral intake related to lethargy/confusion as evidenced by meal completion < 50%; progressing  Goal: Patient will meet greater than or equal to 90% of their needs; progressing  Intervention:   -RD will follow-up with calorie count results on Monday, 08/09/15  -Increase Glucerna Shake to TID  -If pt does require a feeding tube recommend: Initiate Glucerna 1.2 @ 20 ml/hr and increase by 10 ml every 8 hours to goal rate of 55 ml/hr.   30 ml Prostat TID.   Tube feeding regimen provides 1884 kcal, 124 grams of protein, and 1062 ml of H2O.   Jarmaine Ehrler A. Jimmye Norman, RD, LDN, CDE Pager: [phone] After hours Pager: [phone]

## 2015-08-07 DIAGNOSIS — G9340 Encephalopathy, unspecified: Secondary | ICD-10-CM

## 2015-08-07 LAB — BASIC METABOLIC PANEL
ANION GAP: 8 (ref 5–15)
BUN: 10 mg/dL (ref 6–20)
CALCIUM: 9.3 mg/dL (ref 8.9–10.3)
CO2: 22 mmol/L (ref 22–32)
Chloride: 110 mmol/L (ref 101–111)
Creatinine, Ser: 0.45 mg/dL (ref 0.44–1.00)
GFR calc Af Amer: >60 mL/min (ref >60)
GLUCOSE: 95 mg/dL (ref 65–99)
POTASSIUM: 3.2 mmol/L — AB (ref 3.5–5.1)
SODIUM: 140 mmol/L (ref 135–145)

## 2015-08-07 LAB — AMMONIA: AMMONIA: 22 umol/L (ref 9–35)

## 2015-08-07 MED ORDER — MORPHINE SULFATE (PF) 2 MG/ML IV SOLN: 1.0000 mg | INTRAVENOUS | Status: DC | PRN

## 2015-08-07 NOTE — Progress Notes (Signed)
[facility]                                                                              Patient Demographics  Cynthia Murray, is a 45 y.o. female, DOB - [birth year], XTA:[id number]  Admit date - 07/17/2015   Admitting Physician [name], MD  Outpatient Primary MD for the patient is Marcial Pacas, DO  LOS - 21   No chief complaint on file.      Brief HPI   45 yo F Hx tobacco abuse who was refered to [facility] from [facility] ER where she presented with a 5 day hx of cough, sob, weakness, pleuritic chest pain, headache, and chills. She was found to be hypotensive and hypoxic. CXR showed LLL PNA. She was started on Levophed and IV Abx /Zithromax . ABG showed pH7.39 / PCO2 27 / Po2 56. Lactate was elevated at 4.1 BNP elevated ~2700. Troponin neg. WBC elevated~17K .   Pt is a heavy smoker. CT chest 02/12/15 showed a 4.7 x 3.4 cm mass in LLL. According to office notes she was recommended to have repeat CT chest but declined due to financial reasons.   Significant events 12/31 Transfer from [place], [place], Sepsis 12/31 CT head without contrast - negative acute findings 12/31 CT chest wo contrast - Lobar pneumonia of the left upper lobe with developing bronchopneumonia throughout the remaining portions of the lungs, presumably from endobronchial spread of infection 1/01 ARDS protocol 1/2 TTE - systolic function WNL - grade 1 DD - PA pressure: 47 mm Hg  1/10 patient removed her own Cortrak, replaced with OGT 1/11 Extubated 1/14 MRI brain - premature for age atrophy - likely chronic microvascular ischemic change 1/14 TRH assumed care  1/16 EEG - generalized irregular slow activity with no evidence of seizure activity   Assessment & Plan   Septic shock w/ Acute Hypoxic Respiratory Failuredue to Severe CAP > ARDS - Completed course of antibiotics, ARDS and shock resolved  - CT chest on 12/31 showed lobar pneumonia of the left upper lobe with developing bronchopneumonia  throughout the remaining portions of the lungs. The previously noted mass like opacity in the subcutaneous segment of the left lower lobe seen on prior study 7/29 has completely resolved.  Acute Encephalopathy / Delirium - Per RN staff, her mental status is actually better from the last few days. Patient is still having random thought process. -now back on home psychiatry medications -Brain MRI nondiagnostic -EEG unrevealing  -TSH 2.928 -complete metabolic w/u reveals elevated ammonia at 83 but is otherwise unremarkable -improvement in mental status really developed after home psych meds were resumed - Follow QTC, repeat EKG today showed improvement in QTC - Psych consult called for evaluation and medication management  Elevated ammonia -Improved today. LFTs normal. Unclear significance.  Hypernatremia  -Improving, likely due to poor intake - patient encouraged to increase oral intake and agrees to do so in order to avoid NG feeding tube - resolved   H/O Depression / Anxiety  -Continuing home med regimen   Deconditioning  -PT recommended CIR, consult placed, awaiting bed   Hypotension in pt w/ hx Hypertension -appears to  be due to simple DH, continue IVF  Pulmonary hypertension -PA pressure 33mHg on TTE - no clinical sx presently - hopefully will improve as lungs continue to heal following ARDS  Chronic pain syndrome -H/O chronic Headaches & Chronic Back Pain -Patient reports takes fioricet at home for headaches.  Hypokalemia -Replaced   Morbid Obesity - Body mass index is 41.53 kg/(m^2).  Code Status: Full code  Family Communication: Discussed in detail with the patient, all imaging results, lab results explained to the patient   Disposition Plan: Transfer to MedSurg floor  Time Spent in minutes 25 minutes  Procedures  CT chest  Consults   PC CM Psychiatry  DVT Prophylaxis  heparin subcutaneous  Medications  Scheduled Meds: . busPIRone  5 mg Oral BID   . famotidine  20 mg Oral BID  . feeding supplement (GLUCERNA SHAKE)  237 mL Oral BID BM  . FLUoxetine  20 mg Oral Daily  . heparin subcutaneous  5,000 Units Subcutaneous 3 times per day   Continuous Infusions: . sodium chloride 75 mL/hr at 08/07/15 0938   PRN Meds:.acetaminophen (TYLENOL) oral liquid 160 mg/5 mL, butalbital-acetaminophen-caffeine, Influenza vac split quadrivalent PF, morphine injection, pneumococcal 23 valent vaccine, RESOURCE THICKENUP CLEAR  Antibiotics: Vanc 12/31 > 1/2  Vanc 1/6 > 1/8 Zosyn 12/31 > 1/2 Ceftriaxone 1/2 > 1/6 Ceftaz 1/6 > 1/9   Antibiotics   Anti-infectives    Start     Dose/Rate Route Frequency Ordered Stop   07/23/15 1400  vancomycin (VANCOCIN) IVPB 1000 mg/200 mL premix  Status:  Discontinued     1,000 mg 200 mL/hr over 60 Minutes Intravenous Every 8 hours 07/23/15 0410 07/25/15 1109   07/23/15 0800  cefTAZidime (FORTAZ) 2 g in dextrose 5 % 50 mL IVPB  Status:  Discontinued     2 g 100 mL/hr over 30 Minutes Intravenous 3 times per day 07/23/15 0717 07/26/15 1140   07/23/15 0445  vancomycin (VANCOCIN) 2,000 mg in sodium chloride 0.9 % 500 mL IVPB     2,000 mg 250 mL/hr over 120 Minutes Intravenous  Once 07/23/15 0410 07/23/15 0645   07/21/15 1000  cefTRIAXone (ROCEPHIN) 2 g in dextrose 5 % 50 mL IVPB  Status:  Discontinued     2 g 100 mL/hr over 30 Minutes Intravenous Every 24 hours 07/20/15 1559 07/23/15 0712   07/20/15 1630  cefTRIAXone (ROCEPHIN) 1 g in dextrose 5 % 50 mL IVPB     1 g 100 mL/hr over 30 Minutes Intravenous  Once 07/20/15 1559 07/20/15 2015   07/19/15 1000  cefTRIAXone (ROCEPHIN) 1 g in dextrose 5 % 50 mL IVPB  Status:  Discontinued     1 g 100 mL/hr over 30 Minutes Intravenous Every 24 hours 07/19/15 0903 07/20/15 1559   07/18/15 1400  vancomycin (VANCOCIN) 1,500 mg in sodium chloride 0.9 % 500 mL IVPB  Status:  Discontinued     1,500 mg 250 mL/hr over 120 Minutes Intravenous Every 24 hours 07/17/15 1351 07/18/15  0902   07/18/15 1000  vancomycin (VANCOCIN) IVPB 1000 mg/200 mL premix  Status:  Discontinued     1,000 mg 200 mL/hr over 60 Minutes Intravenous Every 12 hours 07/18/15 0902 07/19/15 0903   07/17/15 1400  vancomycin (VANCOCIN) IVPB 750 mg/150 ml premix     750 mg 150 mL/hr over 60 Minutes Intravenous  Once 07/17/15 1350 07/17/15 1546   07/17/15 1400  piperacillin-tazobactam (ZOSYN) IVPB 3.375 g  Status:  Discontinued     3.375 g  12.5 mL/hr over 240 Minutes Intravenous 3 times per day 07/17/15 1352 07/19/15 1610        Subjective:   Cynthia Murray was seen and examined today. Complaining of headache. Patient denies dizziness, chest pain, shortness of breath, abdominal pain, N/V/D/C, new weakness, numbess, tingling. No acute events overnight.    Objective:   Blood pressure 109/75, pulse 109, temperature 97.8 F (36.6 C), temperature source Oral, resp. rate 14, height 5' 7"  (1.702 m), weight 120.3 kg (265 lb 3.4 oz), last menstrual period 07/31/2015, SpO2 99 %.  Wt Readings from Last 3 Encounters:  07/28/15 120.3 kg (265 lb 3.4 oz)  06/22/15 119.296 kg (263 lb)  04/05/15 119.296 kg (263 lb)     Intake/Output Summary (Last 24 hours) at 08/07/15 1053 Last data filed at 08/07/15 1028  Gross per 24 hour  Intake 1519.17 ml  Output   2775 ml  Net -1255.83 ml    Exam  General: Alert and oriented x 3, NAD, tangential conversations  HEENT:  PERRLA, EOMI, Anicteric Sclera, mucous membranes moist.   Neck: Supple, no JVD, no masses  CVS: S1 S2 auscultated, no rubs, murmurs or gallops. Regular rate and rhythm.  Respiratory: Clear to auscultation bilaterally, no wheezing, rales or rhonchi  Abdomen: Soft, nontender, nondistended, + bowel sounds  Ext: no cyanosis clubbing or edema  Neuro: AAOx3, Cr N's II- XII. Strength 5/5 upper and lower extremities bilaterally  Skin: No rashes  Psych: Normal affect and demeanor, alert and oriented x3    Data Review   Micro  Results Recent Results (from the past 240 hour(s))  Culture, Urine     Status: None   Collection Time: 07/31/15  4:45 PM  Result Value Ref Range Status   Specimen Description URINE, CATHETERIZED  Final   Special Requests NONE  Final   Culture NO GROWTH 2 DAYS  Final   Report Status 08/02/2015 FINAL  Final  Culture, blood (routine x 2)     Status: None   Collection Time: 07/31/15  6:40 PM  Result Value Ref Range Status   Specimen Description BLOOD RIGHT ANTECUBITAL  Final   Special Requests IN PEDIATRIC BOTTLE [place]  Final   Culture NO GROWTH 5 DAYS  Final   Report Status 08/05/2015 FINAL  Final  Culture, blood (routine x 2)     Status: None   Collection Time: 07/31/15  6:45 PM  Result Value Ref Range Status   Specimen Description BLOOD RIGHT HAND  Final   Special Requests IN PEDIATRIC BOTTLE [place]  Final   Culture NO GROWTH 5 DAYS  Final   Report Status 08/05/2015 FINAL  Final    Radiology Reports Ct Head Wo Contrast  07/17/2015  CLINICAL DATA:  Headache for the last 5 days. Pneumonia. Left-sided lung mass. EXAM: CT HEAD WITHOUT CONTRAST TECHNIQUE: Contiguous axial images were obtained from the base of the skull through the vertex without intravenous contrast. COMPARISON:  None. FINDINGS: Sinuses/Soft tissues: Clear paranasal sinuses and mastoid air cells. Intracranial: No mass lesion, hemorrhage, hydrocephalus, acute infarct, intra-axial, or extra-axial fluid collection. IMPRESSION: Normal head CT. Electronically Signed   By: Abigail Miyamoto M.D.   On: 07/17/2015 16:13   Ct Chest Wo Contrast  07/17/2015  CLINICAL DATA:  45 year old female with headache and weakness for the past 5 days. Worsening shortness of breath. EXAM: CT CHEST WITHOUT CONTRAST TECHNIQUE: Multidetector CT imaging of the chest was performed following the standard protocol without IV contrast. COMPARISON:  Chest CT 02/12/2015. FINDINGS:  Mediastinum/Lymph Nodes: Heart size is normal. There is no significant pericardial  fluid, thickening or pericardial calcification. No pathologically enlarged mediastinal or hilar lymph nodes. Please note that accurate exclusion of hilar adenopathy is limited on noncontrast CT scans. Esophagus is unremarkable in appearance. No axillary lymphadenopathy. Lungs/Pleura: The previously noted mass-like opacity in the superior segment of the left lower lobe seen on prior study 02/12/2015 has completely resolved. However, there is now near complete lobar consolidation throughout the left upper lobe, and widespread peribronchovascular ground-glass attenuation, consolidation thickening of the peribronchovascular interstitium throughout the remainder of the lungs bilaterally, indicative of early endobronchial spread of infection. No pleural effusions. Surgical clips in the infrahilar aspect of the medial right lower lobe. Upper Abdomen: Status post cholecystectomy. Musculoskeletal/Soft Tissues: There are no aggressive appearing lytic or blastic lesions noted in the visualized portions of the skeleton. IMPRESSION: 1. Lobar pneumonia of the left upper lobe with developing bronchopneumonia throughout the remaining portions of the lungs, presumably from endobronchial spread of infection. 2. Additional incidental findings, as above. Electronically Signed   By: Vinnie Langton M.D.   On: 07/17/2015 16:34   Mr Brain Wo Contrast  07/31/2015  CLINICAL DATA:  Septic shock. ARDS. Lobar pneumonia. Altered mentation with delirium. EXAM: MRI HEAD WITHOUT CONTRAST TECHNIQUE: Multiplanar, multiecho pulse sequences of the brain and surrounding structures were obtained without intravenous contrast. COMPARISON:  CT head 07/17/2015. FINDINGS: No evidence for acute infarction, hemorrhage, mass lesion, hydrocephalus, or extra-axial fluid. Premature for age atrophy. Mild subcortical and periventricular T2 and FLAIR hyperintensities, likely chronic microvascular ischemic change. Pituitary, pineal, and cerebellar tonsils  unremarkable. No upper cervical lesions. Flow voids are maintained throughout the carotid, basilar, and vertebral arteries. There are no areas of chronic hemorrhage. Visualized calvarium, skull base, and upper cervical osseous structures unremarkable. Scalp and extracranial soft tissues, orbits, sinuses, and mastoids show no acute process. IMPRESSION: Premature for age atrophy. Mild, but abnormal, white matter signal abnormality, likely chronic microvascular ischemic change. No acute intracranial findings. Electronically Signed   By: Staci Righter M.D.   On: 07/31/2015 17:49   Dg Chest Port 1 View  07/31/2015  CLINICAL DATA:  Pneumonia EXAM: PORTABLE CHEST 1 VIEW COMPARISON:  Radiograph 07/28/2015 FINDINGS: Interval extubation. Normal cardiac silhouette. Low lung volumes. Improvement in vascular congestion compared to prior. IMPRESSION: Low lung volumes and mild vascular congestion improved from prior. Electronically Signed   By: Suzy Bouchard M.D.   On: 07/31/2015 17:22   Dg Chest Port 1 View  07/28/2015  CLINICAL DATA:  Respiratory failure, pneumonia, sepsis, ARDS. EXAM: PORTABLE CHEST 1 VIEW COMPARISON:  Portable chest x-ray of July 27, 2015 FINDINGS: The lungs are slightly better inflated today. Aeration of the left upper lung has improved. There is persistent interstitial density at the right lung base. On the left the mildly increased airspace densities persists. There remains partial obscuration of the left hemidiaphragm. A left pleural effusion cannot be excluded. The cardiac silhouette is top-normal in size. The pulmonary vascularity is not engorged. The endotracheal tube tip lies 5.6 cm above the carina. The esophagogastric tube tip projects below the inferior margin of the image. IMPRESSION: Persistent bibasilar and left perihilar atelectasis or pneumonia. There may be a left pleural effusion layering posteriorly. There is no significant pulmonary edema. Electronically Signed   By: David   Martinique M.D.   On: 07/28/2015 08:20   Dg Chest Port 1 View  07/27/2015  CLINICAL DATA:  Evaluate pulmonary edema EXAM: PORTABLE CHEST 1 VIEW COMPARISON:  07/26/2015  FINDINGS: No unchanged endotracheal tube or orogastric tube. Discoid atelectasis right lung base similar to prior study. Hazy opacity over the left thorax unchanged suggesting a combination of pleural effusion and alveolar opacity. Mildly decreased vascular congestion. Decreased interstitial prominence. IMPRESSION: Improving pulmonary edema with persistent hazy density left thorax suggesting pleural effusion. Electronically Signed   By: Skipper Cliche M.D.   On: 07/27/2015 09:05   Dg Chest Port 1 View  07/26/2015  CLINICAL DATA:  Respiratory failure. EXAM: PORTABLE CHEST 1 VIEW COMPARISON:  07/25/2015. FINDINGS: Endotracheal tube and feeding tube in stable position. Mediastinum and hilar structures are stable . Cardiomegaly with pulmonary venous congestion and bilateral pulmonary infiltrates again noted suggesting congestive heart failure. Bilateral pneumonia cannot be excluded. Low lung volumes with basilar atelectasis. Small bilateral pleural effusions cannot be excluded. No pneumothorax. IMPRESSION: 1. Lines and tubes stable position. 2. Cardiomegaly with pulmonary vascular prominence and bilateral pulmonary infiltrates. Small bilateral effusions. Findings consistent with congestive heart failure. No interim change. 3. Low lung volumes with basilar atelectasis. Electronically Signed   By: Marcello Moores  Register   On: 07/26/2015 07:34   Dg Chest Port 1 View  07/25/2015  CLINICAL DATA:  Respiratory failure, pneumonia, sepsis, ARDS EXAM: PORTABLE CHEST 1 VIEW COMPARISON:  Chest x-rays dated 07/23/2015 and 07/22/2015. FINDINGS: Endotracheal tube remains well positioned with tip approximately 3 cm above the level of the carina. Enteric tube passes below the diaphragm. Cardiomegaly is stable. There is persistent central pulmonary vascular congestion and  mild bilateral interstitial edema, not significantly changed. Suspect additional atelectasis at each lung base, possible small left pleural effusion. No pneumothorax IMPRESSION: No significant interval change. Persistent central pulmonary vascular congestion and mild bilateral interstitial edema, suggesting mild volume overload/CHF. Suspect additional atelectasis at each lung base and small left pleural effusion. Electronically Signed   By: Franki Cabot M.D.   On: 07/25/2015 08:26   Dg Chest Port 1 View  07/23/2015  CLINICAL DATA:  Respiratory failure, pneumonia, sepsis, ARDS. EXAM: PORTABLE CHEST 1 VIEW COMPARISON:  Portable chest x-ray of July 22, 2015 FINDINGS: The lungs are slightly better inflated today. There remain coarse interstitial and alveolar opacities but there has been overall improvement. The cardiac silhouette is top-normal in size but the margins are obscured. The pulmonary vascularity is engorged but slightly more distinct. There is no large pleural effusion or nor evidence of a pneumothorax. The endotracheal tube tip lies 4.2 cm above the carina. The feeding tube tip projects below the inferior margin of the image. The left internal jugular venous catheter tip projects over the proximal SVC. IMPRESSION: Slight interval improvement in pulmonary interstitial and alveolar edema but CHF remains. The support tubes are in reasonable position. Electronically Signed   By: David  Martinique M.D.   On: 07/23/2015 07:46   Dg Chest Port 1 View  07/22/2015  CLINICAL DATA:  Respiratory failure.  Shortness breath. EXAM: PORTABLE CHEST - 1 VIEW COMPARISON:  One-view chest x-ray 07/21/2015 FINDINGS: The heart is enlarged. Diffuse edema is not significantly changed. Left pleural effusion is suspected. The endotracheal tube is stable at 2.1 cm above the carina. A left IJ line is in place. A feeding tube courses off the inferior border of the film. The lung volumes remain low. IMPRESSION: 1. Similar appearance of  low lung volumes and diffuse interstitial and airspace disease. This may represent edema, pneumonia, or ARDS. 2. Support apparatus is stable. Electronically Signed   By: San Morelle M.D.   On: 07/22/2015 06:55   Dg  Chest Port 1 View  07/21/2015  CLINICAL DATA:  ARDS EXAM: PORTABLE CHEST 1 VIEW COMPARISON:  Portable chest x-ray of July 20, 2015 FINDINGS: The lungs remain hypoinflated. Confluent alveolar opacity on the left has markedly improved. There has also been some improvement on the right. The interstitial markings remain increased. There is no large pleural effusion and no pneumothorax. The visualized portions of the cardiac silhouette are normal in size. The endotracheal tube tip lies 2.6 cm above the carina. The Doppler pop type feeding tube tip projects below the inferior margin of the image. The left internal jugular venous catheter tip projects at the junction of the right and left brachiocephalic veins. IMPRESSION: Interval decrease in alveolar opacities consistent with resolving edema, pneumonia, or ARDS. Electronically Signed   By: David  Martinique M.D.   On: 07/21/2015 07:31   Dg Chest Port 1 View  07/20/2015  CLINICAL DATA:  Pneumonia, sepsis, acute respiratory failure EXAM: PORTABLE CHEST 1 VIEW COMPARISON:  Portable chest x-ray of July 19, 2015 FINDINGS: A large amount of soft tissue projects over both lungs. However, confluent alveolar opacities have worsened on the left. They are also more conspicuous at the right lung base. The cardiac silhouette is obscured. The endotracheal tube tip projects approximately 6 cm above the carina. The left internal jugular venous catheter tip projects over the junction of the right and left brachiocephalic veins. The feeding tube tip projects below the inferior margin of the image. IMPRESSION: Progressive alveolar opacities throughout the left lung and at the right lung base consistent with worsening pneumonia or pulmonary edema. The patient's prone  position may be contributing to the findings. The support tubes are in reasonable position. Electronically Signed   By: David  Martinique M.D.   On: 07/20/2015 07:22   Dg Chest Port 1 View  07/19/2015  CLINICAL DATA:  45 year old female with pneumococcal pneumonia. Subsequent encounter. EXAM: PORTABLE CHEST 1 VIEW COMPARISON:  07/18/2015 FINDINGS: Endotracheal tube tip 4.2 cm above the carina. Left central line tip projects at the level of the proximal superior vena cava. No gross pneumothorax. Nasogastric tube courses below the diaphragm. Tip is not included on the present exam. Prominent asymmetric airspace disease greater on the left without significant change (taking into account slight differences in technique and position). Cardiomegaly. IMPRESSION: Diffuse asymmetric airspace disease greater on left without significant change. Electronically Signed   By: Genia Del M.D.   On: 07/19/2015 07:48   Dg Chest Port 1 View  07/18/2015  CLINICAL DATA:  45 year old female with central line placement and pneumonia. EXAM: PORTABLE CHEST 1 VIEW COMPARISON:  Radiograph dated 07/07/2015 FINDINGS: Endotracheal tube remains above the carina enteric tube courses towards the left hemiabdomen. There has been interval placement of the left IJ central line with tip over central SVC. Bilateral diffuse airspace opacities, left greater right again noted. No pneumothorax. Stable cardiac silhouette. The osseous structures appear unremarkable. IMPRESSION: Interval placement of a left IJ central line with tip over central SVC. No pneumothorax. Diffuse bilateral airspace opacities. Electronically Signed   By: Anner Crete M.D.   On: 07/18/2015 04:37   Dg Chest Port 1 View  07/17/2015  CLINICAL DATA:  Patient status post ET tube placement. EXAM: PORTABLE CHEST 1 VIEW COMPARISON:  Chest CT earlier same day FINDINGS: ET tube terminates in the distal trachea. Enteric tube courses inferior to the diaphragm. Obscured cardiac and  mediastinal contours. Diffuse left-greater-than-right pulmonary airspace opacities. No large pleural effusion or pneumothorax. IMPRESSION: ET tube terminates in  the distal trachea, recommend retraction. Left-greater-than-right airspace opacities compatible with extensive pneumonia. These results were called by telephone at the time of interpretation on 07/17/2015 at 6:59 pm to Rose Phi, RN, who verbally acknowledged these results. Electronically Signed   By: Lovey Newcomer M.D.   On: 07/17/2015 19:01   Dg Abd Portable 1v  07/27/2015  CLINICAL DATA:  Encounter for orogastric tube placement. EXAM: PORTABLE ABDOMEN - 1 VIEW COMPARISON:  Abdominal radiograph 07/19/2015 FINDINGS: Tip and side port of the enteric tube below the diaphragm in the stomach, the tip is directed towards the fundus. No dilated bowel loops in the upper abdomen. Air seen within the colon. IMPRESSION: Tip and side port of the enteric tube below the diaphragm in the stomach, tip directed towards the fundus. Electronically Signed   By: Jeb Levering M.D.   On: 07/27/2015 19:22   Dg Abd Portable 1v  07/19/2015  CLINICAL DATA:  Feeding tube placement EXAM: PORTABLE ABDOMEN - 1 VIEW COMPARISON:  None. FINDINGS: 1221 hours. The tip of the feeding tube is identified in the descending portion of the duodenum. Bowel gas pattern is normal. IMPRESSION: Feeding tube tip is transpyloric. Electronically Signed   By: Misty Stanley M.D.   On: 07/19/2015 12:29    CBC  Recent Labs Lab 08/01/15 0425 08/02/15 0443 08/03/15 0445 08/05/15 0504  WBC 13.5* 13.0* 14.3* 11.0*  HGB 13.2 13.7 13.8 12.7  HCT 40.1 42.3 43.3 38.9  PLT 535* 549* 482* 335  MCV 91.6 93.2 95.2 90.3  MCH 30.1 30.2 30.3 29.5  MCHC 32.9 32.4 31.9 32.6  RDW 14.6 15.1 15.1 13.7  LYMPHSABS 3.3 4.6*  --   --   MONOABS 1.1* 0.9  --   --   EOSABS 0.0 0.0  --   --   BASOSABS 0.0 0.1  --   --     Chemistries   Recent Labs Lab 08/01/15 0425 08/02/15 0443 08/03/15 0445  08/05/15 0504 08/07/15 0623  NA 148* 152* 150* 142 140  K 3.3* 4.1 4.5 4.7 3.2*  CL 111 119* 116* 111 110  CO2 22 21* 23 24 22   GLUCOSE 123* 116* 96 99 95  BUN 29* 28* 30* 15 10  CREATININE 0.62 0.70 0.77 0.60 0.45  CALCIUM 10.2 10.1 10.1 9.6 9.3  MG 2.3 2.5*  --   --   --   AST 16 18 38 26  --   ALT 32 28 33 28  --   ALKPHOS 66 61 57 48  --   BILITOT 0.6 0.7 1.4* 0.8  --    ------------------------------------------------------------------------------------------------------------------ estimated creatinine clearance is 123.1 mL/min (by C-G formula based on Cr of 0.45). ------------------------------------------------------------------------------------------------------------------ No results for input(s): HGBA1C in the last 72 hours. ------------------------------------------------------------------------------------------------------------------ No results for input(s): CHOL, HDL, LDLCALC, TRIG, CHOLHDL, LDLDIRECT in the last 72 hours. ------------------------------------------------------------------------------------------------------------------ No results for input(s): TSH, T4TOTAL, T3FREE, THYROIDAB in the last 72 hours.  Invalid input(s): FREET3 ------------------------------------------------------------------------------------------------------------------ No results for input(s): VITAMINB12, FOLATE, FERRITIN, TIBC, IRON, RETICCTPCT in the last 72 hours.  Coagulation profile No results for input(s): INR, PROTIME in the last 168 hours.  No results for input(s): DDIMER in the last 72 hours.  Cardiac Enzymes No results for input(s): CKMB, TROPONINI, MYOGLOBIN in the last 168 hours.  Invalid input(s): CK ------------------------------------------------------------------------------------------------------------------ Invalid input(s): POCBNP  No results for input(s): GLUCAP in the last 72 hours.   Mechille Varghese M.D. [facility] 08/07/2015, 10:53 AM  Pager:  [phone] Between 7am to 7pm - call  Pager - [phone]  After 7pm go to [email or web address] - password [email or web address]  Call night coverage person covering after 7pm

## 2015-08-08 DIAGNOSIS — F339 Major depressive disorder, recurrent, unspecified: Secondary | ICD-10-CM | POA: Diagnosis present

## 2015-08-08 DIAGNOSIS — F410 Panic disorder [episodic paroxysmal anxiety] without agoraphobia: Secondary | ICD-10-CM

## 2015-08-08 DIAGNOSIS — F330 Major depressive disorder, recurrent, mild: Secondary | ICD-10-CM

## 2015-08-08 DIAGNOSIS — F411 Generalized anxiety disorder: Secondary | ICD-10-CM

## 2015-08-08 LAB — CBC
HCT: 38.6 % (ref 36.0–46.0)
HEMOGLOBIN: 12.8 g/dL (ref 12.0–15.0)
MCH: 29.8 pg (ref 26.0–34.0)
MCHC: 33.2 g/dL (ref 30.0–36.0)
MCV: 90 fL (ref 78.0–100.0)
PLATELETS: 218 10*3/uL (ref 150–400)
RBC: 4.29 MIL/uL (ref 3.87–5.11)
RDW: 14.3 % (ref 11.5–15.5)
WBC: 10.9 10*3/uL — ABNORMAL HIGH (ref 4.0–10.5)

## 2015-08-08 LAB — BASIC METABOLIC PANEL
ANION GAP: 12 (ref 5–15)
BUN: 6 mg/dL (ref 6–20)
CALCIUM: 9.7 mg/dL (ref 8.9–10.3)
CO2: 22 mmol/L (ref 22–32)
CREATININE: 0.62 mg/dL (ref 0.44–1.00)
Chloride: 107 mmol/L (ref 101–111)
GFR calc Af Amer: >60 mL/min (ref >60)
GLUCOSE: 110 mg/dL — AB (ref 65–99)
Potassium: 3.2 mmol/L — ABNORMAL LOW (ref 3.5–5.1)
Sodium: 141 mmol/L (ref 135–145)

## 2015-08-08 LAB — GLUCOSE, CAPILLARY: GLUCOSE-CAPILLARY: 274 mg/dL — AB (ref 65–99)

## 2015-08-08 MED ORDER — TRAZODONE HCL 100 MG PO TABS
100.0000 mg | ORAL_TABLET | Freq: Every evening | ORAL | Status: DC | PRN
  Administered 2015-08-08: 100 mg via ORAL
  Filled 2015-08-08: qty 1

## 2015-08-08 MED ORDER — FLUOXETINE HCL 20 MG/5ML PO SOLN: 40.0000 mg | Freq: Every day | ORAL | Status: DC

## 2015-08-08 MED ORDER — FLUOXETINE HCL 20 MG/5ML PO SOLN
20.0000 mg | Freq: Every day | ORAL | Status: DC
  Administered 2015-08-09: 20 mg via ORAL
  Filled 2015-08-08: qty 5

## 2015-08-08 MED ORDER — CLONAZEPAM 0.5 MG PO TABS
0.5000 mg | ORAL_TABLET | Freq: Three times a day (TID) | ORAL | Status: DC | PRN
  Administered 2015-08-08 – 2015-08-09 (×2): 0.5 mg via ORAL
  Filled 2015-08-08 (×2): qty 1

## 2015-08-08 MED ORDER — CLONAZEPAM 1 MG PO TABS
1.0000 mg | ORAL_TABLET | Freq: Three times a day (TID) | ORAL | Status: DC | PRN
  Administered 2015-08-08: 1 mg via ORAL
  Filled 2015-08-08: qty 1

## 2015-08-08 MED ORDER — POTASSIUM CHLORIDE CRYS ER 20 MEQ PO TBCR
40.0000 meq | EXTENDED_RELEASE_TABLET | Freq: Once | ORAL | Status: AC
  Administered 2015-08-08: 40 meq via ORAL
  Filled 2015-08-08: qty 2

## 2015-08-08 MED ORDER — BUSPIRONE HCL 10 MG PO TABS
10.0000 mg | ORAL_TABLET | Freq: Two times a day (BID) | ORAL | Status: DC
  Administered 2015-08-08 – 2015-08-09 (×2): 10 mg via ORAL
  Filled 2015-08-08 (×4): qty 1

## 2015-08-08 NOTE — Consult Note (Signed)
[facility] Consult   Reason for Consult: medication management of anxiety, depression and panic disorder Referring Physician:  Dr. Tana Coast Patient Identification: Cynthia Murray MRN:  [id number] Principal Diagnosis: Generalized anxiety disorder Diagnosis:   Patient Active Problem List   Diagnosis Date Noted  . Generalized anxiety disorder [F41.1] 08/08/2015    Priority: High  . Panic disorder without agoraphobia [F41.0] 08/08/2015    Priority: High  . Major depressive disorder, recurrent episode ([place]) [F33.9] 08/08/2015    Priority: High  . Acute encephalopathy [G93.40]   . Acute delirium [R41.0]   . Anxiety state [F41.1]   . Essential hypertension [I10]   . Hypokalemia [E87.6]   . Septic shock ([place]) [A41.9, R65.21]   . CAP (community acquired pneumonia) [J18.9]   . Pulmonary hypertension ([place]) [I27.2]   . Chronic pain syndrome [G89.4]   . 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 Pneumococcal lobar pneumonia ([place]) [J13]   . ARDS (adult respiratory distress syndrome) ([facility]) [J80]   . Encounter for tube feeding instruction [Z71.89]   . Pneumonia [J18.9] 07/17/2015  . Sepsis ([place]) [A41.9] 07/17/2015  . AKI (acute kidney injury) ([place]) [N17.9] 07/17/2015  . Headache [R51]   . Endotracheally intubated [Z78.9]   . Lobar pneumonia ([place]) [J18.1]   . Acute respiratory failure with hypoxia ([place]) [J96.01]   . Lung mass [R91.8] 02/11/2015  . Right ankle pain [M25.571] 05/14/2014  . Left lumbar radiculopathy [M54.16] 05/14/2014  . Essential hypertension, benign [I10] 12/11/2013  . Chronic pain [G89.29] 12/11/2013  . Anxiety [F41.9] 12/10/2013  . Depression [F32.9] 12/10/2013  . Migraine [G43.909] 12/10/2013    Total Time spent with patient: 1 hour  Subjective:   Cynthia Murray is a 45 y.o. female patient admitted with severe pneumonia.  HPI: Thanks for asking me to do a psychiatric consultation on Cynthia Murray for medication management of anxiety, depression and panic  disorder. Patient reports that she was admitted for "double pneumonia" which was serious enough she became forgetful of events in her life after November, 2016. However, today, she is alert and oriented  and was able to give the sequence of events for the past few days. Patient reports that she was diagnosed with Anxiety, panic disorder and depression for which she was receiving Buspar, Prozac and Clonazepam. She reports increased anxiety, apprehensions, panic attacks and depressive symptoms due to not receiving her medications for few days when she was in the ICU. She denies psychosis, delusional thinking and premorbid alcohol and drug abuse. She is willing to put on low dose of Clonazepam until she sees her outpatient provider, I shared my concerned of untoward adverse reactions of taking Clonazepam for a long time and she demonstrated understanding.  Past Psychiatric History: as in the history  Risk to Self: Is patient at risk for suicide?: No Risk to Others:   Prior Inpatient Therapy:   Prior Outpatient Therapy:    Past Medical History:  Past Medical History  Diagnosis Date  . Hypertension     Past Surgical History  Procedure Laterality Date  . Hand surgery  1994    left hand  . Removal of cyst between lungs  1998  . Appendectomy  2004  . Cholecystectomy  2004   Family History:  Family History  Problem Relation Age of Onset  . Breast cancer      grandmother  . Heart attack Father   . Depression Mother     father  . Diabetes Father   . Hyperlipidemia Father   .  Hypertension Father    Family Psychiatric  History: Social History:  History  Alcohol Use  . Yes     History  Drug Use No    Social History   Social History  . Marital Status: Single    Spouse Name: N/A  . Number of Children: N/A  . Years of Education: N/A   Social History Main Topics  . Smoking status: Current Every Day Smoker -- 1.00 packs/day for 15 years    Types: Cigarettes  . Smokeless tobacco:  None  . Alcohol Use: Yes  . Drug Use: No  . Sexual Activity: Not Currently   Other Topics Concern  . None   Social History Narrative   Additional Social History:                          Allergies:  No Known Allergies  Labs:  Results for orders placed or performed during the hospital encounter of 07/17/15 (from the past 48 hour(s))  Basic metabolic panel     Status: Abnormal   Collection Time: 08/07/15  6:23 AM  Result Value Ref Range   Sodium 140 135 - 145 mmol/L   Potassium 3.2 (L) 3.5 - 5.1 mmol/L   Chloride 110 101 - 111 mmol/L   CO2 22 22 - 32 mmol/L   Glucose, Bld 95 65 - 99 mg/dL   BUN 10 6 - 20 mg/dL   Creatinine, Ser 0.45 0.44 - 1.00 mg/dL   Calcium 9.3 8.9 - 10.3 mg/dL   GFR calc non Af Amer >60 >60 mL/min   GFR calc Af Amer >60 >60 mL/min    Comment: (NOTE) The eGFR has been calculated using the CKD EPI equation. This calculation has not been validated in all clinical situations. eGFR's persistently <60 mL/min signify possible Chronic Kidney Disease.    Anion gap 8 5 - 15  Ammonia     Status: None   Collection Time: 08/07/15  6:23 AM  Result Value Ref Range   Ammonia 22 9 - 35 umol/L  Glucose, capillary     Status: Abnormal   Collection Time: 08/08/15  6:11 AM  Result Value Ref Range   Glucose-Capillary 274 (H) 65 - 99 mg/dL   Comment 1 Notify RN   Basic metabolic panel     Status: Abnormal   Collection Time: 08/08/15  9:07 AM  Result Value Ref Range   Sodium 141 135 - 145 mmol/L   Potassium 3.2 (L) 3.5 - 5.1 mmol/L   Chloride 107 101 - 111 mmol/L   CO2 22 22 - 32 mmol/L   Glucose, Bld 110 (H) 65 - 99 mg/dL   BUN 6 6 - 20 mg/dL   Creatinine, Ser 0.62 0.44 - 1.00 mg/dL   Calcium 9.7 8.9 - 10.3 mg/dL   GFR calc non Af Amer >60 >60 mL/min   GFR calc Af Amer >60 >60 mL/min    Comment: (NOTE) The eGFR has been calculated using the CKD EPI equation. This calculation has not been validated in all clinical situations. eGFR's persistently <60  mL/min signify possible Chronic Kidney Disease.    Anion gap 12 5 - 15  CBC     Status: Abnormal   Collection Time: 08/08/15  9:07 AM  Result Value Ref Range   WBC 10.9 (H) 4.0 - 10.5 K/uL   RBC 4.29 3.87 - 5.11 MIL/uL   Hemoglobin 12.8 12.0 - 15.0 g/dL   HCT 38.6 36.0 -  46.0 %   MCV 90.0 78.0 - 100.0 fL   MCH 29.8 26.0 - 34.0 pg   MCHC 33.2 30.0 - 36.0 g/dL   RDW 14.3 11.5 - 15.5 %   Platelets 218 150 - 400 K/uL    Current Facility-Administered Medications  Medication Dose Route Frequency Provider Last Rate Last Dose  . acetaminophen (TYLENOL) solution 500 mg  500 mg Oral Q6H PRN Cherene Altes, MD      . busPIRone (BUSPAR) tablet 10 mg  10 mg Oral BID Arnita Koons      . butalbital-acetaminophen-caffeine (FIORICET, ESGIC) 50-325-40 MG per tablet 1-2 tablet  1-2 tablet Oral Q4H PRN Cherene Altes, MD   2 tablet at 08/08/15 1338  . clonazePAM (KLONOPIN) tablet 0.5 mg  0.5 mg Oral TID PRN Login Muckleroy      . famotidine (PEPCID) tablet 20 mg  20 mg Oral BID Corky Sox, MD   20 mg at 08/08/15 0954  . feeding supplement (GLUCERNA SHAKE) (GLUCERNA SHAKE) liquid 237 mL  237 mL Oral BID BM Ardeen Garland, RD   237 mL at 08/08/15 1339  . [START ON 08/09/2015] FLUoxetine (PROZAC) 20 MG/5ML solution 20 mg  20 mg Oral Daily Saia Derossett      . heparin injection 5,000 Units  5,000 Units Subcutaneous 3 times per day [name], MD   5,000 Units at 08/07/15 2201  . Influenza vac split quadrivalent PF (FLUARIX) injection 0.5 mL  0.5 mL Intramuscular Prior to discharge Raylene Miyamoto, MD      . morphine 2 MG/ML injection 1 mg  1 mg Intravenous Q4H PRN Ripudeep K Rai, MD      . pneumococcal 23 valent vaccine (PNU-IMMUNE) injection 0.5 mL  0.5 mL Intramuscular Prior to discharge Ricka Burdock, [place]      . RESOURCE THICKENUP CLEAR   Oral PRN Raylene Miyamoto, MD      . traZODone (DESYREL) tablet 100 mg  100 mg Oral QHS PRN Amylee Lodato        Musculoskeletal: Strength &  Muscle Tone: decreased Gait & Station: unsteady Patient leans: Front  Psychiatric Specialty Exam: Review of Systems  Constitutional: Positive for malaise/fatigue.  Eyes: Negative.   Respiratory: Positive for cough.   Cardiovascular: Negative.   Gastrointestinal: Negative.   Genitourinary: Negative.   Musculoskeletal: Positive for myalgias.  Skin: Negative.   Neurological: Positive for tremors, weakness and headaches.  Endo/Heme/Allergies: Negative.   Psychiatric/Behavioral: Positive for depression. The patient is nervous/anxious and has insomnia.     Blood pressure 100/59, pulse 100, temperature 98.5 F (36.9 C), temperature source Oral, resp. rate 20, height 5' 7"  (1.702 m), weight 110.224 kg (243 lb), last menstrual period 07/31/2015, SpO2 100 %.Body mass index is 38.05 kg/(m^2).  General Appearance: Casual  Eye Contact::  Good  Speech:  Clear and Coherent  Volume:  Normal  Mood:  Anxious and Dysphoric  Affect:  Constricted  Thought Process:  Goal Directed  Orientation:  Full (Time, Place, and Person)  Thought Content:  Negative  Suicidal Thoughts:  No  Homicidal Thoughts:  No  Memory:  Immediate;   Fair Recent;   Fair Remote;   Good  Judgement:  Intact  Insight:  Fair  Psychomotor Activity:  Increased  Concentration:  Fair  Recall:  [place] of Knowledge:Good  Language: Good  Akathisia:  No  Handed:  Right  AIMS (if indicated):     Assets:  Communication Skills Desire for  Improvement Social Support  ADL's:  Intact  Cognition: WNL  Sleep:   fair   Treatment Plan Summary: Daily contact with patient to assess and evaluate symptoms and progress in treatment.   Medication management: -Continue Prozac 14m daily doe depression. -Decrease Clonazepam to 0.527mtid as needed for anxiety/panic disorder -Increase Buspar to 1067mid for anxiety.  Disposition: No evidence of imminent risk to self or others at present.   Patient does not meet criteria for psychiatric  inpatient admission. Supportive therapy provided about ongoing stressors. Patient should be referred to a psychiatrist upon discharge  AkiCorena PilgrimD 08/08/2015 1:48 PM

## 2015-08-08 NOTE — Progress Notes (Signed)
[facility]                                                                              Patient Demographics  Cynthia Murray, is a 45 y.o. female, DOB - 05/28/1971, VXB:[id number]  Admit date - 07/17/2015   Admitting Physician [name], MD  Outpatient Primary MD for the patient is Marcial Pacas, DO  LOS - 22   No chief complaint on file.      Brief HPI   45 yo F Hx tobacco abuse who was refered to [name] from [facility] where she presented with a 5 day hx of cough, sob, weakness, pleuritic chest pain, headache, and chills. She was found to be hypotensive and hypoxic. CXR showed LLL PNA. She was started on Levophed and IV Abx /Zithromax . ABG showed pH7.39 / PCO2 27 / Po2 56. Lactate was elevated at 4.1 BNP elevated ~2700. Troponin neg. WBC elevated~17K .   Pt is a heavy smoker. CT chest 02/12/15 showed a 4.7 x 3.4 cm mass in LLL. According to office notes she was recommended to have repeat CT chest but declined due to financial reasons.   Significant events 12/31 Transfer from [place], [place], Sepsis 12/31 CT head without contrast - negative acute findings 12/31 CT chest wo contrast - Lobar pneumonia of the left upper lobe with developing bronchopneumonia throughout the remaining portions of the lungs, presumably from endobronchial spread of infection 1/01 ARDS protocol 1/2 TTE - systolic function WNL - grade 1 DD - PA pressure: 47 mm Hg  1/10 patient removed her own Cortrak, replaced with OGT 1/11 Extubated 1/14 MRI brain - premature for age atrophy - likely chronic microvascular ischemic change 1/14 TRH assumed care  1/16 EEG - generalized irregular slow activity with no evidence of seizure activity   Assessment & Plan   Septic shock w/ Acute Hypoxic Respiratory Failuredue to Severe CAP > ARDS - Completed course of antibiotics, ARDS and shock resolved  - CT chest on 12/31 showed lobar pneumonia of the left upper lobe with developing bronchopneumonia  throughout the remaining portions of the lungs. The previously noted mass like opacity in the subcutaneous segment of the left lower lobe seen on prior study 7/29 has completely resolved.  Acute Encephalopathy / Delirium- improving  - Per sister at bed side, significantly improved from admission. Patient is still having random thought process. -now back on some home psychiatry medications -Brain MRI nondiagnostic -EEG unrevealing  -TSH 2.928 -complete metabolic w/u reveals elevated ammonia at 83 but is otherwise unremarkable -improvement in mental status really developed after home psych meds were resumed - Follow QTC, repeat EKG today showed improvement in QTC - Psych consult again called for evaluation and medication management - States she is anxious today and requesting for home dose of Klonopin., Restarted on Klonopin  Elevated ammonia -Improved, LFTs normal. Unclear significance.  Hypernatremia  -Improving, likely due to poor intake - patient encouraged to increase oral intake and agrees to do so in order to avoid NG feeding tube - resolved   H/O Depression / Anxiety  -Continuing home med regimen   Deconditioning  -PT recommended CIR,  consult placed, awaiting bed   Hypotension in pt w/ hx Hypertension -appears to be due to simple DH, continue IVF  Pulmonary hypertension -PA pressure 52mHg on TTE - no clinical symptoms presently - hopefully will improve as lungs continue to heal following ARDS  Chronic pain syndrome -H/O chronic Headaches & Chronic Back Pain -Patient reports takes fioricet at home for headaches.  Hypokalemia -Replaced   Morbid Obesity - Body mass index is 41.53 kg/(m^2).  Code Status: Full code  Family Communication: Discussed in detail with the patient, all imaging results, lab results explained to the patient   Disposition Plan: Pending psych evaluation and CIR  Time Spent in minutes 25 minutes  Procedures  CT chest  Consults   PC  CM Psychiatry  DVT Prophylaxis  heparin subcutaneous  Medications  Scheduled Meds: . busPIRone  5 mg Oral BID  . famotidine  20 mg Oral BID  . feeding supplement (GLUCERNA SHAKE)  237 mL Oral BID BM  . FLUoxetine  20 mg Oral Daily  . heparin subcutaneous  5,000 Units Subcutaneous 3 times per day   Continuous Infusions:   PRN Meds:.acetaminophen (TYLENOL) oral liquid 160 mg/5 mL, butalbital-acetaminophen-caffeine, clonazePAM, Influenza vac split quadrivalent PF, morphine injection, pneumococcal 23 valent vaccine, RESOURCE THICKENUP CLEAR  Antibiotics: Vanc 12/31 > 1/2  Vanc 1/6 > 1/8 Zosyn 12/31 > 1/2 Ceftriaxone 1/2 > 1/6 Ceftaz 1/6 > 1/9   Antibiotics   Anti-infectives    Start     Dose/Rate Route Frequency Ordered Stop   07/23/15 1400  vancomycin (VANCOCIN) IVPB 1000 mg/200 mL premix  Status:  Discontinued     1,000 mg 200 mL/hr over 60 Minutes Intravenous Every 8 hours 07/23/15 0410 07/25/15 1109   07/23/15 0800  cefTAZidime (FORTAZ) 2 g in dextrose 5 % 50 mL IVPB  Status:  Discontinued     2 g 100 mL/hr over 30 Minutes Intravenous 3 times per day 07/23/15 0717 07/26/15 1140   07/23/15 0445  vancomycin (VANCOCIN) 2,000 mg in sodium chloride 0.9 % 500 mL IVPB     2,000 mg 250 mL/hr over 120 Minutes Intravenous  Once 07/23/15 0410 07/23/15 0645   07/21/15 1000  cefTRIAXone (ROCEPHIN) 2 g in dextrose 5 % 50 mL IVPB  Status:  Discontinued     2 g 100 mL/hr over 30 Minutes Intravenous Every 24 hours 07/20/15 1559 07/23/15 0712   07/20/15 1630  cefTRIAXone (ROCEPHIN) 1 g in dextrose 5 % 50 mL IVPB     1 g 100 mL/hr over 30 Minutes Intravenous  Once 07/20/15 1559 07/20/15 2015   07/19/15 1000  cefTRIAXone (ROCEPHIN) 1 g in dextrose 5 % 50 mL IVPB  Status:  Discontinued     1 g 100 mL/hr over 30 Minutes Intravenous Every 24 hours 07/19/15 0903 07/20/15 1559   07/18/15 1400  vancomycin (VANCOCIN) 1,500 mg in sodium chloride 0.9 % 500 mL IVPB  Status:  Discontinued      1,500 mg 250 mL/hr over 120 Minutes Intravenous Every 24 hours 07/17/15 1351 07/18/15 0902   07/18/15 1000  vancomycin (VANCOCIN) IVPB 1000 mg/200 mL premix  Status:  Discontinued     1,000 mg 200 mL/hr over 60 Minutes Intravenous Every 12 hours 07/18/15 0902 07/19/15 0903   07/17/15 1400  vancomycin (VANCOCIN) IVPB 750 mg/150 ml premix     750 mg 150 mL/hr over 60 Minutes Intravenous  Once 07/17/15 1350 07/17/15 1546   07/17/15 1400  piperacillin-tazobactam (ZOSYN) IVPB 3.375 g  Status:  Discontinued     3.375 g 12.5 mL/hr over 240 Minutes Intravenous 3 times per day 07/17/15 1352 07/19/15 5956        Subjective:   Hettie Roselli was seen and examined today. Sister at the bedside, mental status somewhat better per the sister. Still tangential conversations with random thoughts. Asking for Klonopin. Patient denies dizziness, chest pain, shortness of breath, abdominal pain, N/V/D/C, new weakness, numbess, tingling. No acute events overnight.    Objective:   Blood pressure 100/59, pulse 100, temperature 98.5 F (36.9 C), temperature source Oral, resp. rate 20, height 5' 7"  (1.702 m), weight 110.224 kg (243 lb), last menstrual period 07/31/2015, SpO2 100 %.  Wt Readings from Last 3 Encounters:  08/07/15 110.224 kg (243 lb)  06/22/15 119.296 kg (263 lb)  04/05/15 119.296 kg (263 lb)     Intake/Output Summary (Last 24 hours) at 08/08/15 1308 Last data filed at 08/08/15 3875  Gross per 24 hour  Intake 1621.25 ml  Output   3100 ml  Net -1478.75 ml    Exam  General: Alert and oriented x 3, NAD, tangential conversations  HEENT:  PERRLA, EOMI, Anicteric Sclera, mucous membranes moist.   Neck: Supple, no JVD, no masses  CVS: S1 S2 auscultated, no rubs, murmurs or gallops. Regular rate and rhythm.  Respiratory: Clear to auscultation bilaterally, no wheezing, rales or rhonchi  Abdomen: Soft, nontender, nondistended, + bowel sounds  Ext: no cyanosis clubbing or  edema  Neuro: AAOx3, Cr N's II- XII. Strength 5/5 upper and lower extremities bilaterally  Skin: No rashes  Psych: Normal affect and demeanor, alert and oriented x3    Data Review   Micro Results Recent Results (from the past 240 hour(s))  Culture, Urine     Status: None   Collection Time: 07/31/15  4:45 PM  Result Value Ref Range Status   Specimen Description URINE, CATHETERIZED  Final   Special Requests NONE  Final   Culture NO GROWTH 2 DAYS  Final   Report Status 08/02/2015 FINAL  Final  Culture, blood (routine x 2)     Status: None   Collection Time: 07/31/15  6:40 PM  Result Value Ref Range Status   Specimen Description BLOOD RIGHT ANTECUBITAL  Final   Special Requests IN PEDIATRIC BOTTLE [place]  Final   Culture NO GROWTH 5 DAYS  Final   Report Status 08/05/2015 FINAL  Final  Culture, blood (routine x 2)     Status: None   Collection Time: 07/31/15  6:45 PM  Result Value Ref Range Status   Specimen Description BLOOD RIGHT HAND  Final   Special Requests IN PEDIATRIC BOTTLE [place]  Final   Culture NO GROWTH 5 DAYS  Final   Report Status 08/05/2015 FINAL  Final    Radiology Reports Ct Head Wo Contrast  07/17/2015  CLINICAL DATA:  Headache for the last 5 days. Pneumonia. Left-sided lung mass. EXAM: CT HEAD WITHOUT CONTRAST TECHNIQUE: Contiguous axial images were obtained from the base of the skull through the vertex without intravenous contrast. COMPARISON:  None. FINDINGS: Sinuses/Soft tissues: Clear paranasal sinuses and mastoid air cells. Intracranial: No mass lesion, hemorrhage, hydrocephalus, acute infarct, intra-axial, or extra-axial fluid collection. IMPRESSION: Normal head CT. Electronically Signed   By: Abigail Miyamoto M.D.   On: 07/17/2015 16:13   Ct Chest Wo Contrast  07/17/2015  CLINICAL DATA:  45 year old female with headache and weakness for the past 5 days. Worsening shortness of breath. EXAM: CT CHEST WITHOUT CONTRAST TECHNIQUE: Multidetector  CT imaging of the chest  was performed following the standard protocol without IV contrast. COMPARISON:  Chest CT 02/12/2015. FINDINGS: Mediastinum/Lymph Nodes: Heart size is normal. There is no significant pericardial fluid, thickening or pericardial calcification. No pathologically enlarged mediastinal or hilar lymph nodes. Please note that accurate exclusion of hilar adenopathy is limited on noncontrast CT scans. Esophagus is unremarkable in appearance. No axillary lymphadenopathy. Lungs/Pleura: The previously noted mass-like opacity in the superior segment of the left lower lobe seen on prior study 02/12/2015 has completely resolved. However, there is now near complete lobar consolidation throughout the left upper lobe, and widespread peribronchovascular ground-glass attenuation, consolidation thickening of the peribronchovascular interstitium throughout the remainder of the lungs bilaterally, indicative of early endobronchial spread of infection. No pleural effusions. Surgical clips in the infrahilar aspect of the medial right lower lobe. Upper Abdomen: Status post cholecystectomy. Musculoskeletal/Soft Tissues: There are no aggressive appearing lytic or blastic lesions noted in the visualized portions of the skeleton. IMPRESSION: 1. Lobar pneumonia of the left upper lobe with developing bronchopneumonia throughout the remaining portions of the lungs, presumably from endobronchial spread of infection. 2. Additional incidental findings, as above. Electronically Signed   By: Vinnie Langton M.D.   On: 07/17/2015 16:34   Mr Brain Wo Contrast  07/31/2015  CLINICAL DATA:  Septic shock. ARDS. Lobar pneumonia. Altered mentation with delirium. EXAM: MRI HEAD WITHOUT CONTRAST TECHNIQUE: Multiplanar, multiecho pulse sequences of the brain and surrounding structures were obtained without intravenous contrast. COMPARISON:  CT head 07/17/2015. FINDINGS: No evidence for acute infarction, hemorrhage, mass lesion, hydrocephalus, or extra-axial  fluid. Premature for age atrophy. Mild subcortical and periventricular T2 and FLAIR hyperintensities, likely chronic microvascular ischemic change. Pituitary, pineal, and cerebellar tonsils unremarkable. No upper cervical lesions. Flow voids are maintained throughout the carotid, basilar, and vertebral arteries. There are no areas of chronic hemorrhage. Visualized calvarium, skull base, and upper cervical osseous structures unremarkable. Scalp and extracranial soft tissues, orbits, sinuses, and mastoids show no acute process. IMPRESSION: Premature for age atrophy. Mild, but abnormal, white matter signal abnormality, likely chronic microvascular ischemic change. No acute intracranial findings. Electronically Signed   By: Staci Righter M.D.   On: 07/31/2015 17:49   Dg Chest Port 1 View  07/31/2015  CLINICAL DATA:  Pneumonia EXAM: PORTABLE CHEST 1 VIEW COMPARISON:  Radiograph 07/28/2015 FINDINGS: Interval extubation. Normal cardiac silhouette. Low lung volumes. Improvement in vascular congestion compared to prior. IMPRESSION: Low lung volumes and mild vascular congestion improved from prior. Electronically Signed   By: Suzy Bouchard M.D.   On: 07/31/2015 17:22   Dg Chest Port 1 View  07/28/2015  CLINICAL DATA:  Respiratory failure, pneumonia, sepsis, ARDS. EXAM: PORTABLE CHEST 1 VIEW COMPARISON:  Portable chest x-ray of July 27, 2015 FINDINGS: The lungs are slightly better inflated today. Aeration of the left upper lung has improved. There is persistent interstitial density at the right lung base. On the left the mildly increased airspace densities persists. There remains partial obscuration of the left hemidiaphragm. A left pleural effusion cannot be excluded. The cardiac silhouette is top-normal in size. The pulmonary vascularity is not engorged. The endotracheal tube tip lies 5.6 cm above the carina. The esophagogastric tube tip projects below the inferior margin of the image. IMPRESSION: Persistent  bibasilar and left perihilar atelectasis or pneumonia. There may be a left pleural effusion layering posteriorly. There is no significant pulmonary edema. Electronically Signed   By: David  Martinique M.D.   On: 07/28/2015 08:20   Dg Chest  Port 1 View  07/27/2015  CLINICAL DATA:  Evaluate pulmonary edema EXAM: PORTABLE CHEST 1 VIEW COMPARISON:  07/26/2015 FINDINGS: No unchanged endotracheal tube or orogastric tube. Discoid atelectasis right lung base similar to prior study. Hazy opacity over the left thorax unchanged suggesting a combination of pleural effusion and alveolar opacity. Mildly decreased vascular congestion. Decreased interstitial prominence. IMPRESSION: Improving pulmonary edema with persistent hazy density left thorax suggesting pleural effusion. Electronically Signed   By: Skipper Cliche M.D.   On: 07/27/2015 09:05   Dg Chest Port 1 View  07/26/2015  CLINICAL DATA:  Respiratory failure. EXAM: PORTABLE CHEST 1 VIEW COMPARISON:  07/25/2015. FINDINGS: Endotracheal tube and feeding tube in stable position. Mediastinum and hilar structures are stable . Cardiomegaly with pulmonary venous congestion and bilateral pulmonary infiltrates again noted suggesting congestive heart failure. Bilateral pneumonia cannot be excluded. Low lung volumes with basilar atelectasis. Small bilateral pleural effusions cannot be excluded. No pneumothorax. IMPRESSION: 1. Lines and tubes stable position. 2. Cardiomegaly with pulmonary vascular prominence and bilateral pulmonary infiltrates. Small bilateral effusions. Findings consistent with congestive heart failure. No interim change. 3. Low lung volumes with basilar atelectasis. Electronically Signed   By: Marcello Moores  Register   On: 07/26/2015 07:34   Dg Chest Port 1 View  07/25/2015  CLINICAL DATA:  Respiratory failure, pneumonia, sepsis, ARDS EXAM: PORTABLE CHEST 1 VIEW COMPARISON:  Chest x-rays dated 07/23/2015 and 07/22/2015. FINDINGS: Endotracheal tube remains well  positioned with tip approximately 3 cm above the level of the carina. Enteric tube passes below the diaphragm. Cardiomegaly is stable. There is persistent central pulmonary vascular congestion and mild bilateral interstitial edema, not significantly changed. Suspect additional atelectasis at each lung base, possible small left pleural effusion. No pneumothorax IMPRESSION: No significant interval change. Persistent central pulmonary vascular congestion and mild bilateral interstitial edema, suggesting mild volume overload/CHF. Suspect additional atelectasis at each lung base and small left pleural effusion. Electronically Signed   By: Franki Cabot M.D.   On: 07/25/2015 08:26   Dg Chest Port 1 View  07/23/2015  CLINICAL DATA:  Respiratory failure, pneumonia, sepsis, ARDS. EXAM: PORTABLE CHEST 1 VIEW COMPARISON:  Portable chest x-ray of July 22, 2015 FINDINGS: The lungs are slightly better inflated today. There remain coarse interstitial and alveolar opacities but there has been overall improvement. The cardiac silhouette is top-normal in size but the margins are obscured. The pulmonary vascularity is engorged but slightly more distinct. There is no large pleural effusion or nor evidence of a pneumothorax. The endotracheal tube tip lies 4.2 cm above the carina. The feeding tube tip projects below the inferior margin of the image. The left internal jugular venous catheter tip projects over the proximal SVC. IMPRESSION: Slight interval improvement in pulmonary interstitial and alveolar edema but CHF remains. The support tubes are in reasonable position. Electronically Signed   By: David  Martinique M.D.   On: 07/23/2015 07:46   Dg Chest Port 1 View  07/22/2015  CLINICAL DATA:  Respiratory failure.  Shortness breath. EXAM: PORTABLE CHEST - 1 VIEW COMPARISON:  One-view chest x-ray 07/21/2015 FINDINGS: The heart is enlarged. Diffuse edema is not significantly changed. Left pleural effusion is suspected. The endotracheal  tube is stable at 2.1 cm above the carina. A left IJ line is in place. A feeding tube courses off the inferior border of the film. The lung volumes remain low. IMPRESSION: 1. Similar appearance of low lung volumes and diffuse interstitial and airspace disease. This may represent edema, pneumonia, or ARDS. 2. Support  apparatus is stable. Electronically Signed   By: San Morelle M.D.   On: 07/22/2015 06:55   Dg Chest Port 1 View  07/21/2015  CLINICAL DATA:  ARDS EXAM: PORTABLE CHEST 1 VIEW COMPARISON:  Portable chest x-ray of July 20, 2015 FINDINGS: The lungs remain hypoinflated. Confluent alveolar opacity on the left has markedly improved. There has also been some improvement on the right. The interstitial markings remain increased. There is no large pleural effusion and no pneumothorax. The visualized portions of the cardiac silhouette are normal in size. The endotracheal tube tip lies 2.6 cm above the carina. The Doppler pop type feeding tube tip projects below the inferior margin of the image. The left internal jugular venous catheter tip projects at the junction of the right and left brachiocephalic veins. IMPRESSION: Interval decrease in alveolar opacities consistent with resolving edema, pneumonia, or ARDS. Electronically Signed   By: David  Martinique M.D.   On: 07/21/2015 07:31   Dg Chest Port 1 View  07/20/2015  CLINICAL DATA:  Pneumonia, sepsis, acute respiratory failure EXAM: PORTABLE CHEST 1 VIEW COMPARISON:  Portable chest x-ray of July 19, 2015 FINDINGS: A large amount of soft tissue projects over both lungs. However, confluent alveolar opacities have worsened on the left. They are also more conspicuous at the right lung base. The cardiac silhouette is obscured. The endotracheal tube tip projects approximately 6 cm above the carina. The left internal jugular venous catheter tip projects over the junction of the right and left brachiocephalic veins. The feeding tube tip projects below the  inferior margin of the image. IMPRESSION: Progressive alveolar opacities throughout the left lung and at the right lung base consistent with worsening pneumonia or pulmonary edema. The patient's prone position may be contributing to the findings. The support tubes are in reasonable position. Electronically Signed   By: David  Martinique M.D.   On: 07/20/2015 07:22   Dg Chest Port 1 View  07/19/2015  CLINICAL DATA:  45 year old female with pneumococcal pneumonia. Subsequent encounter. EXAM: PORTABLE CHEST 1 VIEW COMPARISON:  07/18/2015 FINDINGS: Endotracheal tube tip 4.2 cm above the carina. Left central line tip projects at the level of the proximal superior vena cava. No gross pneumothorax. Nasogastric tube courses below the diaphragm. Tip is not included on the present exam. Prominent asymmetric airspace disease greater on the left without significant change (taking into account slight differences in technique and position). Cardiomegaly. IMPRESSION: Diffuse asymmetric airspace disease greater on left without significant change. Electronically Signed   By: Genia Del M.D.   On: 07/19/2015 07:48   Dg Chest Port 1 View  07/18/2015  CLINICAL DATA:  45 year old female with central line placement and pneumonia. EXAM: PORTABLE CHEST 1 VIEW COMPARISON:  Radiograph dated 07/07/2015 FINDINGS: Endotracheal tube remains above the carina enteric tube courses towards the left hemiabdomen. There has been interval placement of the left IJ central line with tip over central SVC. Bilateral diffuse airspace opacities, left greater right again noted. No pneumothorax. Stable cardiac silhouette. The osseous structures appear unremarkable. IMPRESSION: Interval placement of a left IJ central line with tip over central SVC. No pneumothorax. Diffuse bilateral airspace opacities. Electronically Signed   By: Anner Crete M.D.   On: 07/18/2015 04:37   Dg Chest Port 1 View  07/17/2015  CLINICAL DATA:  Patient status post ET tube  placement. EXAM: PORTABLE CHEST 1 VIEW COMPARISON:  Chest CT earlier same day FINDINGS: ET tube terminates in the distal trachea. Enteric tube courses inferior to the diaphragm. Obscured  cardiac and mediastinal contours. Diffuse left-greater-than-right pulmonary airspace opacities. No large pleural effusion or pneumothorax. IMPRESSION: ET tube terminates in the distal trachea, recommend retraction. Left-greater-than-right airspace opacities compatible with extensive pneumonia. These results were called by telephone at the time of interpretation on 07/17/2015 at 6:59 pm to Rose Phi, RN, who verbally acknowledged these results. Electronically Signed   By: Lovey Newcomer M.D.   On: 07/17/2015 19:01   Dg Abd Portable 1v  07/27/2015  CLINICAL DATA:  Encounter for orogastric tube placement. EXAM: PORTABLE ABDOMEN - 1 VIEW COMPARISON:  Abdominal radiograph 07/19/2015 FINDINGS: Tip and side port of the enteric tube below the diaphragm in the stomach, the tip is directed towards the fundus. No dilated bowel loops in the upper abdomen. Air seen within the colon. IMPRESSION: Tip and side port of the enteric tube below the diaphragm in the stomach, tip directed towards the fundus. Electronically Signed   By: Jeb Levering M.D.   On: 07/27/2015 19:22   Dg Abd Portable 1v  07/19/2015  CLINICAL DATA:  Feeding tube placement EXAM: PORTABLE ABDOMEN - 1 VIEW COMPARISON:  None. FINDINGS: 1221 hours. The tip of the feeding tube is identified in the descending portion of the duodenum. Bowel gas pattern is normal. IMPRESSION: Feeding tube tip is transpyloric. Electronically Signed   By: Misty Stanley M.D.   On: 07/19/2015 12:29    CBC  Recent Labs Lab 08/02/15 0443 08/03/15 0445 08/05/15 0504 08/08/15 0907  WBC 13.0* 14.3* 11.0* 10.9*  HGB 13.7 13.8 12.7 12.8  HCT 42.3 43.3 38.9 38.6  PLT 549* 482* 335 218  MCV 93.2 95.2 90.3 90.0  MCH 30.2 30.3 29.5 29.8  MCHC 32.4 31.9 32.6 33.2  RDW 15.1 15.1 13.7 14.3   LYMPHSABS 4.6*  --   --   --   MONOABS 0.9  --   --   --   EOSABS 0.0  --   --   --   BASOSABS 0.1  --   --   --     Chemistries   Recent Labs Lab 08/02/15 0443 08/03/15 0445 08/05/15 0504 08/07/15 0623 08/08/15 0907  NA 152* 150* 142 140 141  K 4.1 4.5 4.7 3.2* 3.2*  CL 119* 116* 111 110 107  CO2 21* 23 24 22 22   GLUCOSE 116* 96 99 95 110*  BUN 28* 30* 15 10 6   CREATININE 0.70 0.77 0.60 0.45 0.62  CALCIUM 10.1 10.1 9.6 9.3 9.7  MG 2.5*  --   --   --   --   AST 18 38 26  --   --   ALT 28 33 28  --   --   ALKPHOS 61 57 48  --   --   BILITOT 0.7 1.4* 0.8  --   --    ------------------------------------------------------------------------------------------------------------------ estimated creatinine clearance is 117.1 mL/min (by C-G formula based on Cr of 0.62). ------------------------------------------------------------------------------------------------------------------ No results for input(s): HGBA1C in the last 72 hours. ------------------------------------------------------------------------------------------------------------------ No results for input(s): CHOL, HDL, LDLCALC, TRIG, CHOLHDL, LDLDIRECT in the last 72 hours. ------------------------------------------------------------------------------------------------------------------ No results for input(s): TSH, T4TOTAL, T3FREE, THYROIDAB in the last 72 hours.  Invalid input(s): FREET3 ------------------------------------------------------------------------------------------------------------------ No results for input(s): VITAMINB12, FOLATE, FERRITIN, TIBC, IRON, RETICCTPCT in the last 72 hours.  Coagulation profile No results for input(s): INR, PROTIME in the last 168 hours.  No results for input(s): DDIMER in the last 72 hours.  Cardiac Enzymes No results for input(s): CKMB, TROPONINI, MYOGLOBIN in the last 168 hours.  Invalid input(s):  CK ------------------------------------------------------------------------------------------------------------------ Invalid input(s): POCBNP   Recent Labs  08/08/15 0611  GLUCAP 274*     Johney Perotti M.D. [facility] 08/08/2015, 1:08 PM  Pager: [phone] Between 7am to 7pm - call Pager - [phone]  After 7pm go to [email or web address] - password [email or web address]  Call night coverage person covering after 7pm

## 2015-08-09 ENCOUNTER — Inpatient Hospital Stay (HOSPITAL_COMMUNITY)
Admission: AD | Admit: 2015-08-09 | Discharge: 2015-08-17 | DRG: 072 | Disposition: A | Discharge status: Home / Self Care | Payer: Self-pay | Source: Intra-hospital | Attending: Physical Medicine & Rehabilitation | Admitting: Physical Medicine & Rehabilitation

## 2015-08-09 DIAGNOSIS — G9340 Encephalopathy, unspecified: Principal | ICD-10-CM | POA: Diagnosis present

## 2015-08-09 DIAGNOSIS — R000 Tachycardia, unspecified: Secondary | ICD-10-CM | POA: Diagnosis present

## 2015-08-09 DIAGNOSIS — M549 Dorsalgia, unspecified: Secondary | ICD-10-CM | POA: Diagnosis present

## 2015-08-09 DIAGNOSIS — R5381 Other malaise: Secondary | ICD-10-CM

## 2015-08-09 DIAGNOSIS — Z8249 Family history of ischemic heart disease and other diseases of the circulatory system: Secondary | ICD-10-CM

## 2015-08-09 DIAGNOSIS — G8929 Other chronic pain: Secondary | ICD-10-CM | POA: Diagnosis present

## 2015-08-09 DIAGNOSIS — Z791 Long term (current) use of non-steroidal anti-inflammatories (NSAID): Secondary | ICD-10-CM

## 2015-08-09 DIAGNOSIS — G9341 Metabolic encephalopathy: Secondary | ICD-10-CM | POA: Diagnosis present

## 2015-08-09 DIAGNOSIS — I10 Essential (primary) hypertension: Secondary | ICD-10-CM | POA: Diagnosis present

## 2015-08-09 DIAGNOSIS — G43909 Migraine, unspecified, not intractable, without status migrainosus: Secondary | ICD-10-CM | POA: Diagnosis present

## 2015-08-09 DIAGNOSIS — T7840XA Allergy, unspecified, initial encounter: Secondary | ICD-10-CM | POA: Diagnosis present

## 2015-08-09 DIAGNOSIS — F17210 Nicotine dependence, cigarettes, uncomplicated: Secondary | ICD-10-CM | POA: Diagnosis present

## 2015-08-09 DIAGNOSIS — F419 Anxiety disorder, unspecified: Secondary | ICD-10-CM

## 2015-08-09 DIAGNOSIS — R251 Tremor, unspecified: Secondary | ICD-10-CM | POA: Diagnosis present

## 2015-08-09 DIAGNOSIS — Z818 Family history of other mental and behavioral disorders: Secondary | ICD-10-CM

## 2015-08-09 DIAGNOSIS — F4323 Adjustment disorder with mixed anxiety and depressed mood: Secondary | ICD-10-CM | POA: Diagnosis present

## 2015-08-09 DIAGNOSIS — F411 Generalized anxiety disorder: Secondary | ICD-10-CM | POA: Diagnosis present

## 2015-08-09 DIAGNOSIS — Z79899 Other long term (current) drug therapy: Secondary | ICD-10-CM

## 2015-08-09 DIAGNOSIS — F410 Panic disorder [episodic paroxysmal anxiety] without agoraphobia: Secondary | ICD-10-CM | POA: Diagnosis present

## 2015-08-09 DIAGNOSIS — E876 Hypokalemia: Secondary | ICD-10-CM | POA: Diagnosis present

## 2015-08-09 DIAGNOSIS — D72829 Elevated white blood cell count, unspecified: Secondary | ICD-10-CM | POA: Diagnosis present

## 2015-08-09 LAB — BASIC METABOLIC PANEL
ANION GAP: 13 (ref 5–15)
BUN: 5 mg/dL — ABNORMAL LOW (ref 6–20)
CALCIUM: 9.9 mg/dL (ref 8.9–10.3)
CHLORIDE: 107 mmol/L (ref 101–111)
CO2: 22 mmol/L (ref 22–32)
Creatinine, Ser: 0.74 mg/dL (ref 0.44–1.00)
GFR calc Af Amer: >60 mL/min (ref >60)
GFR calc non Af Amer: >60 mL/min (ref >60)
GLUCOSE: 109 mg/dL — AB (ref 65–99)
Potassium: 3.5 mmol/L (ref 3.5–5.1)
Sodium: 142 mmol/L (ref 135–145)

## 2015-08-09 LAB — CBC
HEMATOCRIT: 40.7 % (ref 36.0–46.0)
HEMOGLOBIN: 13.4 g/dL (ref 12.0–15.0)
MCH: 30 pg (ref 26.0–34.0)
MCHC: 32.9 g/dL (ref 30.0–36.0)
MCV: 91.1 fL (ref 78.0–100.0)
Platelets: 258 10*3/uL (ref 150–400)
RBC: 4.47 MIL/uL (ref 3.87–5.11)
RDW: 14.5 % (ref 11.5–15.5)
WBC: 13.5 10*3/uL — ABNORMAL HIGH (ref 4.0–10.5)

## 2015-08-09 MED ORDER — ENOXAPARIN SODIUM 40 MG/0.4ML ~~LOC~~ SOLN
40.0000 mg | SUBCUTANEOUS | Status: DC
  Filled 2015-08-09: qty 0.4

## 2015-08-09 MED ORDER — TRAMADOL HCL 50 MG PO TABS
50.0000 mg | ORAL_TABLET | Freq: Four times a day (QID) | ORAL | Status: DC | PRN
  Administered 2015-08-12 – 2015-08-17 (×13): 50 mg via ORAL
  Filled 2015-08-09 (×13): qty 1

## 2015-08-09 MED ORDER — METOPROLOL TARTRATE 12.5 MG HALF TABLET
12.5000 mg | ORAL_TABLET | Freq: Two times a day (BID) | ORAL | Status: DC
  Administered 2015-08-09: 12.5 mg via ORAL
  Filled 2015-08-09: qty 1

## 2015-08-09 MED ORDER — DIPHENHYDRAMINE HCL 12.5 MG/5ML PO ELIX: 12.5000 mg | ORAL_SOLUTION | Freq: Four times a day (QID) | ORAL | Status: DC | PRN

## 2015-08-09 MED ORDER — CLONAZEPAM 0.5 MG PO TABS: 0.5000 mg | ORAL_TABLET | Freq: Three times a day (TID) | ORAL | Status: DC | PRN

## 2015-08-09 MED ORDER — BUTALBITAL-APAP-CAFFEINE 50-325-40 MG PO TABS: 1.0000 | ORAL_TABLET | ORAL | Status: DC | PRN

## 2015-08-09 MED ORDER — CLONAZEPAM 0.5 MG PO TABS
0.5000 mg | ORAL_TABLET | Freq: Three times a day (TID) | ORAL | Status: DC | PRN
  Administered 2015-08-09 – 2015-08-17 (×10): 0.5 mg via ORAL
  Filled 2015-08-09 (×10): qty 1

## 2015-08-09 MED ORDER — BUSPIRONE HCL 10 MG PO TABS
10.0000 mg | ORAL_TABLET | Freq: Two times a day (BID) | ORAL | Status: DC
  Administered 2015-08-09 – 2015-08-17 (×16): 10 mg via ORAL
  Filled 2015-08-09 (×16): qty 1

## 2015-08-09 MED ORDER — TRAZODONE HCL 100 MG PO TABS: 100.0000 mg | ORAL_TABLET | Freq: Every evening | ORAL | Status: DC | PRN

## 2015-08-09 MED ORDER — GLUCERNA SHAKE PO LIQD
237.0000 mL | Freq: Two times a day (BID) | ORAL | Status: DC
  Administered 2015-08-10 – 2015-08-16 (×14): 237 mL via ORAL

## 2015-08-09 MED ORDER — FAMOTIDINE 20 MG PO TABS
20.0000 mg | ORAL_TABLET | Freq: Two times a day (BID) | ORAL | Status: DC
  Administered 2015-08-09 – 2015-08-17 (×16): 20 mg via ORAL
  Filled 2015-08-09 (×16): qty 1

## 2015-08-09 MED ORDER — POLYETHYLENE GLYCOL 3350 17 G PO PACK
17.0000 g | PACK | Freq: Every day | ORAL | Status: DC | PRN
Start: 2015-08-09 — End: 2015-08-17

## 2015-08-09 MED ORDER — PROMETHAZINE HCL 12.5 MG PO TABS: 12.5000 mg | ORAL_TABLET | Freq: Four times a day (QID) | ORAL | Status: DC | PRN

## 2015-08-09 MED ORDER — METHOCARBAMOL 500 MG PO TABS
500.0000 mg | ORAL_TABLET | Freq: Four times a day (QID) | ORAL | Status: DC | PRN
  Administered 2015-08-11 – 2015-08-17 (×14): 500 mg via ORAL
  Filled 2015-08-09 (×15): qty 1

## 2015-08-09 MED ORDER — ALUM & MAG HYDROXIDE-SIMETH 200-200-20 MG/5ML PO SUSP: 30.0000 mL | ORAL | Status: DC | PRN

## 2015-08-09 MED ORDER — TRAZODONE HCL 50 MG PO TABS
100.0000 mg | ORAL_TABLET | Freq: Every evening | ORAL | Status: DC | PRN
  Administered 2015-08-09 – 2015-08-16 (×8): 100 mg via ORAL
  Filled 2015-08-09 (×9): qty 2

## 2015-08-09 MED ORDER — GUAIFENESIN-DM 100-10 MG/5ML PO SYRP: 5.0000 mL | ORAL_SOLUTION | Freq: Four times a day (QID) | ORAL | Status: DC | PRN

## 2015-08-09 MED ORDER — PROMETHAZINE HCL 12.5 MG RE SUPP: 12.5000 mg | Freq: Four times a day (QID) | RECTAL | Status: DC | PRN

## 2015-08-09 MED ORDER — FLUOXETINE HCL 20 MG/5ML PO SOLN
20.0000 mg | Freq: Every day | ORAL | Status: DC
  Administered 2015-08-10 – 2015-08-12 (×3): 20 mg via ORAL
  Filled 2015-08-09 (×4): qty 5

## 2015-08-09 MED ORDER — FLUOXETINE HCL 20 MG PO CAPS: 20.0000 mg | ORAL_CAPSULE | Freq: Every day | ORAL | Status: DC

## 2015-08-09 MED ORDER — METOPROLOL TARTRATE 25 MG PO TABS: 12.5000 mg | ORAL_TABLET | Freq: Two times a day (BID) | ORAL | Status: DC

## 2015-08-09 MED ORDER — FLEET ENEMA 7-19 GM/118ML RE ENEM: 1.0000 | ENEMA | Freq: Once | RECTAL | Status: DC | PRN

## 2015-08-09 MED ORDER — ACETAMINOPHEN 160 MG/5ML PO SOLN
500.0000 mg | Freq: Four times a day (QID) | ORAL | Status: DC | PRN
  Administered 2015-08-12 – 2015-08-13 (×2): 500 mg via ORAL
  Filled 2015-08-09 (×2): qty 20.3

## 2015-08-09 MED ORDER — ENOXAPARIN SODIUM 40 MG/0.4ML ~~LOC~~ SOLN
40.0000 mg | SUBCUTANEOUS | Status: DC
  Administered 2015-08-09 – 2015-08-16 (×8): 40 mg via SUBCUTANEOUS
  Filled 2015-08-09 (×8): qty 0.4

## 2015-08-09 MED ORDER — PROMETHAZINE HCL 25 MG/ML IJ SOLN: 12.5000 mg | Freq: Four times a day (QID) | INTRAMUSCULAR | Status: DC | PRN

## 2015-08-09 MED ORDER — BISACODYL 10 MG RE SUPP: 10.0000 mg | Freq: Every day | RECTAL | Status: DC | PRN

## 2015-08-09 NOTE — Discharge Summary (Signed)
Physician Discharge Summary   Patient ID: Cynthia Murray MRN: [id number] DOB/AGE: 10-22-1970 45 y.o.  Admit date: 07/17/2015 Discharge date: 08/09/2015  Primary Care Physician:  Marcial Pacas, DO  Discharge Diagnoses:   . Acute respiratory failure with hypoxia ([place]) . severe pneumococcal lobar pneumonia  . Sepsis ([place]) with septic shock  . AKI (acute kidney injury) ([place]) . migraines  . Endotracheally intubated . ARDS (adult respiratory distress syndrome) ([place]) . Critical illness myopathy . Pulmonary hypertension ([place]) . Chronic pain syndrome . Acute encephalopathy/delirium  . Anxiety state/generalized anxiety disorder/panic disorder  . Essential hypertension . Hypokalemia . Major depressive disorder, recurrent episode [facility])  Consults   Pulmonary critical care Psychiatry CIR   Recommendations for Outpatient Follow-up:  1. Please note psychiatry recommendations, Klonopin decreased 2.5 mg 3 times a day as needed for anxiety 2. Several medication has been discontinued 3. Please repeat CBC/BMET at next visit   DIET: heart healthy diet    Allergies:  No Known Allergies   DISCHARGE MEDICATIONS: Current Discharge Medication List    START taking these medications   Details  butalbital-acetaminophen-caffeine (FIORICET, ESGIC) 50-325-40 MG tablet Take 1-2 tablets by mouth every 4 (four) hours as needed for headache. Qty: 14 tablet, Refills: 0    traZODone (DESYREL) 100 MG tablet Take 1 tablet (100 mg total) by mouth at bedtime as needed for sleep.      CONTINUE these medications which have CHANGED   Details  clonazePAM (KLONOPIN) 0.5 MG tablet Take 1 tablet (0.5 mg total) by mouth 3 (three) times daily as needed (anxiety). Qty: 30 tablet, Refills: 0    FLUoxetine (PROZAC) 20 MG capsule Take 1 capsule (20 mg total) by mouth daily. Qty: 30 capsule, Refills: 0    metoprolol tartrate (LOPRESSOR) 25 MG tablet Take 0.5 tablets (12.5 mg total) by mouth 2 (two) times  daily. Qty: 60 tablet, Refills: 5   Associated Diagnoses: Other migraine without status migrainosus, not intractable      CONTINUE these medications which have NOT CHANGED   Details  busPIRone (BUSPAR) 10 MG tablet Take 1 tablet by mouth twice daily Qty: 60 tablet, Refills: 5   Associated Diagnoses: Anxiety    medroxyPROGESTERone (DEPO-PROVERA) 150 MG/ML injection Inject 150 mg into the muscle every 3 (three) months. Last injection 06/22/15    meloxicam (MOBIC) 15 MG tablet Take 1 tablet (15 mg total) by mouth daily. For back. Qty: 30 tablet, Refills: 4   Associated Diagnoses: Left lumbar radiculopathy    ranitidine (ZANTAC) 75 MG tablet Take 75 mg by mouth 2 (two) times daily.       STOP taking these medications     amitriptyline (ELAVIL) 150 MG tablet      Aspirin-Acetaminophen-Caffeine (GOODY HEADACHE PO)      Dextromethorphan Polistirex (DELSYM PO)      diphenhydrAMINE (BENADRYL) 25 mg capsule      gabapentin (NEURONTIN) 300 MG capsule      hydrochlorothiazide (HYDRODIURIL) 25 MG tablet      ibuprofen (ADVIL,MOTRIN) 200 MG tablet      Pseudoeph-Doxylamine-DM-APAP (NYQUIL PO)      tizanidine (ZANAFLEX) 6 MG capsule      traMADol (ULTRAM) 50 MG tablet          Brief H and P: For complete details please refer to admission H and P, but in brief 45 yo F Hx tobacco abuse who was refered to [facility] from [facility] ER where she presented with a 5 day hx of cough, sob, weakness, pleuritic chest  pain, headache, and chills. She was found to be hypotensive and hypoxic. CXR showed LLL PNA. She was started on Levophed and IV Abx /Zithromax . ABG showed pH7.39 / PCO2 27 / Po2 56. Lactate was elevated at 4.1 BNP elevated ~2700. Troponin neg. WBC elevated~17K .   Pt is a heavy smoker. CT chest 02/12/15 showed a 4.7 x 3.4 cm mass in LLL. According to office notes she was recommended to have repeat CT chest but declined due to financial reasons.   Significant events 12/31 Transfer  from [place], [place], Sepsis 12/31 CT head without contrast - negative acute findings 12/31 CT chest wo contrast - Lobar pneumonia of the left upper lobe with developing bronchopneumonia throughout the remaining portions of the lungs, presumably from endobronchial spread of infection 1/01 ARDS protocol 1/2 TTE - systolic function WNL - grade 1 DD - PA pressure: 47 mm Hg  1/10 patient removed her own Cortrak, replaced with OGT 1/11 Extubated 1/14 MRI brain - premature for age atrophy - likely chronic microvascular ischemic change 1/14 [facility] assumed care  1/16 EEG - generalized irregular slow activity with no evidence of seizure activity  Hospital Course:   Septic shock w/ Acute Hypoxic Respiratory Failuredue to Severe CAP > ARDS Patient was admitted to intensive care unit with septic shock and ARDS protocol. -Patient has completed course of antibiotics, ARDS and shock resolved  - CT chest on 12/31 showed lobar pneumonia of the left upper lobe with developing bronchopneumonia throughout the remaining portions of the lungs. The previously noted mass like opacity in the subcutaneous segment of the left lower lobe seen on prior study 7/29 has completely resolved.  Acute Encephalopathy / Delirium- improved, almost back to baseline per sister at the bedside on 1/22 - now back on some home psychiatry medications -Brain MRI nondiagnostic, premature for age atrophic, mild white matter signal abnormalities chronic microvascular ischemic changes, no acute intracranial findings. -EEG unrevealing  -TSH 2.928 -complete metabolic w/u reveals elevated ammonia at 83 but is otherwise unremarkable. Patient had significant improvement in mental status really developed after home psych meds were resumed - QTC 575 on admission, now repeat EKG on 1/21 showed QTC improved to 496 - Psychiatry was consulted, recommended continue Prozac 20 mg daily, decrease clonazepam 2.5 mg 3 times a day as needed, increase BuSpar  to 10 mg twice a day   Elevated ammonia -Improved, LFTs normal. Unclear significance.  Hypernatremia  -Improving, likely due to poor intake - patient encouraged to increase oral intake and agrees to do so in order to avoid NG feeding tube - resolved   H/O Depression / Anxiety  -Continuing home med regimen   Deconditioning  -PT recommended CIR, patient accepted to inpatient rehabilitation  Hypotension in pt w/ hx Hypertension -appears to be due to simple DH. Patient received IV fluids.  Pulmonary hypertension -PA pressure 82mHg on TTE - no clinical symptoms presently - hopefully will improve as lungs continue to heal following ARDS  Chronic pain syndrome -H/O chronic Headaches & Chronic Back Pain -Patient reports takes fioricet at home for headaches.  Hypokalemia -Replaced   Morbid Obesity - Body mass index is 41.53 kg/(m^2).  Day of Discharge BP 120/72 mmHg  Pulse 105  Temp(Src) 98.7 F (37.1 C) (Oral)  Resp 18  Ht 5' 7"  (1.702 m)  Wt 110.224 kg (243 lb)  BMI 38.05 kg/m2  SpO2 98%  LMP 07/31/2015  Physical Exam: General: Alert and awake oriented x3 not in any acute distress. HEENT: anicteric  sclera, pupils reactive to light and accommodation CVS: S1-S2 clear no murmur rubs or gallops Chest: clear to auscultation bilaterally, no wheezing rales or rhonchi Abdomen: soft nontender, nondistended, normal bowel sounds Extremities: no cyanosis, clubbing or edema noted bilaterally Neuro: Cranial nerves II-XII intact, no focal neurological deficits   The results of significant diagnostics from this hospitalization (including imaging, microbiology, ancillary and laboratory) are listed below for reference.    LAB RESULTS: Basic Metabolic Panel:  Recent Labs Lab 08/08/15 0907 08/09/15 0755  NA 141 142  K 3.2* 3.5  CL 107 107  CO2 22 22  GLUCOSE 110* 109*  BUN 6 5*  CREATININE 0.62 0.74  CALCIUM 9.7 9.9   Liver Function Tests:  Recent Labs Lab  08/03/15 0445 08/05/15 0504  AST 38 26  ALT 33 28  ALKPHOS 57 48  BILITOT 1.4* 0.8  PROT 7.5 6.6  ALBUMIN 3.8 3.5   No results for input(s): LIPASE, AMYLASE in the last 168 hours.  Recent Labs Lab 08/05/15 0504 08/07/15 0623  AMMONIA 28 22   CBC:  Recent Labs Lab 08/08/15 0907 08/09/15 0755  WBC 10.9* 13.5*  HGB 12.8 13.4  HCT 38.6 40.7  MCV 90.0 91.1  PLT 218 258   Cardiac Enzymes: No results for input(s): CKTOTAL, CKMB, CKMBINDEX, TROPONINI in the last 168 hours. BNP: Invalid input(s): POCBNP CBG:  Recent Labs Lab 08/08/15 0611  GLUCAP 274*    Significant Diagnostic Studies:  Ct Head Wo Contrast  07/17/2015  CLINICAL DATA:  Headache for the last 5 days. Pneumonia. Left-sided lung mass. EXAM: CT HEAD WITHOUT CONTRAST TECHNIQUE: Contiguous axial images were obtained from the base of the skull through the vertex without intravenous contrast. COMPARISON:  None. FINDINGS: Sinuses/Soft tissues: Clear paranasal sinuses and mastoid air cells. Intracranial: No mass lesion, hemorrhage, hydrocephalus, acute infarct, intra-axial, or extra-axial fluid collection. IMPRESSION: Normal head CT. Electronically Signed   By: Abigail Miyamoto M.D.   On: 07/17/2015 16:13   Ct Chest Wo Contrast  07/17/2015  CLINICAL DATA:  45 year old female with headache and weakness for the past 5 days. Worsening shortness of breath. EXAM: CT CHEST WITHOUT CONTRAST TECHNIQUE: Multidetector CT imaging of the chest was performed following the standard protocol without IV contrast. COMPARISON:  Chest CT 02/12/2015. FINDINGS: Mediastinum/Lymph Nodes: Heart size is normal. There is no significant pericardial fluid, thickening or pericardial calcification. No pathologically enlarged mediastinal or hilar lymph nodes. Please note that accurate exclusion of hilar adenopathy is limited on noncontrast CT scans. Esophagus is unremarkable in appearance. No axillary lymphadenopathy. Lungs/Pleura: The previously noted  mass-like opacity in the superior segment of the left lower lobe seen on prior study 02/12/2015 has completely resolved. However, there is now near complete lobar consolidation throughout the left upper lobe, and widespread peribronchovascular ground-glass attenuation, consolidation thickening of the peribronchovascular interstitium throughout the remainder of the lungs bilaterally, indicative of early endobronchial spread of infection. No pleural effusions. Surgical clips in the infrahilar aspect of the medial right lower lobe. Upper Abdomen: Status post cholecystectomy. Musculoskeletal/Soft Tissues: There are no aggressive appearing lytic or blastic lesions noted in the visualized portions of the skeleton. IMPRESSION: 1. Lobar pneumonia of the left upper lobe with developing bronchopneumonia throughout the remaining portions of the lungs, presumably from endobronchial spread of infection. 2. Additional incidental findings, as above. Electronically Signed   By: Vinnie Langton M.D.   On: 07/17/2015 16:34   Dg Chest Port 1 View  07/18/2015  CLINICAL DATA:  45 year old female with central  line placement and pneumonia. EXAM: PORTABLE CHEST 1 VIEW COMPARISON:  Radiograph dated 07/07/2015 FINDINGS: Endotracheal tube remains above the carina enteric tube courses towards the left hemiabdomen. There has been interval placement of the left IJ central line with tip over central SVC. Bilateral diffuse airspace opacities, left greater right again noted. No pneumothorax. Stable cardiac silhouette. The osseous structures appear unremarkable. IMPRESSION: Interval placement of a left IJ central line with tip over central SVC. No pneumothorax. Diffuse bilateral airspace opacities. Electronically Signed   By: Anner Crete M.D.   On: 07/18/2015 04:37   Dg Chest Port 1 View  07/17/2015  CLINICAL DATA:  Patient status post ET tube placement. EXAM: PORTABLE CHEST 1 VIEW COMPARISON:  Chest CT earlier same day FINDINGS: ET tube  terminates in the distal trachea. Enteric tube courses inferior to the diaphragm. Obscured cardiac and mediastinal contours. Diffuse left-greater-than-right pulmonary airspace opacities. No large pleural effusion or pneumothorax. IMPRESSION: ET tube terminates in the distal trachea, recommend retraction. Left-greater-than-right airspace opacities compatible with extensive pneumonia. These results were called by telephone at the time of interpretation on 07/17/2015 at 6:59 pm to Rose Phi, RN, who verbally acknowledged these results. Electronically Signed   By: Lovey Newcomer M.D.   On: 07/17/2015 19:01    2D ECHO:  Study Conclusions  - Left ventricle: The cavity size was normal. Systolic function was normal. Wall motion was normal; there were no regional wall motion abnormalities. Doppler parameters are consistent with abnormal left ventricular relaxation (grade 1 diastolic dysfunction). - Aortic valve: Trileaflet; normal thickness leaflets. There was no regurgitation. - Mitral valve: Structurally normal valve. - Left atrium: The atrium was normal in size. - Right ventricle: The cavity size was mildly dilated. Wall thickness was normal. - Right atrium: The atrium was normal in size. - Tricuspid valve: There was mild regurgitation. - Pulmonary arteries: Systolic pressure was moderately increased. PA peak pressure: 47 mm Hg (S). - Inferior vena cava: The vessel was normal in size. - Pericardium, extracardiac: There was no pericardial effusion. Disposition and Follow-up:    DISPOSITION: Inpatient rehabilitation   DISCHARGE FOLLOW-UP Follow-up Information    Follow up with Marcial Pacas, DO. Schedule an appointment as soon as possible for a visit in 2 weeks.   Specialty:  Family Medicine   Why:  for hospital follow-up   Contact information:   [place] [id number] [phone]        Time spent on Discharge: 45 minutes Signed:   Suda Forbess  M.D. [facility] 08/09/2015, 12:56 PM Pager: [phone]

## 2015-08-09 NOTE — Interval H&P Note (Signed)
Cynthia Murray was admitted today to Inpatient Rehabilitation with the diagnosis of debility secondary to encephalopathy.  The patient's history has been reviewed, patient examined, and there is no change in status.  Patient continues to be appropriate for intensive inpatient rehabilitation.  I have reviewed the patient's chart and labs.  Questions were answered to the patient's satisfaction. The PAPE has been reviewed and assessment remains appropriate.  Ankit Lorie Phenix 08/09/2015, 4:52 PM
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
## 2015-08-09 NOTE — Progress Notes (Signed)
Calorie Count Note  48 hour calorie count ordered.  Diet: regular Supplements: Glucerna Shake po BID, each supplement provides 220 kcal and 10 grams of protein  No meal tickets retained/No information provided Breakfast: N/A Lunch: N/A Dinner: N/A Supplements: 440 kcal, 20 grams of protein.   Total intake: (insufficent data to calculate results) N/A kcal  N/A protein   No meal tickets were saved, thus insufficient information provided to calculate calorie count results. Most recent meal completion however has been 75-100%. Appetite fine. Pt reports she is able to eat well if she receives the foods she likes. Pt encouraged to order foods she prefers to eat at meals for adequate PO intake. Pt currently has Glucerna Shake ordered and has been consuming them. RD to continue with current orders. Plans to discharge to CIR.   Nutrition Dx: Inadequate oral intake related to lethargy/confusion as evidenced by meal completion < 50%; improved  Goal:  Pt to meet >/= 90% of their estimated nutrition needs; met  Intervention:   Continue Glucerna Shake po BID, each supplement provides 220 kcal and 10 grams of protein.  Encourage adequate PO intake.   Corrin Parker, MS, RD, LDN Pager # [phone] After hours/ weekend pager # [phone]

## 2015-08-09 NOTE — Progress Notes (Signed)
Meredith Staggers, MD Physician Signed Physical Medicine and Rehabilitation Consult Note 07/29/2015 2:32 PM  Related encounter: Admission (Current) from 07/17/2015 in [facility]        Physical Medicine and Rehabilitation Consult Reason for Consult: Acute hypoxic respiratory failure/septic shock Referring Physician: Critical care   HPI: Cynthia Murray is a 45 y.o. right handed female with history of hypertension. Presented to [facility] 07/17/2015 with dyspnea, cough, chest pain and headache. Noted to be hypotensive. Labs showed lactic acidosis and AKI. Placed on broad-spectrum antibiotics. Started on pressors. CT of the chest showed lobar pneumonia of the left upper lobe with developing bronchial pneumonia throughout the remaining portions of the lungs. Cranial CT scan negative. Blood cultures negative. Patient transferred to [facility] for ongoing care. Patient did require intubation and extubated 07/28/2015. Bouts of confusion/restlessness suspect acute encephalopathy in setting of respiratory failure. Mechanical soft nectar thick liquid diet. Subcutaneous heparin for DVT prophylaxis. Physical therapy evaluation completed with recommendations of physical medicine rehabilitation consult.   Review of Systems  Unable to perform ROS: mental acuity  All other systems reviewed and are negative.  Past Medical History  Diagnosis Date  . Hypertension    Past Surgical History  Procedure Laterality Date  . Hand surgery  1994    left hand  . Removal of cyst between lungs  1998  . Appendectomy  2004  . Cholecystectomy  2004   Family History  Problem Relation Age of Onset  . Breast cancer      grandmother  . Heart attack Father   . Depression Mother     father  . Diabetes Father   . Hyperlipidemia Father   . Hypertension Father    Social  History:  reports that she has been smoking Cigarettes. She has a 15 pack-year smoking history. She does not have any smokeless tobacco history on file. She reports that she drinks alcohol. She reports that she does not use illicit drugs. Allergies: No Known Allergies Medications Prior to Admission  Medication Sig Dispense Refill  . amitriptyline (ELAVIL) 150 MG tablet Take 50-150 mg by mouth at bedtime as needed.     . Aspirin-Acetaminophen-Caffeine (GOODY HEADACHE PO) Take 1 packet by mouth 2 (two) times daily as needed (pain).    . busPIRone (BUSPAR) 10 MG tablet Take 1 tablet by mouth twice daily (Patient taking differently: Take 10 mg by mouth 2 (two) times daily. ) 60 tablet 5  . clonazePAM (KLONOPIN) 1 MG tablet Take 1 tablet (1 mg total) by mouth 3 (three) times daily as needed. for anxiety (Patient taking differently: Take 1 mg by mouth 3 (three) times daily as needed for anxiety. ) 90 tablet 3  . Dextromethorphan Polistirex (DELSYM PO) Take 10 mLs by mouth every 4 (four) hours as needed (cough).    . diphenhydrAMINE (BENADRYL) 25 mg capsule Take 50 mg by mouth at bedtime.     Marland Kitchen FLUoxetine (PROZAC) 20 MG capsule TAKE TWO CAPSULES BY MOUTH IN THE MORNING AND TAKE TWO CAPSULES IN THE EVENING 120 capsule 1  . gabapentin (NEURONTIN) 300 MG capsule Take 900 mg by mouth 4 (four) times daily.     . hydrochlorothiazide (HYDRODIURIL) 25 MG tablet One tablet by mouth every morning as needed for swelling. (Patient taking differently: Take 25 mg by mouth daily as needed (feet swelling). ) 30 tablet 0  . ibuprofen (ADVIL,MOTRIN) 200 MG tablet Take 400 mg by  mouth every 12 (twelve) hours.    . medroxyPROGESTERone (DEPO-PROVERA) 150 MG/ML injection Inject 150 mg into the muscle every 3 (three) months. Last injection 06/22/15    . meloxicam (MOBIC) 15 MG tablet Take 1 tablet (15 mg total) by mouth daily. For back. 30 tablet 4  . metoprolol  tartrate (LOPRESSOR) 25 MG tablet Take 2 tablets (50 mg total) by mouth 2 (two) times daily. (Patient taking differently: Take 25 mg by mouth 2 (two) times daily. Headache prophylactic) 60 tablet 5  . Pseudoeph-Doxylamine-DM-APAP (NYQUIL PO) Take 30 mLs by mouth every 4 (four) hours as needed (cough and cold symptoms).    . ranitidine (ZANTAC) 75 MG tablet Take 75 mg by mouth 2 (two) times daily.     . tizanidine (ZANAFLEX) 6 MG capsule Take 4-6 mg by mouth 3 (three) times daily.     . traMADol (ULTRAM) 50 MG tablet Take 1 tablet (50 mg total) by mouth every 6 (six) hours as needed. (Patient taking differently: Take 50 mg by mouth every 6 (six) hours as needed (pain). ) 90 tablet 0    Home: Home Living Family/patient expects to be discharged to:: Private residence Available Help at Discharge: (no family present to confirm)  Functional History: Prior Function Level of Independence: Independent Functional Status:  Mobility: Bed Mobility Overal bed mobility: Needs Assistance Bed Mobility: Supine to Sit, Sit to Supine Supine to sit: Max assist, HOB elevated Sit to supine: Mod assist Sit to sidelying: Max assist, +2 for physical assistance General bed mobility comments: Assist with BLEs, to elevate trunk and scoot bottom to EOB. Step by step cues for technique. Assist to bring BLEs into bed. Uncontrolled descent into bed. Transfers General transfer comment: stand not attempted as pt reports fatigue, increased WOB and elevated HR.      ADL:    Cognition: Cognition Overall Cognitive Status: No family/caregiver present to determine baseline cognitive functioning Orientation Level: Oriented to person, Oriented to place, Disoriented to time, Disoriented to situation Cognition Arousal/Alertness: Awake/alert Behavior During Therapy: Anxious Overall Cognitive Status: No family/caregiver present to determine baseline cognitive functioning Area of Impairment:  Problem solving, Following commands Problem Solving: Slow processing, Decreased initiation, Requires verbal cues Difficult to assess due to: Intubated  Blood pressure 111/84, pulse 117, temperature 99.3 F (37.4 C), temperature source Oral, resp. rate 22, height 5' 7"  (1.702 m), weight 120.3 kg (265 lb 3.4 oz), SpO2 94 %. Physical Exam  Constitutional:  45 year old right-handed obese female  HENT:  Head: Normocephalic.  Eyes: EOM are normal. Pupils are equal, round, and reactive to light.  Neck: Normal range of motion. Neck supple. No JVD present. No tracheal deviation present. No thyromegaly present.  Cardiovascular: Regular rhythm.  tachycardic  Respiratory: No respiratory distress.  Decreased breath sounds at the bases  GI: Soft. Bowel sounds are normal. She exhibits no distension.  Neurological: She is alert.  Pt makes little eye contact. Uttering something about a "kid"---follows simple one step commands less than 25%. Would not answer any biographical information. Makes weak attempt to grasp with fingers and wiggle toes. Does not withdraw to pain provocation.  Skin: Skin is warm and dry.  Psychiatric:  Flat, appears depressed, very confused.     Lab Results Last 24 Hours    Results for orders placed or performed during the hospital encounter of 07/17/15 (from the past 24 hour(s))  Magnesium Status: None   Collection Time: 07/29/15 3:30 AM  Result Value Ref Range   Magnesium 2.2 1.7 -  2.4 mg/dL  Phosphorus Status: None   Collection Time: 07/29/15 3:30 AM  Result Value Ref Range   Phosphorus 4.3 2.5 - 4.6 mg/dL  Basic metabolic panel Status: Abnormal   Collection Time: 07/29/15 3:30 AM  Result Value Ref Range   Sodium 137 135 - 145 mmol/L   Potassium 4.0 3.5 - 5.1 mmol/L   Chloride 100 (L) 101 - 111 mmol/L   CO2 26 22 - 32 mmol/L   Glucose, Bld 116 (H) 65 - 99 mg/dL   BUN 18 6 - 20 mg/dL    Creatinine, Ser 0.48 0.44 - 1.00 mg/dL   Calcium 9.7 8.9 - 10.3 mg/dL   GFR calc non Af Amer >60 >60 mL/min   GFR calc Af Amer >60 >60 mL/min   Anion gap 11 5 - 15  CBC Status: Abnormal   Collection Time: 07/29/15 3:30 AM  Result Value Ref Range   WBC 15.6 (H) 4.0 - 10.5 K/uL   RBC 4.32 3.87 - 5.11 MIL/uL   Hemoglobin 12.8 12.0 - 15.0 g/dL   HCT 38.2 36.0 - 46.0 %   MCV 88.4 78.0 - 100.0 fL   MCH 29.6 26.0 - 34.0 pg   MCHC 33.5 30.0 - 36.0 g/dL   RDW 14.1 11.5 - 15.5 %   Platelets 455 (H) 150 - 400 K/uL      Imaging Results (Last 48 hours)    Dg Chest Port 1 View  07/28/2015 CLINICAL DATA: Respiratory failure, pneumonia, sepsis, ARDS. EXAM: PORTABLE CHEST 1 VIEW COMPARISON: Portable chest x-ray of July 27, 2015 FINDINGS: The lungs are slightly better inflated today. Aeration of the left upper lung has improved. There is persistent interstitial density at the right lung base. On the left the mildly increased airspace densities persists. There remains partial obscuration of the left hemidiaphragm. A left pleural effusion cannot be excluded. The cardiac silhouette is top-normal in size. The pulmonary vascularity is not engorged. The endotracheal tube tip lies 5.6 cm above the carina. The esophagogastric tube tip projects below the inferior margin of the image. IMPRESSION: Persistent bibasilar and left perihilar atelectasis or pneumonia. There may be a left pleural effusion layering posteriorly. There is no significant pulmonary edema. Electronically Signed By: David Martinique M.D. On: 07/28/2015 08:20   Dg Abd Portable 1v  07/27/2015 CLINICAL DATA: Encounter for orogastric tube placement. EXAM: PORTABLE ABDOMEN - 1 VIEW COMPARISON: Abdominal radiograph 07/19/2015 FINDINGS: Tip and side port of the enteric tube below the diaphragm in the stomach, the tip is directed towards the fundus. No dilated bowel loops in the upper  abdomen. Air seen within the colon. IMPRESSION: Tip and side port of the enteric tube below the diaphragm in the stomach, tip directed towards the fundus. Electronically Signed By: Jeb Levering M.D. On: 07/27/2015 19:22     Assessment/Plan: Diagnosis: encephalopathy, critical illness myopathy 1. Does the need for close, 24 hr/day medical supervision in concert with the patient's rehab needs make it unreasonable for this patient to be served in a less intensive setting? Yes 2. Co-Morbidities requiring supervision/potential complications: ARDS, dysphagia, depression/anxiety 3. Due to bladder management, bowel management, safety, skin/wound care, disease management, medication administration, pain management and patient education, does the patient require 24 hr/day rehab nursing? Yes 4. Does the patient require coordinated care of a physician, rehab nurse, PT (1-2 hrs/day, 5 days/week), OT (1-2 hrs/day, 5 days/week) and SLP (1-2 hrs/day, 5 days/week) to address physical and functional deficits in the context of the above medical diagnosis(es)? Yes Addressing  deficits in the following areas: balance, endurance, locomotion, strength, transferring, bowel/bladder control, bathing, dressing, feeding, grooming, toileting, cognition, speech, language, swallowing and psychosocial support 5. Can the patient actively participate in an intensive therapy program of at least 3 hrs of therapy per day at least 5 days per week? Potentially 6. The potential for patient to make measurable gains while on inpatient rehab is good 7. Anticipated functional outcomes upon discharge from inpatient rehab are supervision and min assist with PT, supervision, min assist and mod assist with OT, supervision and min assist with SLP. 8. Estimated rehab length of stay to reach the above functional goals is: 18-22 days 9. Does the patient have adequate social supports and living environment to accommodate these discharge  functional goals? Yes and Potentially 10. Anticipated D/C setting: Home 11. Anticipated post D/C treatments: HH therapy and Outpatient therapy 12. Overall Rehab/Functional Prognosis: good  RECOMMENDATIONS: This patient's condition is appropriate for continued rehabilitative care in the following setting: CIR Patient has agreed to participate in recommended program. N/A Note that insurance prior authorization may be required for reimbursement for recommended care.  Comment: Will need 24 hr assistance from one of sisters upon dc to home. Rehab Admissions Coordinator to follow up.  Thanks,  Meredith Staggers, MD, [facility]     07/29/2015       Revision History     Date/Time User Provider Type Action   07/30/2015 10:37 AM Meredith Staggers, MD Physician Sign   07/29/2015 2:50 PM Cathlyn Parsons, PA-C Physician Assistant Pend   View Details Report       Routing History     Date/Time From To Method   07/30/2015 10:37 AM Meredith Staggers, MD Meredith Staggers, MD In Basket   07/30/2015 10:37 AM Meredith Staggers, MD Marcial Pacas, DO In Basket
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
## 2015-08-09 NOTE — Progress Notes (Signed)
Rehab admissions - Patient is medically ready for rehab today.  Bed available and will admit to acute inpatient rehab today.  Call me for questions.  #[phone]

## 2015-08-10 ENCOUNTER — Inpatient Hospital Stay (HOSPITAL_COMMUNITY): Payer: MEDICAID

## 2015-08-10 ENCOUNTER — Inpatient Hospital Stay (HOSPITAL_COMMUNITY): Payer: MEDICAID | Admitting: Physical Therapy

## 2015-08-10 ENCOUNTER — Inpatient Hospital Stay (HOSPITAL_COMMUNITY): Payer: MEDICAID | Admitting: Speech Pathology

## 2015-08-10 DIAGNOSIS — T7840XA Allergy, unspecified, initial encounter: Secondary | ICD-10-CM | POA: Diagnosis present

## 2015-08-10 DIAGNOSIS — T7840XS Allergy, unspecified, sequela: Secondary | ICD-10-CM

## 2015-08-10 DIAGNOSIS — E876 Hypokalemia: Secondary | ICD-10-CM

## 2015-08-10 LAB — COMPREHENSIVE METABOLIC PANEL
ALBUMIN: 3.6 g/dL (ref 3.5–5.0)
ALT: 33 U/L (ref 14–54)
ANION GAP: 13 (ref 5–15)
AST: 26 U/L (ref 15–41)
Alkaline Phosphatase: 51 U/L (ref 38–126)
BILIRUBIN TOTAL: 0.4 mg/dL (ref 0.3–1.2)
BUN: 6 mg/dL (ref 6–20)
CHLORIDE: 105 mmol/L (ref 101–111)
CO2: 22 mmol/L (ref 22–32)
Calcium: 9.4 mg/dL (ref 8.9–10.3)
Creatinine, Ser: 0.63 mg/dL (ref 0.44–1.00)
GFR calc Af Amer: >60 mL/min (ref >60)
GFR calc non Af Amer: >60 mL/min (ref >60)
GLUCOSE: 115 mg/dL — AB (ref 65–99)
POTASSIUM: 3.1 mmol/L — AB (ref 3.5–5.1)
SODIUM: 140 mmol/L (ref 135–145)
TOTAL PROTEIN: 6.3 g/dL — AB (ref 6.5–8.1)

## 2015-08-10 LAB — CBC WITH DIFFERENTIAL/PLATELET
BASOS ABS: 0.1 10*3/uL (ref 0.0–0.1)
BASOS PCT: 1 %
EOS ABS: 1.4 10*3/uL — AB (ref 0.0–0.7)
EOS PCT: 16 %
HEMATOCRIT: 35.4 % — AB (ref 36.0–46.0)
Hemoglobin: 11.7 g/dL — ABNORMAL LOW (ref 12.0–15.0)
LYMPHS ABS: 3.8 10*3/uL (ref 0.7–4.0)
LYMPHS PCT: 41 %
MCH: 30.2 pg (ref 26.0–34.0)
MCHC: 33.1 g/dL (ref 30.0–36.0)
MCV: 91.2 fL (ref 78.0–100.0)
MONO ABS: 0.7 10*3/uL (ref 0.1–1.0)
Monocytes Relative: 7 %
Neutro Abs: 3.2 10*3/uL (ref 1.7–7.7)
Neutrophils Relative %: 35 %
PLATELETS: 184 10*3/uL (ref 150–400)
RBC: 3.88 MIL/uL (ref 3.87–5.11)
RDW: 14.8 % (ref 11.5–15.5)
WBC: 9.3 10*3/uL (ref 4.0–10.5)

## 2015-08-10 LAB — AMMONIA: Ammonia: 16 umol/L (ref 9–35)

## 2015-08-10 MED ORDER — PROPRANOLOL HCL 10 MG PO TABS
10.0000 mg | ORAL_TABLET | Freq: Three times a day (TID) | ORAL | Status: DC
  Administered 2015-08-10 – 2015-08-17 (×23): 10 mg via ORAL
  Filled 2015-08-10 (×23): qty 1

## 2015-08-10 MED ORDER — POTASSIUM CHLORIDE 20 MEQ PO PACK: 20.0000 meq | PACK | Freq: Two times a day (BID) | ORAL | Status: DC

## 2015-08-10 MED ORDER — DIPHENHYDRAMINE-ZINC ACETATE 2-0.1 % EX CREA
TOPICAL_CREAM | Freq: Two times a day (BID) | CUTANEOUS | Status: DC | PRN
  Filled 2015-08-10: qty 28

## 2015-08-10 MED ORDER — POTASSIUM CHLORIDE CRYS ER 20 MEQ PO TBCR
20.0000 meq | EXTENDED_RELEASE_TABLET | Freq: Two times a day (BID) | ORAL | Status: AC
Start: 2015-08-10 — End: 2015-08-10
  Administered 2015-08-10 (×2): 20 meq via ORAL
  Filled 2015-08-10 (×2): qty 1

## 2015-08-10 MED ORDER — TRIAMCINOLONE ACETONIDE 0.5 % EX OINT
TOPICAL_OINTMENT | Freq: Three times a day (TID) | CUTANEOUS | Status: DC
  Administered 2015-08-10: 10:00:00 via TOPICAL
  Administered 2015-08-10 (×2): 1 via TOPICAL
  Administered 2015-08-11 – 2015-08-17 (×18): via TOPICAL
  Filled 2015-08-10 (×2): qty 15

## 2015-08-10 MED ORDER — POTASSIUM CHLORIDE CRYS ER 20 MEQ PO TBCR: 20.0000 meq | EXTENDED_RELEASE_TABLET | Freq: Two times a day (BID) | ORAL | Status: DC

## 2015-08-10 NOTE — Progress Notes (Signed)
Patient information reviewed and entered into eRehab system by Claude Swendsen, RN, CRRN, PPS Coordinator.  Information including medical coding and functional independence measure will be reviewed and updated through discharge.    

## 2015-08-10 NOTE — Progress Notes (Signed)
[facility] PHYSICAL MEDICINE & REHABILITATION     PROGRESS NOTE  Subjective/Complaints:  Pt seen laying in bed this AM.  She did not sleep well overnight because she was anxious and restless.  She also notes itching to her LUE.    ROS: +Anxious, pruritis.  Denies CP, SOB, n/v/d.   Objective: Vital Signs: Blood pressure 116/70, pulse 105, temperature 98 F (36.7 C), temperature source Oral, resp. rate 20, weight 109.725 kg (241 lb 14.4 oz), last menstrual period 07/31/2015, SpO2 100 %. No results found.  Recent Labs  08/09/15 0755 08/10/15 0700  WBC 13.5* 9.3  HGB 13.4 11.7*  HCT 40.7 35.4*  PLT 258 184    Recent Labs  08/09/15 0755 08/10/15 0700  NA 142 140  K 3.5 3.1*  CL 107 105  GLUCOSE 109* 115*  BUN 5* 6  CREATININE 0.74 0.63  CALCIUM 9.9 9.4   CBG (last 3)   Recent Labs  08/08/15 0611  GLUCAP 274*    Wt Readings from Last 3 Encounters:  08/09/15 109.725 kg (241 lb 14.4 oz)  08/07/15 110.224 kg (243 lb)  06/22/15 119.296 kg (263 lb)    Physical Exam:  BP 116/70 mmHg  Pulse 105  Temp(Src) 98 F (36.7 C) (Oral)  Resp 20  Wt 109.725 kg (241 lb 14.4 oz)  SpO2 100%  LMP 07/31/2015 Constitutional: She appears well-developed and well-nourished. NAD. HENT: Normocephalic and atraumatic.  Eyes: Conjunctivae and EOM are normal. No scleral icterus.  Cardiovascular: Regular rhythm. Tachycardia present.  Respiratory: Effort normal and breath sounds normal. No respiratory distress. She has no wheezes.  GI: Soft. Bowel sounds are normal. She exhibits no distension. There is no tenderness.  Musculoskeletal: No edema.  No tenderness. Neurological: She is alert and oriented. Ataxic rambling speech   Temors noted   She is able to follow simple motor commands.  Motor: B/l UE 4/5 proximal to distal B/l LE 4/5 proximally, 4+/5 distally  Skin: Skin is warm and dry.  Left forearm with multiple erythematous round dime size macular areas.  Psychiatric: Her  affect is inappropriate. Her speech is tangential. Cognition and memory are impaired. She expresses restlessness.   Assessment/Plan: 1. Functional deficits secondary to encephalopathy/debility which require 3+ hours per day of interdisciplinary therapy in a comprehensive inpatient rehab setting. Physiatrist is providing close team supervision and 24 hour management of active medical problems listed below. Physiatrist and rehab team continue to assess barriers to discharge/monitor patient progress toward functional and medical goals.  Function:  Bathing Bathing position      Bathing parts      Bathing assist        Upper Body Dressing/Undressing Upper body dressing                    Upper body assist        Lower Body Dressing/Undressing Lower body dressing                                  Lower body assist        Toileting Toileting   Toileting steps completed by patient: Adjust clothing prior to toileting, Performs perineal hygiene, Adjust clothing after toileting   Toileting Assistive Devices: Grab bar or rail  Toileting assist Assist level: Supervision or verbal cues   Transfers Chair/bed transfer             Locomotion Ambulation  Wheelchair          Cognition Comprehension Comprehension assist level: Follows basic conversation/direction with extra time/assistive device  Expression Expression assist level: Expresses basic needs/ideas: With extra time/assistive device  Social Interaction Social Interaction assist level: Interacts appropriately with others with medication or extra time (anti-anxiety, antidepressant).  Problem Solving Problem solving assist level: Solves basic problems with no assist  Memory      Medical Problem List and Plan: 1. Cognitive deficits, delayed processing, and weakness secondary to encephalopathy/debility.  Begin CIR 2. DVT Prophylaxis/Anticoagulation: Pharmaceutical: Lovenox 3. Chronic  back pain/Pain Management: Used tramadol prn with muscle relaxers.  4. Mood: LCSW to follow for evaluation and support.  5. Neuropsych: This patient is not fully capable of making decisions on her own behalf. 6. Skin/Wound Care:   Marker allergy - monitor lesions on left forearm.   Benadryl ordered 1/24 7. Fluids/Electrolytes/Nutrition: Monitor I/O. Po intake improving.   Hypokalemia: Repleted 1/24.  Will cont to monitor periodically.  8. Leucocytosis:   WNL on 1/24.   Will cont to monitor periodically 9. Anxiety d/o with depression:   Continue Prozac 20 mg with buspar bid.  10. Migranes: on fiorecet prn. 11. Encephalopathy: Ammonia WNL on 1/24. 12. Tachycardia/tremors:   Likely due to deconditioning.   Cont low dose BB.   LOS (Days) 1 A FACE TO FACE EVALUATION WAS PERFORMED  Arisbeth Purrington Lorie Phenix 08/10/2015 8:19 AM

## 2015-08-10 NOTE — Evaluation (Addendum)
Speech Language Pathology Assessment and Plan  Patient Details  Name: Cynthia Murray MRN: [id number] Date of Birth: 12/17/1970  SLP Diagnosis: Cognitive Impairments  Rehab Potential: Excellent ELOS: 7 days    Today's Date: 08/10/2015 SLP Individual Time: 1300-1400 SLP Individual Time Calculation (min): 60 min   Problem List:  Patient Active Problem List   Diagnosis Date Noted  . Allergic reaction   . Encephalopathy, metabolic 18/29/9371  . Debility   . Chronic back pain   . Leukocytosis   . Adjustment disorder with mixed anxiety and depressed mood   . Headache, migraine   . Tachycardia   . Tremor   . Generalized anxiety disorder 08/08/2015  . Panic disorder without agoraphobia 08/08/2015  . Major depressive disorder, recurrent episode ([place]) 08/08/2015  . Acute encephalopathy   . Acute delirium   . Anxiety state   . Essential hypertension   . Hypokalemia   . Septic shock ([place])   . CAP (community acquired pneumonia)   . Pulmonary hypertension ([place])   . Chronic pain syndrome   . Critical illness myopathy   . Pneumococcal lobar pneumonia ([place])   . ARDS (adult respiratory distress syndrome) ([place])   . Encounter for tube feeding instruction   . Pneumonia 07/17/2015  . Sepsis ([place]) 07/17/2015  . AKI (acute kidney injury) ([place]) 07/17/2015  . Headache   . Endotracheally intubated   . Lobar pneumonia ([place])   . Acute respiratory failure with hypoxia ([place])   . Lung mass 02/11/2015  . Right ankle pain 05/14/2014  . Left lumbar radiculopathy 05/14/2014  . Essential hypertension, benign 12/11/2013  . Chronic pain 12/11/2013  . Anxiety 12/10/2013  . Depression 12/10/2013  . Migraine 12/10/2013   Past Medical History:  Past Medical History  Diagnosis Date  . Hypertension    Past Surgical History:  Past Surgical History  Procedure Laterality Date  . Hand surgery  1994    left hand  . Removal of cyst between lungs  1998  . Appendectomy  2004  . Cholecystectomy   2004    Assessment / Plan / Recommendation Clinical Impression   Cynthia Murray is a 45 y.o. right handed female with history of hypertension, chronic pain, depression, anxiety disorder who was admitted via [facility] on 07/17/2015 with dyspnea, cough, chest pain and headache. She was noted to have lactic acidosis with hypotension and AKI. CT of the chest showed lobar pneumonia of the left upper lobe with developing bronchial pneumonia throughout the remaining portions of the lungs. Cranial CT scan negative. She was started on IV antibiotics, pressors and intubated due to ARDS. She has had issues with delirium and EEG done showing global cerebral dysfunction consistent with non-specific encephalopathy. Blood cultures and urine cultures negative. She tolerated extubation on 07/28/2015 but developed paranoia with confused language. Ammonia levels elevated at 83 with sodium 150. Neurology felt that changes were due to metabolic derangement and should improve with correction of abnormality. MRI brain negative for acute changes and psychiatry consulted for input on management of anxiety, depression and panic disorder. Dr. Darleene Cleaver has been following for supportive therapy and Buspar was increased to 10 mg bid with recommendations to decrease Klonopin use. She continues to have cognitive deficits with delayed processing, poor safety awareness and requires multimodal cues for transfers. Therapy ongoing and CIR recommended due to debilitated state.   Skilled Therapeutic Interventions          Pt participated with the [facility]) and scored 26/30 with primary  deficits resulting from self-distraction and decreased working memory. Pt required increased time and frequent repetition of questions to complete functional math word problems with 3/6 acc. Mental fatigue and self-distraction again appeared to be complicating factors.   SLP Assessment  Patient will need skilled Speech  [place] Pathology Services during CIR admission    Recommendations  Follow up Recommendations: Other (comment) (pending progress in CIR) Equipment Recommended: None recommended by SLP    SLP Frequency 3 to 5 out of 7 days   SLP Duration  SLP Intensity  SLP Treatment/Interventions 7 days  Minumum of 1-2 x/day, 30 to 90 minutes  Cognitive remediation/compensation;Therapeutic Activities;Functional tasks;Patient/family education    Pain Pain Assessment Pain Assessment: No/denies pain  Prior Functioning Cognitive/Linguistic Baseline: Within functional limits  Lives With: Family  Function:  Eating Eating   Modified Consistency Diet: No Eating Assist Level: No help, No cues           Cognition Comprehension Comprehension assist level: Follows basic conversation/direction with no assist  Expression   Expression assist level: Expresses complex 90% of the time/cues < 10% of the time  Social Interaction Social Interaction assist level: Interacts appropriately 90% of the time - Needs monitoring or encouragement for participation or interaction.  Problem Solving Problem solving assist level: Solves basic problems with no assist  Memory Memory assist level: Recognizes or recalls 75 - 89% of the time/requires cueing 10 - 24% of the time   Short Term Goals: Week 1: SLP Short Term Goal 1 (Week 1): Pt to complete moderate level functional math tasks at mod I level due to increased time.  SLP Short Term Goal 2 (Week 1): Pt to complete functional reasoning activities such as deductive reasoning/scheduling at mod I level with compensatory strategies. SLP Short Term Goal 3 (Week 1): Pt to demonstrate alternating and divided attention adequate for successful completion of functional activity with min A.  SLP Short Term Goal 4 (Week 1): Pt to demonstrate reading comprehension at 100% acc at the paragraph to functional level at mod I level.  Refer to Care Plan for Long Term  Goals  Recommendations for other services: None  Discharge Criteria: Patient will be discharged from SLP if patient refuses treatment 3 consecutive times without medical reason, if treatment goals not met, if there is a change in medical status, if patient makes no progress towards goals or if patient is discharged from hospital.  The above assessment, treatment plan, treatment alternatives and goals were discussed and mutually agreed upon: by patient  Vinetta Bergamo 08/10/2015, 4:10 PM

## 2015-08-10 NOTE — IPOC Note (Signed)
Overall Plan of Care [facility]) Patient Details Name: Cynthia Murray MRN: [id number] DOB: [birth year]  Admitting Diagnosis: Debility/encephalopathy  Hospital Problems: Active Problems:   Encephalopathy, metabolic   Debility   Chronic back pain   Leukocytosis   Adjustment disorder with mixed anxiety and depressed mood   Headache, migraine   Tachycardia   Tremor   Allergic reaction    Functional Problem List: Nursing Edema, Pain, Safety  PT Balance, Behavior, Endurance, Motor, Nutrition, Pain, Safety  OT Balance, Cognition, Endurance, Safety  SLP Cognition  TR         Basic ADL's: OT Grooming, Bathing, Dressing, Toileting     Advanced  ADL's: OT Simple Meal Preparation, Light Housekeeping     Transfers: PT Bed Mobility, Bed to Chair, Car, Manufacturing systems engineer, Metallurgist: PT Ambulation, Emergency planning/management officer, Stairs     Additional Impairments: OT None  SLP Social Cognition   Problem Solving, Attention, Social Interaction  TR      Anticipated Outcomes Item Anticipated Outcome  Self Feeding Mod I  Swallowing      Basic self-care  Mod I  Toileting  Mod I   Bathroom Transfers Supervision  Bowel/Bladder  Mod I  Transfers  mod I  Locomotion  supervision  Communication  mod I for complex communication  Cognition  mod I for complex tasks  Pain  <3  Safety/Judgment  Supervision   Therapy Plan: PT Intensity: Minimum of 1-2 x/day ,45 to 90 minutes PT Frequency: 5 out of 7 days PT Duration Estimated Length of Stay: 7-10 days OT Intensity: Minimum of 1-2 x/day, 45 to 90 minutes OT Frequency: 5 out of 7 days OT Duration/Estimated Length of Stay: 7-10 days SLP Intensity: Minumum of 1-2 x/day, 30 to 90 minutes SLP Frequency: 3 to 5 out of 7 days SLP Duration/Estimated Length of Stay: 7 days       Team Interventions: Nursing Interventions Psychosocial Support, Disease Management/Prevention  PT interventions Ambulation/gait training,  Training and development officer, Cognitive remediation/compensation, Community reintegration, Discharge planning, DME/adaptive equipment instruction, Functional mobility training, Neuromuscular re-education, Pain management, Patient/family education, Psychosocial support, Therapeutic Exercise, Therapeutic Activities, Stair training, UE/LE Strength taining/ROM, UE/LE Coordination activities, Wheelchair propulsion/positioning  OT Interventions Discharge planning, Training and development officer, Cognitive remediation/compensation, DME/adaptive equipment instruction, UE/LE Strength taining/ROM, Therapeutic Exercise, Therapeutic Activities, Self Care/advanced ADL retraining, Patient/family education, Functional mobility training  SLP Interventions Cognitive remediation/compensation, Therapeutic Activities, Functional tasks, Patient/family education  TR Interventions    SW/CM Interventions Discharge Planning, Psychosocial Support, Patient/Family Education    Team Discharge Planning: Destination: PT-Home ,OT- Home , SLP-  Projected Follow-up: PT-Home health PT, 24 hour supervision/assistance, OT-  Outpatient OT, SLP-Other (comment) (pending progress in CIR) Projected Equipment Needs: PT-To be determined, OT- To be determined, SLP-None recommended by SLP Equipment Details: PT- , OT-  Patient/family involved in discharge planning: PT- Patient,  OT-Patient, SLP-Patient  MD ELOS: 7-11 days. Medical Rehab Prognosis:  Excellent Assessment: 45 y.o. right handed female with history of hypertension, chronic pain, depression, anxiety disorder who was admitted via [facility] on 07/17/2015 with dyspnea, cough, chest pain and headache. She was noted to have lactic acidosis with hypotension and AKI. CT of the chest showed lobar pneumonia of the left upper lobe with developing bronchial pneumonia throughout the remaining portions of the lungs. Cranial CT scan negative. She was started on IV antibiotics, pressors and  intubated due to ARDS. She has had issues with delirium and EEG done showing global cerebral dysfunction consistent with non-specific encephalopathy.  She tolerated extubation on 07/28/2015 but developed paranoia with confused language. Ammonia levels elevated at 83 with sodium 150. Neurology felt that changes were due to metabolic derangement and should improve with correction of abnormality. MRI brain negative for acute changes and psychiatry consulted for input on management of anxiety, depression and panic disorder. Dr. Darleene Cleaver has been following for supportive therapy and Buspar was increased to 10 mg bid with recommendations to decrease Klonopin use. She continues to have cognitive deficits with delayed processing, poor safety awareness.   See Team Conference Notes for weekly updates to the plan of care
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
## 2015-08-10 NOTE — Plan of Care (Signed)
Problem: RH PAIN MANAGEMENT Goal: RH STG PAIN MANAGED AT OR BELOW PT'S PAIN GOAL <5  Outcome: Progressing No c/o pain

## 2015-08-10 NOTE — Evaluation (Signed)
Physical Therapy Assessment and Plan  Patient Details  Name: Cynthia Murray MRN: [id number] Date of Birth: [birth year]  PT Diagnosis: Abnormality of gait, Cognitive deficits and Muscle weakness Rehab Potential: Good ELOS: 7-10 days   Today's Date: 08/10/2015 PT Individual Time: 0900-1000 PT Individual Time Calculation (min): 60 min    Problem List:  Patient Active Problem List   Diagnosis Date Noted  . Allergic reaction   . Encephalopathy, metabolic 38/88/2800  . Debility   . Chronic back pain   . Leukocytosis   . Adjustment disorder with mixed anxiety and depressed mood   . Headache, migraine   . Tachycardia   . Tremor   . Generalized anxiety disorder 08/08/2015  . Panic disorder without agoraphobia 08/08/2015  . Major depressive disorder, recurrent episode ([place]) 08/08/2015  . Acute encephalopathy   . Acute delirium   . Anxiety state   . Essential hypertension   . Hypokalemia   . Septic shock ([place])   . CAP (community acquired pneumonia)   . Pulmonary hypertension ([place])   . Chronic pain syndrome   . Critical illness myopathy   . Pneumococcal lobar pneumonia ([place])   . ARDS (adult respiratory distress syndrome) ([place])   . Encounter for tube feeding instruction   . Pneumonia 07/17/2015  . Sepsis ([place]) 07/17/2015  . AKI (acute kidney injury) ([place]) 07/17/2015  . Headache   . Endotracheally intubated   . Lobar pneumonia ([place])   . Acute respiratory failure with hypoxia ([place])   . Lung mass 02/11/2015  . Right ankle pain 05/14/2014  . Left lumbar radiculopathy 05/14/2014  . Essential hypertension, benign 12/11/2013  . Chronic pain 12/11/2013  . Anxiety 12/10/2013  . Depression 12/10/2013  . Migraine 12/10/2013    Past Medical History:  Past Medical History  Diagnosis Date  . Hypertension    Past Surgical History:  Past Surgical History  Procedure Laterality Date  . Hand surgery  1994    left hand  . Removal of cyst between lungs  1998  . Appendectomy  2004   . Cholecystectomy  2004    Assessment & Plan Clinical Impression: Cynthia Murray is a 45 y.o. right handed female with history of hypertension, chronic pain, depression, anxiety disorder who was admitted via [facility] on 07/17/2015 with dyspnea, cough, chest pain and headache. She was noted to have lactic acidosis with hypotension and AKI. CT of the chest showed lobar pneumonia of the left upper lobe with developing bronchial pneumonia throughout the remaining portions of the lungs. Cranial CT scan negative. She was started on IV antibiotics, pressors and intubated due to ARDS. She has had issues with delirium and EEG done showing global cerebral dysfunction consistent with non-specific encephalopathy. Blood cultures and urine cultures negative. She tolerated extubation on 07/28/2015 but developed paranoia with confused language. Ammonia levels elevated at 83 with sodium 150. Neurology felt that changes were due to metabolic derangement and should improve with correction of abnormality. MRI brain negative for acute changes and psychiatry consulted for input on management of anxiety, depression and panic disorder. Dr. Darleene Cleaver has been following for supportive therapy and Buspar was increased to 10 mg bid with recommendations to decrease Klonopin use. She continues to have cognitive deficits with delayed processing, poor safety awareness and requires multimodal cues for transfers. Therapy ongoing and CIR recommended due to debilitated state. Patient transferred to CIR on 08/09/2015.   Patient currently requires min A overall and up to max with mobility secondary to muscle weakness and muscle  joint tightness, decreased cardiorespiratoy endurance, impaired timing and sequencing, decreased awareness, decreased problem solving, decreased safety awareness, decreased memory and delayed processing and decreased standing balance, decreased postural control and decreased balance strategies.  Prior to  hospitalization, patient was independent  with mobility and lived with Family (Plans to live with sister and father in mobile home in [place] or other sister in [place] ) in a Mobile home home.  Home access is 2 (at both places)Stairs to enter.  Patient will benefit from skilled PT intervention to maximize safe functional mobility, minimize fall risk and decrease caregiver burden for planned discharge home with 24 hour supervision.  Anticipate patient will benefit from follow up [facility] at discharge.  PT - End of Session Activity Tolerance: Tolerates 30+ min activity with multiple rests;Decreased this session Endurance Deficit: Yes Endurance Deficit Description: shortness of breath with minimal activity PT Assessment Rehab Potential (ACUTE/IP ONLY): Good Barriers to Discharge: Decreased caregiver support PT Patient demonstrates impairments in the following area(s): Balance;Behavior;Endurance;Motor;Nutrition;Pain;Safety PT Transfers Functional Problem(s): Bed Mobility;Bed to Chair;Car;Furniture PT Locomotion Functional Problem(s): Ambulation;Wheelchair Mobility;Stairs PT Plan PT Intensity: Minimum of 1-2 x/day ,45 to 90 minutes PT Frequency: 5 out of 7 days PT Duration Estimated Length of Stay: 7-10 days PT Treatment/Interventions: Ambulation/gait training;Balance/vestibular training;Cognitive remediation/compensation;Community reintegration;Discharge planning;DME/adaptive equipment instruction;Functional mobility training;Neuromuscular re-education;Pain management;Patient/family education;Psychosocial support;Therapeutic Exercise;Therapeutic Activities;Stair training;UE/LE Strength taining/ROM;UE/LE Coordination activities;Wheelchair propulsion/positioning PT Transfers Anticipated Outcome(s): mod I PT Locomotion Anticipated Outcome(s): supervision PT Recommendation Recommendations for Other Services: Neuropsych consult Follow Up Recommendations: Home health PT;24 hour  supervision/assistance Patient destination: Home Equipment Recommended: To be determined  Skilled Therapeutic Intervention Skilled therapeutic intervention initiated after completion of evaluation. Discussed with patient falls risk, safety within room, and focus of therapy during stay. Discussed possible length of stay, goals, and follow-up therapy. Patient fluctuated throughout session between min A to max A for sit > stand transfers but was overall min A using RW up to 15 ft and up/down 2 stairs using 2 rails. Patient hyperverbal and benefited from encouragement throughout session. Patient recalled nothing prior to sister's wedding in November as well as nothing since the wedding. Patient with progressively increased anxiety throughout evaluation, stating, "I'm trying not to have a panic attack," by the end of the session and requested klonopin, RN notified. Patient requested to return to bed at end of session, left semi reclined with all needs within reach.   PT Evaluation Precautions/Restrictions Precautions Precautions: Fall Restrictions Weight Bearing Restrictions: No General Chart Reviewed: Yes Family/Caregiver Present: No  Pain Pain Assessment Pain Assessment: No/denies pain Home Living/Prior Functioning Home Living Available Help at Discharge: Family;Available PRN/intermittently Type of Home: Mobile home Home Access: Stairs to enter Entrance Stairs-Number of Steps: 2 (at both places) Entrance Stairs-Rails: Right;Left;Can reach both Home Layout: One level Bathroom Shower/Tub: Multimedia programmer: Standard Bathroom Accessibility: Yes  Lives With: Family (Plans to live with sister and father in mobile home in [place] or other sister in [place] ) Prior Function Level of Independence: Independent with basic ADLs;Independent with gait;Independent with transfers  Able to Take Stairs?: Yes Driving: Yes Vocation: Unemployed Vocation Requirements: work  injury Leisure: Hobbies-no Comments: unable to recall anything before or after sister's wedding in November Vision/Perception   No change from baseline  Cognition Focused Attention: Appears intact Sustained Attention: Impaired Sustained Attention Impairment: Verbal complex;Functional basic Memory: Appears intact Awareness: Appears intact Problem Solving: Appears intact Executive Function: Organizing Organizing: Impaired Organizing Impairment: Functional complex;Verbal complex Sensation Sensation Light Touch: Appears Intact Stereognosis: Not tested  Hot/Cold: Appears Intact Proprioception: Appears Intact Additional Comments: reports hypersensitivity in both feet and hands Coordination Gross Motor Movements are Fluid and Coordinated: No Fine Motor Movements are Fluid and Coordinated: No Motor  Motor Motor: Within Functional Limits Motor - Skilled Clinical Observations: generalized weakness, tremors  Mobility Bed Mobility Bed Mobility: Sit to Supine Sit to Supine: 5: Supervision Transfers Transfers: Yes Sit to Stand: 2: Max assist;4: Min assist;3: Mod assist;With upper extremity assist Sit to Stand Details: Verbal cues for sequencing;Verbal cues for technique;Verbal cues for precautions/safety;Verbal cues for safe use of DME/AE Stand to Sit: 4: Min guard;With upper extremity assist Locomotion  Ambulation Ambulation: Yes Ambulation/Gait Assistance: 4: Min assist Ambulation Distance (Feet): 15 Feet Assistive device: Rolling walker;1 person hand held assist Gait Gait: Yes Gait Pattern: Impaired Gait Pattern: Step-through pattern;Decreased stride length;Lateral trunk lean to right;Lateral trunk lean to left;Decreased trunk rotation;Trunk flexed Gait velocity: decreased Stairs / Additional Locomotion Stairs: Yes Stairs Assistance: 3: Mod assist Stairs Assistance Details: Verbal cues for sequencing;Verbal cues for technique;Verbal cues for precautions/safety Stair  Management Technique: Two rails;Step to pattern;Forwards Number of Stairs: 2 Height of Stairs: 6 Ramp: Not tested (comment) Curb: Not tested (comment) Architect: Yes Wheelchair Assistance: 5: Careers information officer: Both lower extermities Wheelchair Parts Management: Needs assistance Distance: 50 ft  Trunk/Postural Assessment  Cervical Assessment Cervical Assessment: Within Functional Limits Thoracic Assessment Thoracic Assessment: Within Functional Limits Lumbar Assessment Lumbar Assessment: Within Functional Limits Postural Control Postural Control: Within Functional Limits  Balance Balance Balance Assessed: Yes Static Standing Balance Static Standing - Balance Support: During functional activity;No upper extremity supported Static Standing - Level of Assistance: 5: Stand by assistance Dynamic Standing Balance Dynamic Standing - Balance Support: During functional activity;Bilateral upper extremity supported Dynamic Standing - Level of Assistance: 4: Min assist Extremity Assessment  RLE Assessment RLE Assessment: Within Functional Limits RLE Strength RLE Overall Strength: Deficits RLE Overall Strength Comments: grossly 2+/5 hip flexion, 5/5 knee flexion/extension, 3+/5 ankle DF LLE Assessment LLE Assessment: Exceptions to [facility] LLE Strength LLE Overall Strength: Deficits LLE Overall Strength Comments: grossly 3-/5 hip flexion, 5/5 knee flexion/extension, 3+/5 ankle DF   See Function Navigator for Current Functional Status.   Refer to Care Plan for Long Term Goals  Recommendations for other services: Neuropsych  Discharge Criteria: Patient will be discharged from PT if patient refuses treatment 3 consecutive times without medical reason, if treatment goals not met, if there is a change in medical status, if patient makes no progress towards goals or if patient is discharged from hospital.  The above assessment, treatment plan,  treatment alternatives and goals were discussed and mutually agreed upon: by patient  Laretta Alstrom 08/10/2015, 12:37 PM

## 2015-08-11 ENCOUNTER — Inpatient Hospital Stay (HOSPITAL_COMMUNITY): Payer: MEDICAID | Admitting: Speech Pathology

## 2015-08-11 ENCOUNTER — Inpatient Hospital Stay (HOSPITAL_COMMUNITY): Payer: Self-pay | Admitting: Physical Therapy

## 2015-08-11 ENCOUNTER — Inpatient Hospital Stay (HOSPITAL_COMMUNITY): Payer: MEDICAID

## 2015-08-11 ENCOUNTER — Inpatient Hospital Stay (HOSPITAL_COMMUNITY): Payer: Self-pay

## 2015-08-11 NOTE — Progress Notes (Signed)
Speech Language Pathology Daily Session Note  Patient Details  Name: Cynthia Murray MRN: [id number] Date of Birth: 15-Jul-1971  Today's Date: 08/11/2015 SLP Individual Time: 5056-9794 SLP Individual Time Calculation (min): 60 min  Short Term Goals: Week 1: SLP Short Term Goal 1 (Week 1): Pt to complete moderate level functional math tasks at mod I level due to increased time.  SLP Short Term Goal 2 (Week 1): Pt to complete functional reasoning activities such as deductive reasoning/scheduling at mod I level with compensatory strategies. SLP Short Term Goal 3 (Week 1): Pt to demonstrate adequate alternating and divided attention for successful completion of functional activity with min A.  SLP Short Term Goal 4 (Week 1): Pt to demonstrate reading comprehension at 100% acc at the paragraph to functional level at mod I level.  Skilled Therapeutic Interventions: Pt participated in cognitive-linguistic therapy in the speech therapy room. Pt was able to recall session from previous day including the established goals. Pt was able to provide slightly increased information today re: her interests and activities prior to this hospitalization. Pt indicated that she really enjoys reading and has a close relationship with her two sisters who live locally. Functional math word problems were addressed using the math subtest of the ALFA  (Assessment of Language-related Functional Activities). Pt was able to complete word problems with 8/10 acc and increased processing time. Working memory task with 2 units completed with 100% acc. However, with three units, the patient  was unable to complete the activity. Discussed mental fatigue and the need to take rest breaks to aid in recovery. Pt verbalized understanding. Pt required mod A for alternating attention task involving paired words. Sustained attention adequate for 10 minute task- however the pt does frequently self-distract with humor, but changed behavior with  verbal cueing to redirect.   Function:  Eating Eating     Eating Assist Level: No help, No cues           Cognition Comprehension Comprehension assist level: Follows basic conversation/direction with no assist  Expression   Expression assist level: Expresses complex ideas: With extra time/assistive device  Social Interaction Social Interaction assist level: Interacts appropriately 90% of the time - Needs monitoring or encouragement for participation or interaction.  Problem Solving Problem solving assist level: Solves basic problems with no assist  Memory Memory assist level: Recognizes or recalls 90% of the time/requires cueing < 10% of the time    Pain Pain Assessment Pain Assessment: No/denies pain  Therapy/Group: Individual Therapy  Vinetta Bergamo 08/11/2015, 4:00 PM

## 2015-08-11 NOTE — Progress Notes (Signed)
Occupational Therapy Session Note  Patient Details  Name: Cynthia Murray MRN: [id number] Date of Birth: [birth year]  Today's Date: 08/11/2015 OT Individual Time: 0900-1000 OT Individual Time Calculation (min): 60 min    Short Term Goals: Week 1:  OT Short Term Goal 1 (Week 1): STG=LTG d/t short anticipated LOS  Skilled Therapeutic Interventions/Progress Updates: ADL-retraining at shower level with focus on improved dynamic standing balance, safety awareness, and general endurance.   Pt received asleep in bed but aroused with min multimodal cues.   Pt reports complaint about breakfast order error and was educated on process to request desired meal.   With extra time to reorient to task, pt completes bed mobility and ambulation to bathroom with min guard assist d/t gait disturbance/balance deficits.   Pt toilets and proceeds to shower with supervision.   Pt completes bathing and dresses in bathroom while seated on tub bench with min assist to don new sports bra.    Pt recovers to w/c d/t fatigue and is setup for self-feeding at end of session with all needs within reach.        Therapy Documentation Precautions:  Precautions Precautions: Fall Restrictions Weight Bearing Restrictions: No   Vital Signs: Therapy Vitals Pulse Rate: (!) 114 BP: (!) 112/58 mmHg   Pain: Pain Assessment Pain Assessment: No/denies pain  ADL: ADL ADL Comments: see Functional Assessment Tool   See Function Navigator for Current Functional Status.   Therapy/Group: Individual Therapy  Yaw Escoto 08/11/2015, 10:14 AM

## 2015-08-11 NOTE — Progress Notes (Signed)
Physical Therapy Session Note  Patient Details  Name: Cynthia Murray MRN: [id number] Date of Birth: 25-Mar-1971  Today's Date: 08/11/2015 PT Individual Time: 1300-1358 PT Individual Time Calculation (min): 58 min   Short Term Goals: Week 1:  PT Short Term Goal 1 (Week 1): = LTGs due to anticipated LOS  Skilled Therapeutic Interventions/Progress Updates:    Patient in bathroom upon my entry.  Assisted with sit to stand and balance while pt adjusted clothing.  Ambulated to sink RW minguard A.  Standing to wash hands minguard to S without UE support.  Ambulated 62' to dayroom with w/c following.  Seated rest with HR 101, SpO2 100 on RA.  Assisted in w/c rest of way to therapy gym.  Transferred to mat minguard A with RW, assist for w/c set up with foot plates.  Patient performed therex as follows: sit to stand x 5 with UE support on knees and minguard A; hooklying marching with green t-band x 10; clamshells w/green t-band x 15; lateral trunk rolls x 10; bridging x 10 w/3 sec hold; stretching with A hamstrings, piriformis and glutes 1-2 reps for 20 second hold each.  Sit to supine supervision, supine to side to sit minguard A with cues for technique.  Patient attempted forward step ups in parallel bars and performed x 1 to 6" step on each foot with mod A, then reported legs to fatigued to continue.  Standing balance activity reaching for and tossing horseshoes with minguard support no UE support.  Ambulated to w/c 5' no device min A.  Propelled 49' with feet in w/c then assisted rest of way to her room.  Left in w/c all needs in reach,  Therapy Documentation Precautions:  Precautions Precautions: Fall Restrictions Weight Bearing Restrictions: No  Pain: Pain Assessment Pain Assessment: No/denies pain Pain Score: 4  Pain Type: Chronic pain Pain Location: Back Pain Descriptors / Indicators: Aching Pain Onset: With Activity Pain Intervention(s): Ambulation/increased activity;Repositioned        See Function Navigator for Current Functional Status.   Therapy/Group: Individual Therapy  Reginia Naas  [place], [place] 08/11/2015  08/11/2015, 4:32 PM

## 2015-08-11 NOTE — Progress Notes (Signed)
Physical Therapy Session Note  Patient Details  Name: Cynthia Murray MRN: [id number] Date of Birth: 07/04/1971  Today's Date: 08/11/2015 PT Individual Time: 1433-1500 PT Individual Time Calculation (min): 27 min   Short Term Goals: Week 1:  PT Short Term Goal 1 (Week 1): = LTGs due to anticipated LOS  Skilled Therapeutic Interventions/Progress Updates:   Pt received seated in w/c; reporting significant fatigue after previous PT session but willing to participate.  Pt participated in BERG balance assessment with frequent sitting rest breaks due to mm tremors and SOB.  At end of session pt transferred w/c > bed stand pivot with RW supervision-min A and sit > supine with supervision.  Discussed falls risk with pt; pt verbalized agreement and stated she could see the benefit of having a RW for balance and endurance when fatigued.  Discussed with pt ordering RW for home; pt agreeable.  Pt left in bed with all items within reach.   Therapy Documentation Precautions:  Precautions Precautions: Fall Restrictions Weight Bearing Restrictions: No Vital Signs: Therapy Vitals Temp: 98.2 F (36.8 C) Temp Source: Oral Pulse Rate: 93 Resp: 18 BP: (!) 105/54 mmHg Patient Position (if appropriate): Lying Oxygen Therapy SpO2: 99 % O2 Device: Not Delivered Pain: Pain Assessment Pain Assessment: No/denies pain Balance: Standardized Balance Assessment Standardized Balance Assessment: Berg Balance Test Berg Balance Test Sit to Stand: Able to stand  independently using hands Standing Unsupported: Able to stand safely 2 minutes Sitting with Back Unsupported but Feet Supported on Floor or Stool: Able to sit safely and securely 2 minutes Stand to Sit: Sits safely with minimal use of hands Transfers: Able to transfer safely, minor use of hands Standing Unsupported with Eyes Closed: Able to stand 10 seconds with supervision Standing Ubsupported with Feet Together: Able to place feet together  independently and stand for 1 minute with supervision From Standing, Reach Forward with Outstretched Arm: Can reach forward >12 cm safely (5") From Standing Position, Pick up Object from Floor: Able to pick up shoe, needs supervision From Standing Position, Turn to Look Behind Over each Shoulder: Looks behind from both sides and weight shifts well Turn 360 Degrees: Needs assistance while turning Standing Unsupported, Alternately Place Feet on Step/Stool: Able to complete >2 steps/needs minimal assist Standing Unsupported, One Foot in Front: Able to take small step independently and hold 30 seconds Standing on One Leg: Unable to try or needs assist to prevent fall Total Score: 38 Patient demonstrates increased fall risk as noted by score of  38/56 on Berg Balance Scale.  (<36= high risk for falls, close to 100%; 37-45 significant >80%; 46-51 moderate >50%; 52-55 lower >25%)   See Function Navigator for Current Functional Status.   Therapy/Group: Individual Therapy  Raylene Everts [facility] 08/11/2015, 4:21 PM

## 2015-08-11 NOTE — Progress Notes (Signed)
[facility] PHYSICAL MEDICINE & REHABILITATION     PROGRESS NOTE  Subjective/Complaints:  Pt laying in bed this AM.  She notes she slept better, her rash is better, and therapies went well yesterday. She is ready to resume again today.   ROS:  Denies CP, SOB, n/v/d.   Objective: Vital Signs: Blood pressure 127/76, pulse 73, temperature 98.4 F (36.9 C), temperature source Oral, resp. rate 18, weight 109.725 kg (241 lb 14.4 oz), last menstrual period 07/31/2015, SpO2 95 %. No results found.  Recent Labs  08/09/15 0755 08/10/15 0700  WBC 13.5* 9.3  HGB 13.4 11.7*  HCT 40.7 35.4*  PLT 258 184    Recent Labs  08/09/15 0755 08/10/15 0700  NA 142 140  K 3.5 3.1*  CL 107 105  GLUCOSE 109* 115*  BUN 5* 6  CREATININE 0.74 0.63  CALCIUM 9.9 9.4   CBG (last 3)  No results for input(s): GLUCAP in the last 72 hours.  Wt Readings from Last 3 Encounters:  08/09/15 109.725 kg (241 lb 14.4 oz)  08/07/15 110.224 kg (243 lb)  06/22/15 119.296 kg (263 lb)    Physical Exam:  BP 127/76 mmHg  Pulse 73  Temp(Src) 98.4 F (36.9 C) (Oral)  Resp 18  Wt 109.725 kg (241 lb 14.4 oz)  SpO2 95%  LMP 07/31/2015 Constitutional: She appears well-developed and well-nourished. NAD. Vital signs reviewed. HENT: Normocephalic and atraumatic.  Eyes: Conjunctivae and EOM are normal. No scleral icterus.  Cardiovascular: Regular rhythm and rate.  Respiratory: Effort normal and breath sounds normal. No respiratory distress. She has no wheezes.  GI: Soft. Bowel sounds are normal. She exhibits no distension. There is no tenderness.  Musculoskeletal: No edema.  No tenderness. Neurological: She is alert and oriented. Ataxic speech   Temors noted   She is able to follow simple motor commands.  Motor: B/l UE 4+/5 proximal to distal B/l LE 4+/5 proximal to distal Skin: Skin is warm and dry.  Left forearm with multiple erythematous round dime size macular areas (improving) Psychiatric: Her affect  is inappropriate. Her speech is tangential. Cognition and memory are impaired. She expresses restlessness.   Assessment/Plan: 1. Functional deficits secondary to encephalopathy/debility which require 3+ hours per day of interdisciplinary therapy in a comprehensive inpatient rehab setting. Physiatrist is providing close team supervision and 24 hour management of active medical problems listed below. Physiatrist and rehab team continue to assess barriers to discharge/monitor patient progress toward functional and medical goals.  Function:  Bathing Bathing position      Bathing parts      Bathing assist        Upper Body Dressing/Undressing Upper body dressing                    Upper body assist        Lower Body Dressing/Undressing Lower body dressing                                  Lower body assist        Toileting Toileting   Toileting steps completed by patient: Adjust clothing prior to toileting, Performs perineal hygiene, Adjust clothing after toileting   Toileting Assistive Devices: Grab bar or rail  Toileting assist Assist level: Touching or steadying assistance (Pt.75%)   Transfers Chair/bed transfer   Chair/bed transfer method: Ambulatory Chair/bed transfer assist level: Maximal assist (Pt 25 - 49%/lift and lower)  Locomotion Ambulation     Max distance: 15 Assist level: Touching or steadying assistance (Pt > 75%)   Wheelchair   Type: Manual Max wheelchair distance: 50 Assist Level: Supervision or verbal cues  Cognition Comprehension Comprehension assist level: Follows basic conversation/direction with no assist  Expression Expression assist level: Expresses complex ideas: With no assist  Social Interaction Social Interaction assist level: Interacts appropriately 90% of the time - Needs monitoring or encouragement for participation or interaction.  Problem Solving Problem solving assist level: Solves basic problems with no  assist  Memory Memory assist level: Recognizes or recalls 75 - 89% of the time/requires cueing 10 - 24% of the time    Medical Problem List and Plan: 1. Cognitive deficits, delayed processing, and weakness secondary to encephalopathy/debility.  Cont CIR 2. DVT Prophylaxis/Anticoagulation: Pharmaceutical: Lovenox 3. Chronic back pain/Pain Management: Used tramadol prn with muscle relaxers.  4. Mood: LCSW to follow for evaluation and support.  5. Neuropsych: This patient is not fully capable of making decisions on her own behalf. 6. Skin/Wound Care:   Marker allergy - monitor lesions on left forearm (improving).   Steroid cream ordered 1/24 7. Fluids/Electrolytes/Nutrition: Monitor I/O. Po intake improving.   Hypokalemia: Repleted 1/24.     Labs ordered for tomorrow  8. Leucocytosis:   WNL on 1/24.   Will cont to monitor periodically  Will order labs for tomorrow 9. Anxiety d/o with depression:   Continue Prozac 20 mg with buspar bid.  10. Migranes: on fiorecet prn.: Controlled at present 11. Encephalopathy: Ammonia WNL on 1/24. 12. Tachycardia/tremors:   Likely due to deconditioning.   Cont low dose BB.   LOS (Days) 2 A FACE TO FACE EVALUATION WAS PERFORMED  Ankit Lorie Phenix 08/11/2015 8:15 AM

## 2015-08-12 ENCOUNTER — Inpatient Hospital Stay (HOSPITAL_COMMUNITY): Payer: MEDICAID

## 2015-08-12 ENCOUNTER — Inpatient Hospital Stay (HOSPITAL_COMMUNITY): Payer: Self-pay | Admitting: *Deleted

## 2015-08-12 ENCOUNTER — Inpatient Hospital Stay (HOSPITAL_COMMUNITY): Payer: MEDICAID | Admitting: Speech Pathology

## 2015-08-12 LAB — CBC WITH DIFFERENTIAL/PLATELET
Basophils Absolute: 0 10*3/uL (ref 0.0–0.1)
Basophils Relative: 0 %
Eosinophils Absolute: 0.8 10*3/uL — ABNORMAL HIGH (ref 0.0–0.7)
Eosinophils Relative: 9 %
HEMATOCRIT: 34 % — AB (ref 36.0–46.0)
HEMOGLOBIN: 11.2 g/dL — AB (ref 12.0–15.0)
LYMPHS ABS: 3.6 10*3/uL (ref 0.7–4.0)
LYMPHS PCT: 44 %
MCH: 30.1 pg (ref 26.0–34.0)
MCHC: 32.9 g/dL (ref 30.0–36.0)
MCV: 91.4 fL (ref 78.0–100.0)
MONOS PCT: 7 %
Monocytes Absolute: 0.6 10*3/uL (ref 0.1–1.0)
NEUTROS ABS: 3.4 10*3/uL (ref 1.7–7.7)
NEUTROS PCT: 40 %
Platelets: 167 10*3/uL (ref 150–400)
RBC: 3.72 MIL/uL — ABNORMAL LOW (ref 3.87–5.11)
RDW: 14.4 % (ref 11.5–15.5)
WBC: 8.3 10*3/uL (ref 4.0–10.5)

## 2015-08-12 LAB — BASIC METABOLIC PANEL
Anion gap: 7 (ref 5–15)
BUN: 8 mg/dL (ref 6–20)
CHLORIDE: 109 mmol/L (ref 101–111)
CO2: 25 mmol/L (ref 22–32)
CREATININE: 0.48 mg/dL (ref 0.44–1.00)
Calcium: 9.2 mg/dL (ref 8.9–10.3)
GFR calc non Af Amer: >60 mL/min (ref >60)
Glucose, Bld: 102 mg/dL — ABNORMAL HIGH (ref 65–99)
Potassium: 3.6 mmol/L (ref 3.5–5.1)
Sodium: 141 mmol/L (ref 135–145)

## 2015-08-12 MED ORDER — FLUOXETINE HCL 20 MG PO CAPS
20.0000 mg | ORAL_CAPSULE | Freq: Every day | ORAL | Status: DC
  Administered 2015-08-13 – 2015-08-17 (×5): 20 mg via ORAL
  Filled 2015-08-12 (×5): qty 1

## 2015-08-12 NOTE — Progress Notes (Signed)
[facility] PHYSICAL MEDICINE & REHABILITATION     PROGRESS NOTE  Subjective/Complaints:  Pt resting comfortably in bed this morning.  She is sleeping better after working hard in therapies. She will also notes improvement in strength and rash.  ROS:  Denies pruritis, CP, SOB, n/v/d.   Objective: Vital Signs: Blood pressure 116/74, pulse 99, temperature 98.8 F (37.1 C), temperature source Oral, resp. rate 18, weight 111.041 kg (244 lb 12.8 oz), last menstrual period 07/31/2015, SpO2 96 %. No results found.  Recent Labs  08/09/15 0755 08/10/15 0700  WBC 13.5* 9.3  HGB 13.4 11.7*  HCT 40.7 35.4*  PLT 258 184    Recent Labs  08/09/15 0755 08/10/15 0700  NA 142 140  K 3.5 3.1*  CL 107 105  GLUCOSE 109* 115*  BUN 5* 6  CREATININE 0.74 0.63  CALCIUM 9.9 9.4   CBG (last 3)  No results for input(s): GLUCAP in the last 72 hours.  Wt Readings from Last 3 Encounters:  08/11/15 111.041 kg (244 lb 12.8 oz)  08/07/15 110.224 kg (243 lb)  06/22/15 119.296 kg (263 lb)    Physical Exam:  BP 116/74 mmHg  Pulse 99  Temp(Src) 98.8 F (37.1 C) (Oral)  Resp 18  Wt 111.041 kg (244 lb 12.8 oz)  SpO2 96%  LMP 07/31/2015 Constitutional: She appears well-developed and well-nourished. NAD. Vital signs reviewed. HENT: Normocephalic and atraumatic.  Eyes: Conjunctivae and EOM are normal. No scleral icterus.  Cardiovascular: Regular rhythm and rate.  Respiratory: Effort normal and breath sounds normal. No respiratory distress. She has no wheezes.  GI: Soft. Bowel sounds are normal. She exhibits no distension. There is no tenderness.  Musculoskeletal: No edema.  No tenderness. Neurological: She is alert and oriented. Ataxic speech   Temors noted (appears to be improving) She is able to follow simple motor commands.  Motor: B/l UE 4+/5 proximal to distal B/l LE 4+/5 proximal to distal Skin: Skin is warm and dry.  Left forearm with multiple erythematous round dime size macular  areas (slowly improving) Psychiatric: Her affect is inappropriate. Cognition and memory are impaired. She expresses restlessness.   Assessment/Plan: 1. Functional deficits secondary to encephalopathy/debility which require 3+ hours per day of interdisciplinary therapy in a comprehensive inpatient rehab setting. Physiatrist is providing close team supervision and 24 hour management of active medical problems listed below. Physiatrist and rehab team continue to assess barriers to discharge/monitor patient progress toward functional and medical goals.  Function:  Bathing Bathing position   Position: Shower  Bathing parts Body parts bathed by patient: Right arm, Left arm, Chest, Abdomen, Front perineal area, Buttocks, Right upper leg, Left upper leg, Right lower leg, Left lower leg    Bathing assist Assist Level: Supervision or verbal cues      Upper Body Dressing/Undressing Upper body dressing   What is the patient wearing?: Bra, Pull over shirt/dress Bra - Perfomed by patient: Thread/unthread right bra strap, Thread/unthread left bra strap Bra - Perfomed by helper: Hook/unhook bra (pull down sports bra) Pull over shirt/dress - Perfomed by patient: Thread/unthread right sleeve, Thread/unthread left sleeve, Put head through opening, Pull shirt over trunk          Upper body assist Assist Level: Set up   Set up : To obtain clothing/put away  Lower Body Dressing/Undressing Lower body dressing   What is the patient wearing?: Underwear, Pants, Shoes Underwear - Performed by patient: Thread/unthread right underwear leg, Thread/unthread left underwear leg, Pull underwear up/down  Pants- Performed by patient: Thread/unthread right pants leg, Thread/unthread left pants leg, Pull pants up/down, Fasten/unfasten pants           Shoes - Performed by patient: Don/doff right shoe, Don/doff left shoe            Lower body assist Assist for lower body dressing: Supervision or verbal  cues      Toileting Toileting   Toileting steps completed by patient: Adjust clothing prior to toileting, Performs perineal hygiene, Adjust clothing after toileting   Toileting Assistive Devices: Grab bar or rail  Toileting assist Assist level: Touching or steadying assistance (Pt.75%)   Transfers Chair/bed transfer   Chair/bed transfer method: Ambulatory Chair/bed transfer assist level: Touching or steadying assistance (Pt > 75%)       Locomotion Ambulation     Max distance: 85 Assist level: Touching or steadying assistance (Pt > 75%)   Wheelchair   Type: Manual Max wheelchair distance: 80 Assist Level: Supervision or verbal cues  Cognition Comprehension Comprehension assist level: Follows basic conversation/direction with no assist  Expression Expression assist level: Expresses complex ideas: With extra time/assistive device  Social Interaction Social Interaction assist level: Interacts appropriately 90% of the time - Needs monitoring or encouragement for participation or interaction.  Problem Solving Problem solving assist level: Solves basic problems with no assist  Memory Memory assist level: Recognizes or recalls 90% of the time/requires cueing < 10% of the time    Medical Problem List and Plan: 1. Cognitive deficits, delayed processing, and weakness secondary to encephalopathy/debility.  Cont CIR 2. DVT Prophylaxis/Anticoagulation: Pharmaceutical: Lovenox 3. Chronic back pain/Pain Management: Used tramadol prn with muscle relaxers.  4. Mood: LCSW to follow for evaluation and support.  5. Neuropsych: This patient is not fully capable of making decisions on her own behalf. 6. Skin/Wound Care:   Marker allergy - monitor lesions on left forearm (slowly improving).   Steroid cream ordered 1/24 7. Fluids/Electrolytes/Nutrition: Monitor I/O. Po intake improving.   Hypokalemia: Repleted 1/24.     Labs pending  8. Leucocytosis:   WNL on 1/24.   Labs  pending  Will cont to monitor periodically 9. Anxiety d/o with depression:   Continue Prozac 20 mg with buspar bid.  10. Migranes: on fiorecet prn.: Controlled at present 11. Encephalopathy: Ammonia WNL on 1/24. 12. Tachycardia/tremors:   Likely due to deconditioning.   Cont low dose BB.   LOS (Days) 3 A FACE TO FACE EVALUATION WAS PERFORMED  Ankit Lorie Phenix 08/12/2015 7:30 AM

## 2015-08-12 NOTE — Progress Notes (Signed)
Speech Language Pathology Daily Session Note  Patient Details  Name: Cynthia Murray MRN: [id number] Date of Birth: [birth year]  Today's Date: 08/12/2015 SLP Individual Time: 1000-1100 SLP Individual Time Calculation (min): 60 min  Short Term Goals: Week 1: SLP Short Term Goal 1 (Week 1): Pt to complete moderate level functional math tasks at mod I level due to increased time.  SLP Short Term Goal 2 (Week 1): Pt to complete functional reasoning activities such as deductive reasoning/scheduling at mod I level with compensatory strategies. SLP Short Term Goal 3 (Week 1): Pt to demonstrate adequate alternating and divided attention for successful completion of functional activity with min A.  SLP Short Term Goal 4 (Week 1): Pt to demonstrate reading comprehension at 100% acc at the paragraph to functional level at mod I level.  Skilled Therapeutic Interventions: Pt participated in therapy to address cognitive goals. Again pt demo'd acurate recall of specific information from previous therapy session. Focus this date on deductive reasoning with use of alternating attention. Pt initially required mod A to complete the task, but improved to min A with time. Mental flexibility task completed in a distracting evironment with occasional  verbal cues only.  Gains noted and pt encouraged by progress.   Function:    Cognition Comprehension Comprehension assist level: Understands complex 90% of the time/cues 10% of the time  Expression   Expression assist level: Expresses complex ideas: With extra time/assistive device  Social Interaction Social Interaction assist level: Interacts appropriately with others with medication or extra time (anti-anxiety, antidepressant).  Problem Solving Problem solving assist level: Solves basic problems with no assist  Memory Memory assist level: Recognizes or recalls 90% of the time/requires cueing < 10% of the time    Pain Pain Assessment Pain Assessment:  No/denies pain  Therapy/Group: Individual Therapy  Vinetta Bergamo 08/12/2015, 11:20 AM

## 2015-08-12 NOTE — Progress Notes (Signed)
Occupational Therapy Session Note  Patient Details  Name: Cynthia Murray MRN: [id number] Date of Birth: Mar 07, 1971  Today's Date: 08/12/2015 OT Individual Time: 0900-1000 60 Minutes  Short Term Goals: Week 1:  OT Short Term Goal 1 (Week 1): STG=LTG d/t short anticipated LOS  Skilled Therapeutic Interventions/Progress Updates: ADL-retraining with focus on sustained attention, safety awareness, activity tolerance, pain managment and dynamic standing balance.  Pt completes bed mobility and ambulation to shower to bathe at shower-level with extra time, mod vc to progress through task, and min guard assist during mobility with supervision for safety during transfers.    Pt reports back pain as limiting her mobility during session; OT educated pt on use of heat packs/pads/K-pad and orthotics to support lumbar spine.   PA advised of need for mild compression (using abdominal binder).   Pt completes bathing/dressing with overall supervision assist although she lost her balance 1X while standing to pull up her pants.   Pt remained in w/c at end of session with all needs placed within reach.     Therapy Documentation Precautions:  Precautions Precautions: Fall Restrictions Weight Bearing Restrictions: No  Pain: Pain Assessment Pain Score: 4  Pain Type: Chronic pain Pain Location: Back Pain Descriptors / Indicators: Aching Pain Intervention(s): MD notified (Comment);Shower;Distraction;Heat applied  ADL: ADL ADL Comments: see Functional Assessment Tool  See Function Navigator for Current Functional Status.   Therapy/Group: Individual Therapy   Second session: Time: 1300-1330 Time Calculation (min):  30 min  Pain Assessment: No/denies pain  Skilled Therapeutic Interventions: ADL-retraining with focus on fall prevention/AE training using reacher, discharge planning, seated grooming.   Pt received seated in w/c and requesting to toilet.   Pt ambulates to bathroom with RW and standby  assist, completes transfer and toilets unassisted.   Pt reports awareness of balance deficit and anticipates possible fall at her sister Cynthia Murray's home (new plan for discharge from pt) d/t small dog.   OT instructs pt on use of reacher to remove obstacles and pick up items from floor, from cabinets, and as extension of reach.   Pt retrieves towels from floor and places them in laundry bag held by therapist.    Pt then sits in w/c to rest and performs grooming at sink unassisted.   OT advised pt of current plan for d/c on 08/17/15; pt reaction is surprised as she had anticipated d/c on 2/3 for unknown reason.   Pt declares no plan to cook at her sisters or to perform light housekeeping for several months.  Homemaking goals discontinued.   See FIM for current functional status  Therapy/Group: Individual Therapy  Kierah Goatley 08/12/2015, 1:00 PM

## 2015-08-12 NOTE — Progress Notes (Signed)
Physical Therapy Session Note  Patient Details  Name: Cynthia Murray MRN: [id number] Date of Birth: Aug 06, 1970  Today's Date: 08/12/2015 PT Individual Time: 1415-1515 PT Individual Time Calculation (min): 60 min   Short Term Goals: Week 1:  PT Short Term Goal 1 (Week 1): = LTGs due to anticipated LOS  Skilled Therapeutic Interventions/Progress Updates:  Tx focused on functional mobility training, therex, gait with RW, and NMR via forced use, manual facilitation, and multi-modal cues. PT resting in WC, disappointed about her Berg score yesterday; emotional support provided. Pt reporting 6/10 menstrual cramp pain today, preferring to take WC to gym.   Pt propelled WC to gym with S and good stroke length for activity tolerance.   Stand-step transfer WC>mat with Min A; all bed mobility S   Trialed hot pack for pain during mat exercise, but pt reporting too hot for body temperature, so discontinued.   Patient performed therex as follows: lower trunk rotations x 20; pelvic tilts x20; bridging x 10 w/3 sec hold; piriformis stretch 1 rep for 60 second hold each.   Gait with RW 1x20' and 1x120' towards room with close S until pt had trip and needed Min A for recovery due to fatigue; cues for breathing.   Nustep x  78mn with bil LEs only level 3>>5 at 60 spm average, Borg scale 13. HR average 105bpm throughout tx with activity and rest, 02 95% on RA.   Pt agreeable to attempt stairs when she's less fatigued tomorrow.      Therapy Documentation Precautions:  Precautions Precautions: Fall Restrictions Weight Bearing Restrictions: No    Pain: 6/10 menstrual cramping pain, modified tx   See Function Navigator for Current Functional Status.   Therapy/Group: Individual Therapy   CKennieth Rad PT, DPT  08/12/2015, 2:34 PM

## 2015-08-13 ENCOUNTER — Inpatient Hospital Stay (HOSPITAL_COMMUNITY): Payer: MEDICAID

## 2015-08-13 ENCOUNTER — Inpatient Hospital Stay (HOSPITAL_COMMUNITY): Payer: MEDICAID | Admitting: Speech Pathology

## 2015-08-13 ENCOUNTER — Inpatient Hospital Stay (HOSPITAL_COMMUNITY): Payer: MEDICAID | Admitting: Physical Therapy

## 2015-08-13 NOTE — Progress Notes (Signed)
Physical Therapy Session Note  Patient Details  Name: Cynthia Murray MRN: [id number] Date of Birth: March 22, 1971  Today's Date: 08/13/2015 PT Individual Time: 0800-0900 PT Individual Time Calculation (min): 60 min   Short Term Goals: Week 1:  PT Short Term Goal 1 (Week 1): = LTGs due to anticipated LOS  Skilled Therapeutic Interventions/Progress Updates:   Patient asleep upon arrival, required mod encouragement to particpate. Patient sat edge of bed to consume breakfast meal, then ambulated within room using RW for toileting and hand hygiene with supervision. Patient required verbal cues for safety with sit > stand to keep feet within RW and for safety using RW with functional tasks instead of placing RW off to side. Gait training 2 x 165 ft using RW with supervision and increased time, 2 standing rest breaks due to fatigue. Stair training up/down 4 (6") stairs using 2 rails with self-elected step-to pattern, min guard. Patient propelled wheelchair in controlled environment using BLE only 2 x 75 ft for strengthening and endurance. Patient left sitting in wheelchair with all needs within reach.    Therapy Documentation Precautions:  Precautions Precautions: Fall Restrictions Weight Bearing Restrictions: No Pain: Pain Assessment Pain Assessment: 0-10 Pain Score: 4  Pain Type: Chronic pain Pain Location: Back Pain Orientation: Lower Pain Descriptors / Indicators: Aching Pain Onset: On-going Pain Intervention(s): Rest;Ambulation/increased activity  See Function Navigator for Current Functional Status.   Therapy/Group: Individual Therapy  Laretta Alstrom 08/13/2015, 9:03 AM

## 2015-08-13 NOTE — Progress Notes (Signed)
Inpatient [facility] Individual Statement of Services  Patient Name:  Cynthia Murray  Date:  08/13/2015  Welcome to the [facility].  Our goal is to provide you with an individualized program based on your diagnosis and situation, designed to meet your specific needs.  With this comprehensive rehabilitation program, you will be expected to participate in at least 3 hours of rehabilitation therapies Monday-Friday, with modified therapy programming on the weekends.  Your rehabilitation program will include the following services:  Physical Therapy (PT), Occupational Therapy (OT), Speech Therapy (ST), 24 hour per day rehabilitation nursing, Case Management (Social Worker), Rehabilitation Medicine, Nutrition Services and Pharmacy Services  Weekly team conferences will be held on Wednesdays to discuss your progress.  Your Social Worker will talk with you frequently to get your input and to update you on team discussions.  Team conferences with you and your family in attendance may also be held.  Expected length of stay:  7 to 10 days  Overall anticipated outcome: Modified Independent and supervision with showers, stairs, and car transfers  Depending on your progress and recovery, your program may change. Your Social Worker will coordinate services and will keep you informed of any changes. Your Social Worker's name and contact numbers are listed  below.  The following services may also be recommended but are not provided by the [facility] will be made to provide these services after discharge if needed.  Arrangements include referral to agencies that provide these services.  Your insurance has been verified to be: you are applying for Medicaid while you are here Your primary doctor is:  Dr. Marcial Pacas  Pertinent information will be shared with your doctor and your insurance company.  Social Worker:  Alfonse Alpers, LCSW  [phone] or ([phone]  Information discussed with and copy given to patient by: Trey Sailors, 08/13/2015, 10:25 AM

## 2015-08-13 NOTE — Progress Notes (Signed)
[facility] PHYSICAL MEDICINE & REHABILITATION     PROGRESS NOTE  Subjective/Complaints:  Pt doing well this morning stating that she continues to improve in her strength.    ROS:  Denies pruritis, CP, SOB, n/v/d.   Objective: Vital Signs: Blood pressure 106/57, pulse 95, temperature 98.2 F (36.8 C), temperature source Oral, resp. rate 18, weight 111.041 kg (244 lb 12.8 oz), last menstrual period 07/31/2015, SpO2 96 %. No results found.  Recent Labs  08/12/15 0653  WBC 8.3  HGB 11.2*  HCT 34.0*  PLT 167    Recent Labs  08/12/15 0653  NA 141  K 3.6  CL 109  GLUCOSE 102*  BUN 8  CREATININE 0.48  CALCIUM 9.2   CBG (last 3)  No results for input(s): GLUCAP in the last 72 hours.  Wt Readings from Last 3 Encounters:  08/11/15 111.041 kg (244 lb 12.8 oz)  08/07/15 110.224 kg (243 lb)  06/22/15 119.296 kg (263 lb)    Physical Exam:  BP 106/57 mmHg  Pulse 95  Temp(Src) 98.2 F (36.8 C) (Oral)  Resp 18  Wt 111.041 kg (244 lb 12.8 oz)  SpO2 96%  LMP 07/31/2015 Constitutional: She appears well-developed and well-nourished. NAD. Vital signs reviewed. HENT: Normocephalic and atraumatic.  Eyes: Conjunctivae and EOM are normal. No scleral icterus.  Cardiovascular: Regular rhythm and rate.  Respiratory: Effort normal and breath sounds normal. No respiratory distress. She has no wheezes.  GI: Soft. Bowel sounds are normal. She exhibits no distension. There is no tenderness.  Musculoskeletal: No edema.  No tenderness. Neurological: She is alert and oriented. Ataxic speech (improving) Temors noted (improving) She is able to follow simple motor commands.  Motor: B/l UE 4+/5 proximal to distal B/l LE 4+/5 proximal to distal Skin: Skin is warm and dry.  Left forearm with multiple erythematous round dime size macular areas (improving) Psychiatric: Her mood/affect is blunt. Cognition and memory are impaired.   Assessment/Plan: 1. Functional deficits secondary to  encephalopathy/debility which require 3+ hours per day of interdisciplinary therapy in a comprehensive inpatient rehab setting. Physiatrist is providing close team supervision and 24 hour management of active medical problems listed below. Physiatrist and rehab team continue to assess barriers to discharge/monitor patient progress toward functional and medical goals.  Function:  Bathing Bathing position   Position: Shower  Bathing parts Body parts bathed by patient: Right arm, Left arm, Chest, Abdomen, Front perineal area, Right upper leg, Buttocks, Left upper leg, Back, Left lower leg, Right lower leg    Bathing assist Assist Level: Supervision or verbal cues      Upper Body Dressing/Undressing Upper body dressing   What is the patient wearing?: Pull over shirt/dress Bra - Perfomed by patient: Thread/unthread right bra strap, Thread/unthread left bra strap Bra - Perfomed by helper: Hook/unhook bra (pull down sports bra) Pull over shirt/dress - Perfomed by patient: Thread/unthread right sleeve, Thread/unthread left sleeve, Put head through opening, Pull shirt over trunk          Upper body assist Assist Level: No help, No cues   Set up : To obtain clothing/put away  Lower Body Dressing/Undressing Lower body dressing   What is the patient wearing?: Underwear, Pants, Non-skid slipper socks Underwear - Performed by patient: Thread/unthread right underwear leg, Thread/unthread left underwear leg, Pull underwear up/down   Pants- Performed by patient: Thread/unthread right pants leg, Thread/unthread left pants leg, Pull pants up/down, Fasten/unfasten pants   Non-skid slipper socks- Performed by patient: Don/doff right sock,  Don/doff left sock       Shoes - Performed by patient: Don/doff right shoe, Don/doff left shoe            Lower body assist Assist for lower body dressing: Supervision or verbal cues      Toileting Toileting   Toileting steps completed by patient: Adjust  clothing prior to toileting, Performs perineal hygiene, Adjust clothing after toileting   Toileting Assistive Devices: Grab bar or rail  Toileting assist Assist level: Touching or steadying assistance (Pt.75%)   Transfers Chair/bed transfer   Chair/bed transfer method: Ambulatory Chair/bed transfer assist level: Touching or steadying assistance (Pt > 75%)       Locomotion Ambulation     Max distance: 120 Assist level: Touching or steadying assistance (Pt > 75%)   Wheelchair   Type: Manual Max wheelchair distance: 200 Assist Level: Supervision or verbal cues  Cognition Comprehension Comprehension assist level: Understands complex 90% of the time/cues 10% of the time  Expression Expression assist level: Expresses basic needs/ideas: With extra time/assistive device  Social Interaction Social Interaction assist level: Interacts appropriately with others - No medications needed.  Problem Solving Problem solving assist level: Solves complex problems: Recognizes & self-corrects  Memory Memory assist level: Complete Independence: No helper    Medical Problem List and Plan: 1. Cognitive deficits, delayed processing, and weakness secondary to encephalopathy/debility.  Cont CIR 2. DVT Prophylaxis/Anticoagulation: Pharmaceutical: Lovenox 3. Chronic back pain/Pain Management: Used tramadol prn with muscle relaxers.  4. Mood: LCSW to follow for evaluation and support.  5. Neuropsych: This patient is not fully capable of making decisions on her own behalf. 6. Skin/Wound Care:   Marker allergy - monitor lesions on left forearm (improving).   Steroid cream ordered 1/24 7. Fluids/Electrolytes/Nutrition: Monitor I/O. Po intake improving.   Hypokalemia: Repleted 1/24.  3.6 on 1/26 8. Leucocytosis: Resolved  WNL on 1/26.   Will cont to monitor periodically 9. Anxiety d/o with depression:   Continue Prozac 20 mg with buspar bid.   Will have Psych see pt to establish care, recs, and  for outpt follow up 10. Migranes: on fiorecet prn.: Controlled at present 11. Encephalopathy: Ammonia WNL on 1/24. 12. Tachycardia/tremors (improving):   Likely due to deconditioning.   Cont low dose BB.   LOS (Days) 4 A FACE TO FACE EVALUATION WAS PERFORMED  Janera Peugh Lorie Phenix 08/13/2015 7:57 AM
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
## 2015-08-13 NOTE — Progress Notes (Signed)
Occupational Therapy Session Note  Patient Details  Name: Maricela Schreur MRN: [id number] Date of Birth: 1970/09/28  Today's Date: 08/13/2015 OT Individual Time: 1030-1045 OT Individual Time Calculation (min): 15 min    Short Term Goals: Week 1:  OT Short Term Goal 1 (Week 1): STG=LTG d/t short anticipated LOS  Skilled Therapeutic Interventions/Progress Updates: Pt received supine in bed, covers over her head and reporting onset of severe headache with suspicion of onset of infrequent migraine.  Pt requested room darkened.   OT offered emotional support, cold wash cloth and tea to help manage symptoms however pt was unable to participate in further treatment d/t pain.   RN aware and planning to provide meds.     Therapy Documentation Precautions:  Precautions Precautions: Fall Restrictions Weight Bearing Restrictions: No   General: General OT Amount of Missed Time: 75 Minutes  Pain: Pain Assessment Pain Assessment: 0-10 Pain Score: 8 Pain Type: acute pain Pain Location: Head Pain Orientation: temporal Pain Descriptors / Indicators: Sharp Pain Frequency: Occasional Pain Onset: Progressive Patients Stated Pain Goal: 2 Pain Intervention(s): Medication (See eMAR);Repositioned;Cold applied;Rest Multiple Pain Sites: No  ADL: ADL ADL Comments: see Functional Assessment Tool   See Function Navigator for Current Functional Status.   Therapy/Group: Individual Therapy  [place] 08/13/2015, 12:28 PM

## 2015-08-13 NOTE — Progress Notes (Signed)
Speech Language Pathology Daily Session Note  Patient Details  Name: Aayana Reinertsen MRN: [id number] Date of Birth: Jan 18, 1971  Today's Date: 08/13/2015 SLP Individual Time: 4174-0814 SLP Individual Time Calculation (min): 45 min  Short Term Goals: Week 1: SLP Short Term Goal 1 (Week 1): Pt to complete moderate level functional math tasks at mod I level due to increased time.  SLP Short Term Goal 2 (Week 1): Pt to complete functional reasoning activities such as deductive reasoning/scheduling at mod I level with compensatory strategies. SLP Short Term Goal 3 (Week 1): Pt to demonstrate adequate alternating and divided attention for successful completion of functional activity with min A.  SLP Short Term Goal 4 (Week 1): Pt to demonstrate reading comprehension at 100% acc at the paragraph to functional level at mod I level.  Skilled Therapeutic Interventions: Pt participated in speech therapy to address cognitive goals. Prior to therapy pt had received a muscle relaxant and this created increased lethargy as the session progressed. The pt remained able to participate, but response times slowed as the session progressed. Moderate level  functional math related to daily schedule were completed with min A for occasional repetition of pertinent details (this was felt to be secondary to meds). Prior to that activity pt was able to complete a working memory task with 5-7 units at mod I level. Mod I for increased time. Difficult to discern what pt needs at discharge will be at this time secondary to skewed performance this morning  from pain/meds. Pt did express some anxiety re: D/C date of Tuesday. Encouraged her that she would not be discharged if it is unsafe and  she has been making great progress and she still has several days. Pt verbalized understanding.   Function:   Cognition Comprehension Comprehension assist level: Follows complex conversation/direction with extra time/assistive device   Expression   Expression assist level: Expresses complex ideas: With extra time/assistive device  Social Interaction Social Interaction assist level: Interacts appropriately with others with medication or extra time (anti-anxiety, antidepressant).  Problem Solving Problem solving assist level: Solves complex 90% of the time/cues < 10% of the time  Memory Memory assist level: Recognizes or recalls 90% of the time/requires cueing < 10% of the time    Pain Pain Assessment Pain Assessment: 0-10 Pain Score: 5  Pain Type: Chronic pain Pain Location: Head Pain Orientation: Lower Pain Descriptors / Indicators: Aching Pain Frequency: Occasional Pain Onset: On-going Patients Stated Pain Goal: 2 Pain Intervention(s): Medication (See eMAR);Repositioned;Cold applied Multiple Pain Sites: No  Therapy/Group: Individual Therapy  Vinetta Bergamo 08/13/2015, 10:47 AM

## 2015-08-13 NOTE — Progress Notes (Cosign Needed)
Social Work Patient ID: Cynthia Murray, female   DOB: 1970/10/17, 45 y.o.   MRN: [id number]   CSW met with pt on 08-12-15 to discuss team conference discussion and pt had heard of targeted d/c date of 08-17-15.  She was originally taken aback by date when the OT shared it with her, but when CSW talked with her afterwards, pt can see the improvement and is encouraged that she can leave soon.  She is questioning how she can go home when they haven't even let her use the bathroom by herself yet.  CSW explained that she will not reach that designation in the hospital until the day before d/c on what we call "grad day."  CSW then talked with pt's sister Levada Dy, via telephone, to update her on the above.  She is planning on pt coming to her home at d/c.  CSW explained that pt's level of care would be modified independent with supervision for car transfers, stairs, showering and that pt would have to plan her day around when someone would be there for her to do those things.  Levada Dy expressed understanding.  CSW will work toward getting pt in to a community clinic in [place] for her primary care, at least until [place] hopefully comes through.  Sister is meeting with financial counselor today to assist pt with application.  CSW will also try to arrange [facility] and DME through charity care program.  Pt should be eligible for the [facility] program, as well.  CSW will continue to follow and assist as needed.

## 2015-08-13 NOTE — Patient Care Conference (Signed)
Inpatient RehabilitationTeam Conference and Plan of Care Update Date: 08/11/2015   Time: 2:00 PM    Patient Name: Cynthia Murray      Medical Record Number: [id number]  Date of Birth: 1970/10/22 Sex: Female         Room/Bed: 4W02C/4W02C-01 Payor Info: Payor: MEDICAID POTENTIAL / Plan: MEDICAID POTENTIAL / Product Type: *No Product type* /    Admitting Diagnosis: Critical illness myopathy   encephalopathy  Admit Date/Time:  08/09/2015  4:07 PM Admission Comments: No comment available   Primary Diagnosis:  <principal problem not specified> Principal Problem: <principal problem not specified>  Patient Active Problem List   Diagnosis Date Noted  . Allergic reaction   . Encephalopathy, metabolic 07/37/1062  . Debility   . Chronic back pain   . Leukocytosis   . Adjustment disorder with mixed anxiety and depressed mood   . Headache, migraine   . Tachycardia   . Tremor   . Generalized anxiety disorder 08/08/2015  . Panic disorder without agoraphobia 08/08/2015  . Major depressive disorder, recurrent episode ([place]) 08/08/2015  . Acute encephalopathy   . Acute delirium   . Anxiety state   . Essential hypertension   . Hypokalemia   . Septic shock ([place])   . CAP (community acquired pneumonia)   . Pulmonary hypertension ([place])   . Chronic pain syndrome   . Critical illness myopathy   . Pneumococcal lobar pneumonia ([place])   . ARDS (adult respiratory distress syndrome) ([place])   . Encounter for tube feeding instruction   . Pneumonia 07/17/2015  . Sepsis ([place]) 07/17/2015  . AKI (acute kidney injury) ([place]) 07/17/2015  . Headache   . Endotracheally intubated   . Lobar pneumonia ([place])   . Acute respiratory failure with hypoxia ([place])   . Lung mass 02/11/2015  . Right ankle pain 05/14/2014  . Left lumbar radiculopathy 05/14/2014  . Essential hypertension, benign 12/11/2013  . Chronic pain 12/11/2013  . Anxiety 12/10/2013  . Depression 12/10/2013  . Migraine 12/10/2013    Expected  Discharge Date: Expected Discharge Date: 08/17/15  Team Members Present: Physician leading conference: Dr. Delice Lesch Social Worker Present: Alfonse Alpers, LCSW Nurse Present: Heather Roberts, RN PT Present: Raylene Everts, PT OT Present: Willeen Cass, OT SLP Present: Windell Moulding, SLP PPS Coordinator present : Daiva Nakayama, RN, CRRN     Current Status/Progress Goal Weekly Team Focus  Medical   Cognitive deficits, delayed processing, and weakness secondary to encephalopathy/debility  Improve cognitive function, labs, pruritis  see above   Bowel/Bladder   Continent of bowel and bladder. LBM 08/08/14  Pt to remain continent of bowel and bladder  Monitor   Swallow/Nutrition/ Hydration             ADL's   Overall Min Assist for transfers and functional mobility, supervision for bathing/dressing  Mod I for BADL, supervision for bathtub transfers  Attention, awareness, balance, activity tolerance   Mobility   Supervision bed mobility, min A for basic transfers and gait  Mod I transfers, supervision gait and stair negotiation  Activity tolerance/endurance, strengthening, balance   Communication             Safety/Cognition/ Behavioral Observations  mod A for functional cognitive tasks, limited attention, frequently self-distracts  mod I  higher level cognition   Pain   No c/o pain  <3  Monitor for nonverbal cues of pain   Skin   Rash to chest, ecchymosis to abdomen, blotches to L arm,   No additional skin  breakdown  Assess skin q shift    Rehab Goals Patient on target to meet rehab goals: Yes Rehab Goals Revised: none *See Care Plan and progress notes for long and short-term goals.  Barriers to Discharge: Cognitive deficits, safety awareness, hypokalemia, pruritis, Psych meds, tremor    Possible Resolutions to Barriers:  Cognition improving, aggressive rehab, supplement K+, adjust psych meds    Discharge Planning/Teaching Needs:  Pt plans to go to her sister's home who lives in  [place] at d/c.  Sister could come in for family education if needed.   Team Discussion:  Pt's OT goals are set for modified independent for ADLs and supervision with transfers to shower.  Pt is aware of slow processing per ST and she uses humor to cope.  ST does not anticipate need for f/u ST after d/c.  PT has modified independent goals for transfers and supervision with gait and stairs.  PT will do BERG with pt, as she continues to have balance issues.  Pt should see psychiatry or neuropsychology prior to her d/c.  Revisions to Treatment Plan:  none   Continued Need for Acute Rehabilitation Level of Care: The patient requires daily medical management by a physician with specialized training in physical medicine and rehabilitation for the following conditions: Daily direction of a multidisciplinary physical rehabilitation program to ensure safe treatment while eliciting the highest outcome that is of practical value to the patient.: Yes Daily medical management of patient stability for increased activity during participation in an intensive rehabilitation regime.: Yes Daily analysis of laboratory values and/or radiology reports with any subsequent need for medication adjustment of medical intervention for : Neurological problems;Mood/behavior problems  Cynthia Murray, Cynthia Murray 08/13/2015, 9:42 AM

## 2015-08-14 ENCOUNTER — Inpatient Hospital Stay (HOSPITAL_COMMUNITY): Payer: Self-pay | Admitting: Occupational Therapy

## 2015-08-14 ENCOUNTER — Inpatient Hospital Stay (HOSPITAL_COMMUNITY): Payer: MEDICAID | Admitting: Physical Therapy

## 2015-08-14 ENCOUNTER — Inpatient Hospital Stay (HOSPITAL_COMMUNITY): Payer: Self-pay | Admitting: Speech Pathology

## 2015-08-14 ENCOUNTER — Inpatient Hospital Stay (HOSPITAL_COMMUNITY): Payer: MEDICAID

## 2015-08-14 DIAGNOSIS — F411 Generalized anxiety disorder: Secondary | ICD-10-CM

## 2015-08-14 NOTE — Consult Note (Addendum)
[facility] Face-to-Face Psychiatry Consult   Reason for Consult:  Depression and anxiety Referring Physician:  Dr Erling Cruz Patient Identification: Cynthia Murray MRN:  [id number] Principal Diagnosis: Generalized anxiety disorder, delirium improving Diagnosis:   Patient Active Problem List   Diagnosis Date Noted  . Allergic reaction [T78.40XA]   . Encephalopathy, metabolic [W09.81] 19/14/7829  . Debility [R53.81]   . Chronic back pain [M54.9, G89.29]   . Leukocytosis [D72.829]   . Adjustment disorder with mixed anxiety and depressed mood [F43.23]   . Headache, migraine [G43.909]   . Tachycardia [R00.0]   . Tremor [R25.1]   . Generalized anxiety disorder [F41.1] 08/08/2015  . Panic disorder without agoraphobia [F41.0] 08/08/2015  . Major depressive disorder, recurrent episode ([place]) [F33.9] 08/08/2015  . Acute encephalopathy [G93.40]   . Acute delirium [R41.0]   . Anxiety state [F41.1]   . Essential hypertension [I10]   . Hypokalemia [E87.6]   . Septic shock ([place]) [A41.9, R65.21]   . CAP (community acquired pneumonia) [J18.9]   . Pulmonary hypertension ([place]) [I27.2]   . Chronic pain syndrome [G89.4]   . 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 Pneumococcal lobar pneumonia ([place]) [J13]   . ARDS (adult respiratory distress syndrome) ([facility]) [J80]   . Encounter for tube feeding instruction [Z71.89]   . Pneumonia [J18.9] 07/17/2015  . Sepsis ([place]) [A41.9] 07/17/2015  . AKI (acute kidney injury) ([place]) [N17.9] 07/17/2015  . Headache [R51]   . Endotracheally intubated [Z78.9]   . Lobar pneumonia ([place]) [J18.1]   . Acute respiratory failure with hypoxia ([place]) [J96.01]   . Lung mass [R91.8] 02/11/2015  . Right ankle pain [M25.571] 05/14/2014  . Left lumbar radiculopathy [M54.16] 05/14/2014  . Essential hypertension, benign [I10] 12/11/2013  . Chronic pain [G89.29] 12/11/2013  . Anxiety [F41.9] 12/10/2013  . Depression [F32.9] 12/10/2013  . Migraine [G43.909] 12/10/2013    Total Time spent  with patient: 30 minutes  Subjective:   Cynthia Murray is a 45 y.o. female patient admitted with change mental status.  HPI:  Patient is 45 year old Caucasian female who was admitted on December 31 22 off chest pain headache and found to have lactic acidosis and hypertension.  Consult was called because patient has history of depression and anxiety.  Patient seen chart reviewed.  Patient admitted feeling much better since yesterday.  She endorse having memory lapses and confusion since she stayed more than 3 weeks in the hospital.  She does not remember very well recent events which she believed due to hospital stay and having confusion.  She mentioned she has double pneumonia that cause sepsis and her memory has been patchy and impaired.  She admitted history of anxiety and depression for more than 20 years and she has been taking medication from her primary care physician.  She has taken Prozac, BuSpar, Klonopin.  She denies any paranoia, hallucination, irritability or any anger.  She appears sad when she find out that her 27 year old cat died when she was in the hospital.  She admitted some time talking about it but denies any active or passive suicidal thoughts or homicidal thought.  She is pleased that she is getting better every day.  She has more strength and slowly and gradually her memory is getting better.  She like to continue her current medication.  She is not interested in counseling.  She is planning to see her primary care physician for medication management.  Upon discharge she is hoping to stay with her sister.  She has no tremors, shakes,  EPS.  She denies any active or passive suicidal thoughts.  Past Psychiatric History: She started seeing psychiatrist when she was in [place] however once medicine stabilizer depression and anxiety, she started seeing primary care physician for medication management.  Patient denies any history of psychiatric inpatient treatment or any suicidal  attempt.  She has history of depression anxiety and panic attack.  She denies any history of psychosis or any hallucination.  Risk to Self:   denies Risk to Others:   denies Prior Inpatient Therapy:   denies Prior Outpatient Therapy:   see above  Past Medical History:  Past Medical History  Diagnosis Date  . Hypertension     Past Surgical History  Procedure Laterality Date  . Hand surgery  1994    left hand  . Removal of cyst between lungs  1998  . Appendectomy  2004  . Cholecystectomy  2004   Family History:  Family History  Problem Relation Age of Onset  . Breast cancer      grandmother  . Heart attack Father   . Depression Mother     father  . Diabetes Father   . Hyperlipidemia Father   . Hypertension Father    Family Psychiatric  History: Patient endorse mother died in 6.  Father has depression, her to Sr. has depression.  Social History:  History  Alcohol Use  . Yes     History  Drug Use No    Social History   Social History  . Marital Status: Single    Spouse Name: N/A  . Number of Children: N/A  . Years of Education: N/A   Social History Main Topics  . Smoking status: Current Every Day Smoker -- 1.00 packs/day for 15 years    Types: Cigarettes  . Smokeless tobacco: Not on file  . Alcohol Use: Yes  . Drug Use: No  . Sexual Activity: Not Currently   Other Topics Concern  . Not on file   Social History Narrative   Additional Social History: Patient is single.  She has no children.  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  Allergies:  No Known Allergies  Labs: No results found for this or any previous visit (from the past 48 hour(s)).  Current Facility-Administered Medications  Medication Dose Route Frequency Provider Last Rate Last Dose  . acetaminophen (TYLENOL) solution 500 mg  500 mg Oral Q6H PRN Bary Leriche, PA-C   500 mg at 08/13/15 0941  . alum & mag hydroxide-simeth  (MAALOX/MYLANTA) 200-200-20 MG/5ML suspension 30 mL  30 mL Oral Q4H PRN Bary Leriche, PA-C      . bisacodyl (DULCOLAX) suppository 10 mg  10 mg Rectal Daily PRN Bary Leriche, PA-C      . busPIRone (BUSPAR) tablet 10 mg  10 mg Oral BID Ivan Anchors Love, PA-C   10 mg at 08/14/15 0901  . butalbital-acetaminophen-caffeine (FIORICET, ESGIC) 50-325-40 MG per tablet 1-2 tablet  1-2 tablet Oral Q4H PRN Bary Leriche, PA-C      . clonazePAM [place]bye [place]) tablet 0.5 mg  0.5 mg Oral TID PRN Bary Leriche, PA-C   0.5 mg at 08/13/15 2019  . enoxaparin (LOVENOX) injection 40 mg  40 mg Subcutaneous Q24H Ankit Lorie Phenix, MD   40 mg at 08/13/15 2019  . famotidine (PEPCID) tablet 20 mg  20 mg Oral BID Ivan Anchors Love, PA-C   20 mg  at 08/14/15 0900  . feeding supplement (GLUCERNA SHAKE) (GLUCERNA SHAKE) liquid 237 mL  237 mL Oral BID BM Ivan Anchors Love, PA-C   237 mL at 08/14/15 1631  . FLUoxetine (PROZAC) capsule 20 mg  20 mg Oral Daily Ankit Lorie Phenix, MD   20 mg at 08/14/15 0900  . guaiFENesin-dextromethorphan (ROBITUSSIN DM) 100-10 MG/5ML syrup 5-10 mL  5-10 mL Oral Q6H PRN Bary Leriche, PA-C      . methocarbamol (ROBAXIN) tablet 500 mg  500 mg Oral Q6H PRN Ivan Anchors Love, PA-C   500 mg at 08/14/15 1450  . polyethylene glycol (MIRALAX / GLYCOLAX) packet 17 g  17 g Oral Daily PRN Bary Leriche, PA-C      . promethazine (PHENERGAN) tablet 12.5 mg  12.5 mg Oral Q6H PRN Bary Leriche, PA-C       Or  . promethazine (PHENERGAN) suppository 12.5 mg  12.5 mg Rectal Q6H PRN Bary Leriche, PA-C       Or  . promethazine (PHENERGAN) injection 12.5 mg  12.5 mg Intramuscular Q6H PRN Bary Leriche, PA-C      . propranolol (INDERAL) tablet 10 mg  10 mg Oral TID Ankit Lorie Phenix, MD   10 mg at 08/14/15 1453  . sodium phosphate (FLEET) 7-19 GM/118ML enema 1 enema  1 enema Rectal Once PRN Bary Leriche, PA-C      . traMADol Veatrice [place]) tablet 50 mg  50 mg Oral Q6H PRN Ivan Anchors Love, PA-C   50 mg at 08/14/15 1451  . traZODone (DESYREL)  tablet 100 mg  100 mg Oral QHS PRN Bary Leriche, PA-C   100 mg at 08/13/15 2208  . triamcinolone ointment (KENALOG) 0.5 %   Topical TID Bary Leriche, PA-C        Musculoskeletal: Strength & Muscle Tone: within normal limits Gait & Station: Patient lying on the bed Patient leans: N/A  Psychiatric Specialty Exam: ROS  Blood pressure 112/65, pulse 95, temperature 98.5 F (36.9 C), temperature source Oral, resp. rate 18, weight 111.041 kg (244 lb 12.8 oz), last menstrual period 07/31/2015, SpO2 99 %.Body mass index is 38.33 kg/(m^2).  General Appearance: Casual and Fairly Groomed  Engineer, water::  Fair  Speech:  Normal Rate  Volume:  Normal  Mood:  Anxious  Affect:  Congruent  Thought Process:  Coherent  Orientation:  Full (Time, Place, and Person)  Thought Content:  WDL  Suicidal Thoughts:  No  Homicidal Thoughts:  No  Memory:  Immediate;   Fair Recent;   Poor Remote;   Fair  Judgement:  Intact  Insight:  Fair  Psychomotor Activity:  Normal  Concentration:  Fair  Recall:  [employer] of Knowledge:Fair  Language: Fair  Akathisia:  No  Handed:  Right  AIMS (if indicated):     Assets:  Communication Skills Desire for Improvement Financial Resources/Insurance Housing Social Support  ADL's:  Intact  Cognition: Impaired,  Mild  Sleep:      Treatment Plan Summary: Medication management and Plan Continue current psychiatric treatment.  Patient does not have any side effects.  Continue Prozac 20 mg daily, BuSpar 10 mg twice a day, Klonopin 0.5 mg 3 times a day.  Patient can follow-up with her primary care physician for medication management.  She is not interested in counseling at this time.  Disposition: No evidence of imminent risk to self or others at present.   Patient does not meet criteria for psychiatric inpatient  admission. Supportive therapy provided about ongoing stressors. Discussed crisis plan, support from social network, calling 911, coming to the Emergency  Department, and calling Suicide Hotline.  Lyal Husted T. 08/14/2015 3:16 PM

## 2015-08-14 NOTE — Progress Notes (Signed)
Physical Therapy Session Note  Patient Details  Name: Cynthia Murray MRN: [id number] Date of Birth: 1970/08/18  Today's Date: 08/14/2015 PT Individual Time: 1103-1200 and 1345-1406 PT Individual Time Calculation (min): 57 min and 21 minutes; missed 9 minutes due to fatigue  Short Term Goals: Week 1:  PT Short Term Goal 1 (Week 1): = LTGs due to anticipated LOS  Skilled Therapeutic Interventions/Progress Updates:   Pt received seated EOB; discussed goals of therapy sessions today and Monday as Grad day to prepare for D/C Tuesday as well as goal to make pt Mod I in room on Monday.  Pt agreeable and eager to get started.  Performed ambulation with RW x 150' x 4-5 reps with supervision with 1-2 seated rest breaks due to LE fatigue and one episode of L knee genu recurvatum with pt able to self correct LOB.   Performed simulated car transfer training with RW and supervision with pt demonstrating increased use of UE to stand from low seat.  Performed stair negotiation training with pt performing up/down 4 stairs (6") x 2 reps with bilat UE support on rails with supervision with pt self selecting step to negotiation to ascend and alternating to descend with increased reliance on UE to ascend.  Half way through first session pt reporting need to use toilet; ambulated to restroom where pt performed toilet transfer and all toileting tasks with distant supervision. Returned to gym and pt provided with handouts of HEP including supine trunk rotations and pelvic tilts and OTAGO LE strengthening and balance exercises.  Pt performed supine mat exercises and will complete standing exercises this pm.  Returned to room in w/c due to LE fatigue but transferred w/c > bed stand pivot without AD and with supervision.  Pt to sit EOB for lunch.  PM session: received from SLP.  Pt reporting fatigue from am session but willing to participate to tolerance.  Pt performed bed mobility mod I and ambulated to toilet with RW and  supervision; performed all toileting tasks with supervision.  Returned to sitting in chair to perform OTAGO standing LE exercises from handout.  Pt able to perform 4-8 reps of each exercise with a sitting rest break in between each exercise but during heel/toe raises pt reported not being able to perform anymore exercises and requested to return to bed due to fatigue.  Pt transferred to bed with supervision and left with all items within reach.  Therapy Documentation Precautions:  Precautions Precautions: Fall Restrictions Weight Bearing Restrictions: No Pain: Pain Assessment Pain Assessment: No/denies pain  See Function Navigator for Current Functional Status.   Therapy/Group: Individual Therapy  Raylene Everts [place] 08/14/2015, 12:12 PM

## 2015-08-14 NOTE — Progress Notes (Signed)
Speech Language Pathology Daily Session Note  Patient Details  Name: Cynthia Murray MRN: [id number] Date of Birth: 12/09/1970  Today's Date: 08/14/2015 SLP Individual Time: 1300-1345 SLP Individual Time Calculation (min): 45 min  Short Term Goals: Week 1: SLP Short Term Goal 1 (Week 1): Pt to complete moderate level functional math tasks at mod I level due to increased time.  SLP Short Term Goal 2 (Week 1): Pt to complete functional reasoning activities such as deductive reasoning/scheduling at mod I level with compensatory strategies. SLP Short Term Goal 3 (Week 1): Pt to demonstrate adequate alternating and divided attention for successful completion of functional activity with min A.  SLP Short Term Goal 4 (Week 1): Pt to demonstrate reading comprehension at 100% acc at the paragraph to functional level at mod I level.  Skilled Therapeutic Interventions: Skilled treatment session focused on cognitive goals. Patient demonstrated reading comprehension at the paragraph level with 100% accuracy and required supervision verbal cues for redirection to tasks due to verbosity. Patient left supine in bed with all needs within reach. Continue with current plan of care.    Function:  Cognition Comprehension Comprehension assist level: Follows complex conversation/direction with extra time/assistive device  Expression   Expression assist level: Expresses complex ideas: With no assist  Social Interaction Social Interaction assist level: Interacts appropriately with others with medication or extra time (anti-anxiety, antidepressant).  Problem Solving Problem solving assist level: Solves basic 90% of the time/requires cueing < 10% of the time  Memory Memory assist level: Recognizes or recalls 90% of the time/requires cueing < 10% of the time    Pain No reports of pain   Therapy/Group: Individual Therapy  Berklee Battey 08/14/2015, 3:40 PM

## 2015-08-14 NOTE — Progress Notes (Signed)
Occupational Therapy Session Note  Patient Details  Name: Cynthia Murray MRN: [id number] Date of Birth: 07-Dec-1970  Today's Date: 08/14/2015 OT Individual Time: 0900-1000 OT Individual Time Calculation (min): 60 min    Short Term Goals: Week 1:  OT Short Term Goal 1 (Week 1): STG=LTG d/t short anticipated LOS  Skilled Therapeutic Interventions/Progress Updates: ADL-retraining with focus on improved endurance, dynamic standing balance, safety awareness and discharge planning.   Pt received supine in bed and reporting need to toilet.  Pt ambulates to bathroom with setup to provide RW and supervision, performing all BADL with min vc for safety and intermittent re-ed on use energy conservation strategies.   Pt aware of d/c planned for 08/17/15.   Pt reports use of AE provided (reacher) as enabling safe recovery of items dropped on the floor.   No LOB noted during session; pt retains ability to organize and self-monitor symptoms of fatigue and she takes breaks seated as necessary when out-of-breath.    Therapy Documentation Precautions:  Precautions Precautions: Fall Restrictions Weight Bearing Restrictions: No  Pain: Pain Assessment Pain Assessment: No/denies pain  ADL: ADL ADL Comments: see Functional Assessment Tool  See Function Navigator for Current Functional Status.   Therapy/Group: Individual Therapy  Makinzie Considine 08/14/2015, 1:08 PM

## 2015-08-14 NOTE — Progress Notes (Signed)
Cynthia Murray is a 45 y.o. female 1971-05-09 [id number]  Subjective: No new complaints. No new problems. Slept well. Feeling OK.  Objective: Vital signs in last 24 hours: Temp:  [98 F (36.7 C)-98.9 F (37.2 C)] 98.9 F (37.2 C) (01/28 0425) Pulse Rate:  [94-107] 107 (01/28 0425) Resp:  [18] 18 (01/28 0425) BP: (113-118)/(69-79) 118/69 mmHg (01/28 0425) SpO2:  [96 %-97 %] 96 % (01/28 0425) Weight change:  Last BM Date: 08/13/15  Intake/Output from previous day: 01/27 0701 - 01/28 0700 In: 1080 [P.O.:1080] Out: -  Last cbgs: CBG (last 3)  No results for input(s): GLUCAP in the last 72 hours.   Physical Exam General: No apparent distress  Obese HEENT: not dry Lungs: Normal effort. Lungs clear to auscultation, no crackles or wheezes. Cardiovascular: Regular rate and rhythm, no edema Abdomen: S/NT/ND; BS(+) Musculoskeletal:  unchanged Neurological: No new neurological deficits Wounds: N/A    Skin: clear   Mental state: Alert, oriented, cooperative In a w/c   Lab Results: BMET    Component Value Date/Time   NA 141 08/12/2015 0653   K 3.6 08/12/2015 0653   CL 109 08/12/2015 0653   CO2 25 08/12/2015 0653   GLUCOSE 102* 08/12/2015 0653   BUN 8 08/12/2015 0653   CREATININE 0.48 08/12/2015 0653   CREATININE 0.56 12/15/2014 1623   CALCIUM 9.2 08/12/2015 0653   GFRNONAA >60 08/12/2015 0653   GFRAA >60 08/12/2015 0653   CBC    Component Value Date/Time   WBC 8.3 08/12/2015 0653   RBC 3.72* 08/12/2015 0653   HGB 11.2* 08/12/2015 0653   HCT 34.0* 08/12/2015 0653   PLT 167 08/12/2015 0653   MCV 91.4 08/12/2015 0653   MCH 30.1 08/12/2015 0653   MCHC 32.9 08/12/2015 0653   RDW 14.4 08/12/2015 0653   LYMPHSABS 3.6 08/12/2015 0653   MONOABS 0.6 08/12/2015 0653   EOSABS 0.8* 08/12/2015 0653   BASOSABS 0.0 08/12/2015 0653    Studies/Results: No results found.  Medications: I have reviewed the patient's current medications.  Assessment/Plan:  1.  Cognitive deficits, delayed processing, and weakness secondary to encephalopathy/debility. Cont CIR 2. DVT Prophylaxis/Anticoagulation: Pharmaceutical: Lovenox 3. Chronic back pain/Pain Management: Used tramadol prn with muscle relaxers.  4. Mood: LCSW to follow for evaluation and support.  5. Neuropsych: This patient is not fully capable of making decisions on her own behalf. 6. Skin/Wound Care:  Marker allergy - monitor lesions on left forearm (improving). Steroid cream ordered 1/24 7. Fluids/Electrolytes/Nutrition: Monitor I/O. Po intake improving.  Hypokalemia: Repleted 1/24. 3.6 on 1/26 8. Leucocytosis: Resolved WNL on 1/26.  Will cont to monitor periodically 9. Anxiety d/o with depression:  Continue Prozac 20 mg with buspar bid.  Will have Psych see pt to establish care, recs, and for outpt follow up 10. Migranes: on fiorecet prn.: Controlled at present 11. Encephalopathy: Ammonia WNL on 1/24. 12. Tachycardia/tremors (improving):  Likely due to deconditioning.  Cont low dose BB.     Length of stay, days: 5  Walker Kehr , MD 08/14/2015, 11:46 AM

## 2015-08-15 DIAGNOSIS — G43009 Migraine without aura, not intractable, without status migrainosus: Secondary | ICD-10-CM

## 2015-08-15 NOTE — Progress Notes (Signed)
Cynthia Murray is a 45 y.o. female 10/26/1970 [id number]  Subjective: No new complaints. No new problems. Slept well. Feeling OK. Pt is resting this am  Objective: Vital signs in last 24 hours: Temp:  [98.1 F (36.7 C)-98.5 F (36.9 C)] 98.1 F (36.7 C) (01/29 0617) Pulse Rate:  [88-95] 94 (01/29 0930) Resp:  [18] 18 (01/29 0617) BP: (107-116)/(63-65) 107/64 mmHg (01/29 0930) SpO2:  [97 %-99 %] 97 % (01/29 0617) Weight change:  Last BM Date: 08/14/15  Intake/Output from previous day: 01/28 0701 - 01/29 0700 In: 1440 [P.O.:1440] Out: -  Last cbgs: CBG (last 3)  No results for input(s): GLUCAP in the last 72 hours.   Physical Exam General: No apparent distress  Obese HEENT: not dry Lungs: Normal effort. Lungs clear to auscultation, no crackles or wheezes. Cardiovascular: Regular rate and rhythm, no edema Abdomen: S/NT/ND; BS(+) Musculoskeletal:  unchanged Neurological: No new neurological deficits Wounds: N/A    Skin: clear   Mental state: Alert, oriented, cooperative In bed resting   Lab Results: BMET    Component Value Date/Time   NA 141 08/12/2015 0653   K 3.6 08/12/2015 0653   CL 109 08/12/2015 0653   CO2 25 08/12/2015 0653   GLUCOSE 102* 08/12/2015 0653   BUN 8 08/12/2015 0653   CREATININE 0.48 08/12/2015 0653   CREATININE 0.56 12/15/2014 1623   CALCIUM 9.2 08/12/2015 0653   GFRNONAA >60 08/12/2015 0653   GFRAA >60 08/12/2015 0653   CBC    Component Value Date/Time   WBC 8.3 08/12/2015 0653   RBC 3.72* 08/12/2015 0653   HGB 11.2* 08/12/2015 0653   HCT 34.0* 08/12/2015 0653   PLT 167 08/12/2015 0653   MCV 91.4 08/12/2015 0653   MCH 30.1 08/12/2015 0653   MCHC 32.9 08/12/2015 0653   RDW 14.4 08/12/2015 0653   LYMPHSABS 3.6 08/12/2015 0653   MONOABS 0.6 08/12/2015 0653   EOSABS 0.8* 08/12/2015 0653   BASOSABS 0.0 08/12/2015 0653    Studies/Results: No results found.  Medications: I have reviewed the patient's current  medications.  Assessment/Plan:  1. Cognitive deficits, delayed processing, and weakness secondary to encephalopathy/debility. Cont CIR 2. DVT Prophylaxis/Anticoagulation: Pharmaceutical: Lovenox 3. Chronic back pain/Pain Management: Used tramadol prn with muscle relaxers.  4. Mood: LCSW to follow for evaluation and support.  5. Neuropsych: This patient is not fully capable of making decisions on her own behalf. 6. Skin/Wound Care:  Marker allergy - monitor lesions on left forearm (improving). Steroid cream  7. Fluids/Electrolytes/Nutrition: Monitor I/O. Po intake improving.  Hypokalemia: Repleted 1/24. 3.6 on 1/26 8. Leucocytosis: Resolved WNL on 1/26.  Will cont to monitor periodically 9. Anxiety d/o with depression:  Continue Prozac 20 mg with buspar bid.  Will have Psych see pt to establish care, recs, and for outpt follow up 10. Migranes: on fiorecet prn.: Controlled at present 11. Encephalopathy: Ammonia WNL on 1/24. 12. Tachycardia/tremors (improving):  Likely due to deconditioning.  Cont low dose BB.   Cont current Rx    Length of stay, days: 6  Walker Kehr , MD 08/15/2015, 11:19 AM

## 2015-08-15 NOTE — Progress Notes (Signed)
At 2009, PRN klonipin given to "help settle me down for the night." Taking PRN robaxin and ultram for chronic lower back pain. PRN trazodone given at 2136. Ambulating with SBA using RW. Reports bilateral hand tremors, worse at bedtime. Kenalog cream applied to LUE skin lesions. Excited about progress and discharge on Tuesday. Cynthia Murray

## 2015-08-16 ENCOUNTER — Inpatient Hospital Stay (HOSPITAL_COMMUNITY): Payer: MEDICAID | Admitting: Physical Therapy

## 2015-08-16 ENCOUNTER — Inpatient Hospital Stay (HOSPITAL_COMMUNITY): Payer: Self-pay | Admitting: *Deleted

## 2015-08-16 ENCOUNTER — Inpatient Hospital Stay (HOSPITAL_COMMUNITY): Payer: MEDICAID | Admitting: Speech Pathology

## 2015-08-16 ENCOUNTER — Inpatient Hospital Stay (HOSPITAL_COMMUNITY): Payer: MEDICAID

## 2015-08-16 LAB — CREATININE, SERUM
CREATININE: 0.46 mg/dL (ref 0.44–1.00)
GFR calc Af Amer: >60 mL/min (ref >60)

## 2015-08-16 NOTE — Progress Notes (Signed)
Occupational Therapy Discharge Summary  Patient Details  Name: Cynthia Murray MRN: [id number] Date of Birth: 07/17/1971  Patient has met 34 of 13 long term goals due to improved activity tolerance, improved balance, ability to compensate for deficits, improved attention and improved awareness.  Patient to discharge at overall Modified Independent level with distant supervision recommended for bath transfers.  Patient's care partner is independent to provide the necessary cognitive assistance at discharge.    Pt progressed in area of self-care and functional mobility through admission at [facility] with good effort although she was challenged by memory deficits limiting recognition of social issues likely contributing to gradual decline in function, prior to admission.   Recommend psychological support follow-up, as feasible.  Reasons goals not met: n/a  Recommendation:  Patient does not qualify for skilled OT services in home health or outpatient settings.  Pt provided HEP program and recommendations to independently advance functional skills in the area of BADL, iADL and vocation of interest.  Equipment: Shower chair recommended for out-of-pocket payment, as feasible.  Reasons for discharge: treatment goals met  Patient/family agrees with progress made and goals achieved: Yes  OT Discharge Precautions/Restrictions  Precautions Precautions: Fall Restrictions Weight Bearing Restrictions: No  Vital Signs Therapy Vitals Pulse Rate: 92 BP: 118/64 mmHg  Pain Pain Assessment Pain Assessment: No/denies pain  ADL ADL ADL Comments: see Functional Assessment Tool  Vision/Perception  Vision- History Baseline Vision/History: Wears glasses Patient Visual Report: No change from baseline Vision- Assessment Vision Assessment?: No apparent visual deficits Perception Comments: wfl   Cognition Overall Cognitive Status: Within Functional Limits for tasks assessed Arousal/Alertness:  Awake/alert Orientation Level: Oriented X4 Attention: Alternating Alternating Attention: Appears intact Awareness: Appears intact Problem Solving: Appears intact Reasoning: Appears intact Safety/Judgment: Appears intact  Sensation Sensation Light Touch: Impaired by gross assessment Stereognosis: Appears Intact Hot/Cold: Appears Intact Proprioception: Appears Intact Additional Comments: reports hypersensitivity in both feet and hands Coordination Gross Motor Movements are Fluid and Coordinated: Yes Fine Motor Movements are Fluid and Coordinated: Yes Coordination and Movement Description: gross and [facility] WFL for performance of BADL  Motor  Motor Motor: Within Functional Limits Motor - Discharge Observations: Generalized weakness  Mobility  Bed Mobility Bed Mobility: Supine to Sit;Sit to Supine Supine to Sit: 6: Modified independent (Device/Increase time) Sit to Supine: 6: Modified independent (Device/Increase time) Transfers Transfers: Sit to Stand;Stand to Sit Sit to Stand: 6: Modified independent (Device/Increase time) Stand to Sit: 6: Modified independent (Device/Increase time)   Trunk/Postural Assessment  Cervical Assessment Cervical Assessment: Within Functional Limits Thoracic Assessment Thoracic Assessment: Within Functional Limits Lumbar Assessment Lumbar Assessment: Within Functional Limits Postural Control Postural Control: Within Functional Limits   Balance Balance Balance Assessed: Yes Standardized Balance Assessment Standardized Balance Assessment: Berg Balance Test Berg Balance Test Sit to Stand: Able to stand  independently using hands Standing Unsupported: Able to stand safely 2 minutes Sitting with Back Unsupported but Feet Supported on Floor or Stool: Able to sit safely and securely 2 minutes Stand to Sit: Sits safely with minimal use of hands Transfers: Able to transfer safely, minor use of hands Standing Unsupported with Eyes Closed: Able to  stand 10 seconds with supervision Standing Ubsupported with Feet Together: Able to place feet together independently and stand for 1 minute with supervision From Standing, Reach Forward with Outstretched Arm: Can reach confidently >25 cm (10") From Standing Position, Pick up Object from Floor: Able to pick up shoe safely and easily From Standing Position, Turn to Look Behind Over each Shoulder:  Looks behind from both sides and weight shifts well Turn 360 Degrees: Able to turn 360 degrees safely in 4 seconds or less Standing Unsupported, Alternately Place Feet on Step/Stool: Able to stand independently and complete 8 steps >20 seconds Standing Unsupported, One Foot in Front: Able to take small step independently and hold 30 seconds Standing on One Leg: Able to lift leg independently and hold 5-10 seconds Total Score: 49 Static Standing Balance Static Standing - Balance Support: Bilateral upper extremity supported;No upper extremity supported Static Standing - Level of Assistance: 6: Modified independent (Device/Increase time) Dynamic Standing Balance Dynamic Standing - Balance Support: Bilateral upper extremity supported;No upper extremity supported Dynamic Standing - Level of Assistance: 6: Modified independent (Device/Increase time)  Extremity/Trunk Assessment RUE Assessment RUE Assessment: Within Functional Limits LUE Assessment LUE Assessment: Within Functional Limits   See Function Navigator for Current Functional Status.  [facility] 08/17/2015, 7:15 AM

## 2015-08-16 NOTE — Progress Notes (Signed)
[place] RE[facility]ILITATION     PROGRESS NOTE  Subjective/Complaints:  Pt states she is doing "great" this AM.  She believes her symptoms are improving and is looking forward to being discharged soon.   ROS:  Denies pruritis, CP, SOB, n/v/d.   Objective: Vital Signs: Blood pressure 97/51, pulse 95, temperature 98.2 F (36.8 C), temperature source Oral, resp. rate 18, weight 111.041 kg (244 lb 12.8 oz), last menstrual period 07/31/2015, SpO2 93 %. No results found. No results for input(s): WBC, HGB, HCT, PLT in the last 72 hours.  Recent Labs  08/16/15 0610  CREATININE 0.46   CBG (last 3)  No results for input(s): GLUCAP in the last 72 hours.  Wt Readings from Last 3 Encounters:  08/11/15 111.041 kg (244 lb 12.8 oz)  08/07/15 110.224 kg (243 lb)  06/22/15 119.296 kg (263 lb)    Physical Exam:  BP 97/51 mmHg  Pulse 95  Temp(Src) 98.2 F (36.8 C) (Oral)  Resp 18  Wt 111.041 kg (244 lb 12.8 oz)  SpO2 93%  LMP 07/31/2015 Constitutional: She appears well-developed and well-nourished. NAD. Vital signs reviewed. HENT: Normocephalic and atraumatic.  Eyes: Conjunctivae and EOM are normal. No scleral icterus.  Cardiovascular: Regular rhythm and rate.  Respiratory: Effort normal and breath sounds normal. No respiratory distress. She has no wheezes.  GI: Soft. Bowel sounds are normal. She exhibits no distension. There is no tenderness.  Musculoskeletal: No edema.  No tenderness. Neurological: She is alert and oriented. Ataxic speech (improving) Minimal temors noted today She is able to follow simple motor commands.  Motor: B/l UE 4+/5 proximal to distal B/l LE 4+/5 proximal to distal Skin: Skin is warm and dry.  Left forearm with multiple erythematous round dime size macular areas Psychiatric: Her mood/affect is blunt. Cognition and memory are impaired.   Assessment/Plan: 1. Functional deficits secondary to encephalopathy/debility which require 3+ hours  per day of interdisciplinary therapy in a comprehensive inpatient rehab setting. Physiatrist is providing close team supervision and 24 hour management of active medical problems listed below. Physiatrist and rehab team continue to assess barriers to discharge/monitor patient progress toward functional and medical goals.  Function:  Bathing Bathing position   Position: Shower  Bathing parts Body parts bathed by patient: Right arm, Left arm, Chest, Abdomen, Front perineal area, Right upper leg, Buttocks, Left upper leg, Back, Left lower leg, Right lower leg    Bathing assist Assist Level: Supervision or verbal cues      Upper Body Dressing/Undressing Upper body dressing   What is the patient wearing?: Pull over shirt/dress Bra - Perfomed by patient: Thread/unthread right bra strap, Thread/unthread left bra strap Bra - Perfomed by helper: Hook/unhook bra (pull down sports bra) Pull over shirt/dress - Perfomed by patient: Thread/unthread right sleeve, Thread/unthread left sleeve, Put head through opening, Pull shirt over trunk          Upper body assist Assist Level: Supervision or verbal cues   Set up : To obtain clothing/put away  Lower Body Dressing/Undressing Lower body dressing   What is the patient wearing?: Underwear, Pants, Non-skid slipper socks Underwear - Performed by patient: Thread/unthread right underwear leg, Thread/unthread left underwear leg, Pull underwear up/down   Pants- Performed by patient: Thread/unthread right pants leg, Thread/unthread left pants leg, Pull pants up/down, Fasten/unfasten pants   Non-skid slipper socks- Performed by patient: Don/doff right sock, Don/doff left sock       Shoes - Performed by patient: Don/doff right shoe,  Don/doff left shoe            Lower body assist Assist for lower body dressing: Supervision or verbal cues      Toileting Toileting   Toileting steps completed by patient: Adjust clothing prior to toileting,  Performs perineal hygiene, Adjust clothing after toileting   Toileting Assistive Devices: Grab bar or rail  Toileting assist Assist level: Supervision or verbal cues   Transfers Chair/bed transfer   Chair/bed transfer method: Ambulatory Chair/bed transfer assist level: Supervision or verbal cues Chair/bed transfer assistive device: Medical sales representative     Max distance: 165 Assist level: Supervision or verbal cues   Wheelchair   Type: Manual Max wheelchair distance: 75 Assist Level: Supervision or verbal cues  Cognition Comprehension Comprehension assist level: Follows complex conversation/direction with extra time/assistive device  Expression Expression assist level: Expresses complex ideas: With no assist  Social Interaction Social Interaction assist level: Interacts appropriately with others with medication or extra time (anti-anxiety, antidepressant).  Problem Solving Problem solving assist level: Solves basic 90% of the time/requires cueing < 10% of the time  Memory Memory assist level: Recognizes or recalls 90% of the time/requires cueing < 10% of the time    Medical Problem List and Plan: 1. Cognitive deficits, delayed processing, and weakness secondary to encephalopathy/debility.  Cont CIR 2. DVT Prophylaxis/Anticoagulation: Pharmaceutical: Lovenox 3. Chronic back pain/Pain Management: Used tramadol prn with muscle relaxers.  4. Mood: LCSW to follow for evaluation and support.  5. Neuropsych: This patient is not fully capable of making decisions on her own behalf. 6. Skin/Wound Care:   Marker allergy - monitor lesions on left forearm.   Steroid cream started 1/24 7. Fluids/Electrolytes/Nutrition: Monitor I/O. Po intake improving.   Hypokalemia: Repleted 1/24.  3.6 on 1/26 8. Leucocytosis: Resolved  WNL on 1/26.  9. Anxiety d/o with depression:   Continue Prozac 20 mg with buspar bid, and Klonipin TID  Appreciate Psych consult and recs- cont  meds. 10. Migranes: on fiorecet prn.: Controlled at present 11. Encephalopathy: Ammonia WNL on 1/24. 12. Tachycardia/tremors (improving):   Likely due to deconditioning.   Cont low dose BB.   LOS (Days) 7 A FACE TO FACE EVALUATION WAS PERFORMED  Ankit Lorie Phenix 08/16/2015 8:17 AM

## 2015-08-16 NOTE — Progress Notes (Signed)
Physical Therapy Discharge Summary  Patient Details  Name: Cynthia Murray MRN: [id number] Date of Birth: 12/12/1970  Today's Date: 08/16/2015 PT Individual Time: 1306-1406 PT Individual Time Calculation (min): 60 min    Patient has met 9 of 9 long term goals due to improved activity tolerance, improved balance, improved postural control, increased strength, ability to compensate for deficits, improved attention and improved awareness.  Patient to discharge at an ambulatory level Modified Independent.   Patient's care partner is independent to provide the necessary cognitive and supervision assistance at discharge for community mobility and stair negotiation for home entry/exit.  Reasons goals not met: All goals met  Recommendation:  If patient qualifies, patient will benefit from ongoing skilled PT services in home health setting to continue to advance safe functional mobility, address ongoing impairments in LE and core strength, impaired activity tolerance/endurance, postural control/balance, gait, and minimize fall risk.  Pt also given handouts of HEP for LE/trunk ROM, strengthening and balance.  Equipment: RW  Reasons for discharge: treatment goals met and discharge from hospital  Patient/family agrees with progress made and goals achieved: Yes  PT Discharge Pt received seated EOB.  Pt participated in graduation day activities with focus on bed mobility, basic and car transfers all Mod I with or without UE support on RW, gait with RW in controlled and home environments x 150' mod I, re-assessment of BERG balance assessment, re-assessment of LE strength and sensation, and stair negotiation as described below.  Also reviewed OTAGO balance HEP with pt return demonstrating all exercises safely.  Pt has improved from significant falls risk to moderate falls risk but still demonstrates impaired proximal stability during narrow BOS or single limb stance with shuffling gait.  Discussed falls  risk and safety recommendations for home.  Pt returned to room and performed toileting and all toileting tasks mod I.  Pt left with SLP at end of session.  Pain Pain Assessment Pain Assessment: No/denies pain Sensation Sensation Light Touch: Impaired by gross assessment Stereognosis: Not tested Hot/Cold: Not tested Proprioception: Appears Intact Additional Comments: reports hypersensitivity in both feet and hands Coordination Gross Motor Movements are Fluid and Coordinated: Not tested Fine Motor Movements are Fluid and Coordinated: Not tested Motor  Motor Motor - Discharge Observations: Generalized weakness  Mobility Bed Mobility Bed Mobility: Supine to Sit;Sit to Supine Supine to Sit: 6: Modified independent (Device/Increase time) Sit to Supine: 6: Modified independent (Device/Increase time) Transfers Transfers: Yes Sit to Stand: 6: Modified independent (Device/Increase time) Stand to Sit: 6: Modified independent (Device/Increase time) Stand Pivot Transfers: 6: Modified independent (Device/Increase time) Locomotion  Ambulation Ambulation/Gait Assistance: 6: Modified independent (Device/Increase time) Ambulation Distance (Feet): 150 Feet Assistive device: Rolling walker;None Gait Gait: Yes Gait Pattern: Impaired Gait Pattern: Step-through pattern;Decreased stance time - right;Decreased dorsiflexion - right;Decreased dorsiflexion - left;Decreased weight shift to right;Shuffle;Lateral hip instability;Lateral trunk lean to right;Lateral trunk lean to left;Wide base of support;Poor foot clearance - left;Poor foot clearance - right Stairs / Additional Locomotion Stairs: Yes Stairs Assistance: 6: Modified independent (Device/Increase time) Stair Management Technique: Two rails;Alternating pattern;Step to pattern;Forwards Number of Stairs: 12 Height of Stairs: 6 Wheelchair Mobility Wheelchair Mobility: No  Trunk/Postural Assessment  Cervical Assessment Cervical Assessment:  Within Functional Limits Thoracic Assessment Thoracic Assessment: Within Functional Limits Lumbar Assessment Lumbar Assessment: Within Functional Limits Postural Control Postural Control:  (delayed balance strategies due to hip and ankle weakness)  Balance Standardized Balance Assessment Standardized Balance Assessment: Berg Balance Test Berg Balance Test Sit to Stand: Able to stand  independently using hands Standing Unsupported: Able to stand safely 2 minutes Sitting with Back Unsupported but Feet Supported on Floor or Stool: Able to sit safely and securely 2 minutes Stand to Sit: Sits safely with minimal use of hands Transfers: Able to transfer safely, minor use of hands Standing Unsupported with Eyes Closed: Able to stand 10 seconds with supervision Standing Ubsupported with Feet Together: Able to place feet together independently and stand for 1 minute with supervision From Standing, Reach Forward with Outstretched Arm: Can reach confidently >25 cm (10") From Standing Position, Pick up Object from Floor: Able to pick up shoe safely and easily From Standing Position, Turn to Look Behind Over each Shoulder: Looks behind from both sides and weight shifts well Turn 360 Degrees: Able to turn 360 degrees safely in 4 seconds or less Standing Unsupported, Alternately Place Feet on Step/Stool: Able to stand independently and complete 8 steps >20 seconds Standing Unsupported, One Foot in Front: Able to take small step independently and hold 30 seconds Standing on One Leg: Able to lift leg independently and hold 5-10 seconds Total Score: 49 Patient demonstrates increased fall risk as noted by score of  49/56 on Berg Balance Scale.  (<36= high risk for falls, close to 100%; 37-45 significant >80%; 46-51 moderate >50%; 52-55 lower >25%)  Static Standing Balance Static Standing - Balance Support: Bilateral upper extremity supported;No upper extremity supported Static Standing - Level of  Assistance: 6: Modified independent (Device/Increase time) Dynamic Standing Balance Dynamic Standing - Balance Support: Bilateral upper extremity supported;No upper extremity supported Dynamic Standing - Level of Assistance: 6: Modified independent (Device/Increase time) Extremity Assessment  RLE Strength RLE Overall Strength: Deficits RLE Overall Strength Comments: 3/5 hip flexion, 5/5 knee extension, 4/5 knee flexion, 4/5 ankle DF but demonstrates compensatory use of trunk leaning to maintain activation of hip flexion and knee flexion LLE Strength LLE Overall Strength: Deficits LLE Overall Strength Comments: 3/5 hip flexion, 5/5 knee extension, 4/5 knee flexion, 4/5 ankle DF but demonstrates compensatory use of trunk leaning to maintain activation of hip flexion and knee flexion   See Function Navigator for Current Functional Status.  Raylene Everts [place] 08/16/2015, 2:14 PM

## 2015-08-16 NOTE — Progress Notes (Signed)
Speech Language Pathology Discharge Summary  Patient Details  Name: Cynthia Murray MRN: [id number] Date of Birth: March 18, 1971  Today's Date: 08/16/2015 SLP Individual Time: 1400-1500 SLP Individual Time Calculation (min): 60 min   Skilled Therapeutic Interventions:  Pt was seen for skilled ST targeting cognitive goals.  SLP facilitated the session with a semi-complex scheduling task targeting functional problem solving.  Pt was able to complete task for >90% accuracy with mod I, improving to 100% accuracy with x1 supervision cue for error awareness.  Pt attended to task appropriately for its duration with no cues needed for redirection.  SLP also administered version B of the MoCA standardized cognitive assessment to measure progress from initial evaluation.  Pt scored 30 out of 30 on the MoCA which is indicative of grossly intact cognitive-linguistic function and is a marked improvement from her initial score of 26 out of 30.  SLP recommended that pt have at least intermittent assistance to have someone check behind her for medication and financial management as pt reports she still at times feels "foggy headed."  Otherwise pt is at her baseline for cognitive-linguistic function and no further ST needs are indicated.  Pt aware of and in agreement with recommendations.  Pt left at edge of bed with all needs within reach.      Patient has met 2 of 2 long term goals.  Patient to discharge at overall Modified Independent level.  Reasons goals not met: n/a   Clinical Impression/Discharge Summary:  Pt made functional gains and is discharging from CIR having met 2 out of 2 long term goals.  Pt is overall mod I for semi-complex tasks and is at baseline for cognitive function.  All education is complete at this time.  Pt is discharging to her sister's home.  No further ST needs are indicated.       Care Partner:  Caregiver Able to Provide Assistance: Yes  Type of Caregiver Assistance:  Physical;Cognitive  Recommendation:  None      Equipment: none recommended by SLP    Reasons for discharge: Discharged from hospital;Treatment goals met   Patient/Family Agrees with Progress Made and Goals Achieved: Yes   Function:  Eating Eating                 Cognition Comprehension Comprehension assist level: Follows complex conversation/direction with extra time/assistive device  Expression   Expression assist level: Expresses complex ideas: With extra time/assistive device  Social Interaction Social Interaction assist level: Interacts appropriately with others with medication or extra time (anti-anxiety, antidepressant).  Problem Solving Problem solving assist level: Solves complex problems: With extra time  Memory Memory assist level: More than reasonable amount of time   Emilio Math 08/16/2015, 3:25 PM

## 2015-08-16 NOTE — Progress Notes (Signed)
PRN klonipin given at 2045. PRN robaxin, ultram an trazodone  given at 2210. Complains of chronic lower back pain. Cynthia Murray A

## 2015-08-16 NOTE — Progress Notes (Signed)
Recreational Therapy Assessment and Plan  Patient Details  Name: Cynthia Murray MRN: [id number] Date of Birth: [birth year] Today's Date: 08/16/2015 Assessment Clinical Impression:  Problem List:  Patient Active Problem List   Diagnosis Date Noted  . Allergic reaction   . Encephalopathy, metabolic 95/28/4132  . Debility   . Chronic back pain   . Leukocytosis   . Adjustment disorder with mixed anxiety and depressed mood   . Headache, migraine   . Tachycardia   . Tremor   . Generalized anxiety disorder 08/08/2015  . Panic disorder without agoraphobia 08/08/2015  . Major depressive disorder, recurrent episode ([place]) 08/08/2015  . Acute encephalopathy   . Acute delirium   . Anxiety state   . Essential hypertension   . Hypokalemia   . Septic shock ([place])   . CAP (community acquired pneumonia)   . Pulmonary hypertension ([place])   . Chronic pain syndrome   . Critical illness myopathy   . Pneumococcal lobar pneumonia ([place])   . ARDS (adult respiratory distress syndrome) ([place])   . Encounter for tube feeding instruction   . Pneumonia 07/17/2015  . Sepsis ([place]) 07/17/2015  . AKI (acute kidney injury) ([place]) 07/17/2015  . Headache   . Endotracheally intubated   . Lobar pneumonia ([place])   . Acute respiratory failure with hypoxia ([place])   . Lung mass 02/11/2015  . Right ankle pain 05/14/2014  . Left lumbar radiculopathy 05/14/2014  . Essential hypertension, benign 12/11/2013  . Chronic pain 12/11/2013  . Anxiety 12/10/2013  . Depression 12/10/2013  . Migraine 12/10/2013    Past Medical History:  Past Medical History  Diagnosis Date  . Hypertension    Past Surgical History:  Past Surgical History  Procedure Laterality Date  . Hand surgery  1994    left hand  . Removal of cyst between lungs  1998  . Appendectomy  2004  .  Cholecystectomy  2004    Assessment & Plan Clinical Impression: Cynthia Murray is a 45 y.o. right handed female with history of hypertension, chronic pain, depression, anxiety disorder who was admitted via [facility] on 07/17/2015 with dyspnea, cough, chest pain and headache. She was noted to have lactic acidosis with hypotension and AKI. CT of the chest showed lobar pneumonia of the left upper lobe with developing bronchial pneumonia throughout the remaining portions of the lungs. Cranial CT scan negative. She was started on IV antibiotics, pressors and intubated due to ARDS. She has had issues with delirium and EEG done showing global cerebral dysfunction consistent with non-specific encephalopathy. Blood cultures and urine cultures negative. She tolerated extubation on 07/28/2015 but developed paranoia with confused language. Ammonia levels elevated at 83 with sodium 150. Neurology felt that changes were due to metabolic derangement and should improve with correction of abnormality. MRI brain negative for acute changes and psychiatry consulted for input on management of anxiety, depression and panic disorder. Dr. Darleene Cleaver has been following for supportive therapy and Buspar was increased to 10 mg bid with recommendations to decrease Klonopin use. She continues to have cognitive deficits with delayed processing, poor safety awareness and requires multimodal cues for transfers. Therapy ongoing and CIR recommended due to debilitated state. Patient transferred to CIR on 08/09/2015.      Met with pt to discuss leisure interests and importance of staying active and engaged in leisure activities as she transitions home with family.  Discussion included the physical, social, psychological & cognitive benefits of leisure participation.  Pt reports feeling anxious to get home  with family and regain further independence.  No further TR as pt is scheduled for discharge tomorrow.  The above  assessment, treatment plan, treatment alternatives and goals were discussed and mutually agreed upon: by patient  [place] 08/16/2015, 3:00 PM

## 2015-08-16 NOTE — Progress Notes (Signed)
Occupational Therapy Session Note  Patient Details  Name: Cynthia Murray MRN: [id number] Date of Birth: [birth year]  Today's Date: 08/16/2015 OT Individual Time: 1624-4695 OT Individual Time Calculation (min): 75 min    Short Term Goals: Week 1:  OT Short Term Goal 1 (Week 1): STG=LTG d/t short anticipated LOS  Skilled Therapeutic Interventions/Progress Updates: ADL-retraining (60 min) with focus on improved dynamic standing balance, functional mobility, transfers and effective use of DME/AE.   Pt received supine in bed with breakfast tray present but not preferred by pt.   Pt requests Glucerna d/t poor intake, which was retrieved while pt bathed.   Pt completed bed mobility, transfer to RW, and ambulated to bathroom to toilet, bathe, and partially dress unassisted, requiring only use of RW for mobility, and tub bench for energy conservation.  Pt recovered to EOB to rest and completed dressing as therapist educated pt on home exercise program (HEP).  Pt groomed standing and returned to EOB for HEP.   Therapeutic exercises (15 min) with focus BUE and general strengthening of shoulder girdle.   OT provided 1:1 instruction using theraband (orange, level 2) and written HEP using the following exerices: chest press, seated row, scapular chest pull, scapular pull down, bicep curl, tricep press down, tricep press forward.   Pt able to duplicate exercises correctly with intermittent hand-over-hand reinforcement instructions.     Therapy Documentation Precautions:  Precautions Precautions: Fall Restrictions Weight Bearing Restrictions: No  Vital Signs: Therapy Vitals Pulse Rate: 92 BP: 118/64 mmHg   Pain: No/denies pain   ADL: ADL ADL Comments: see Functional Assessment Tool  See Function Navigator for Current Functional Status.   Therapy/Group: Individual Therapy  [place] 08/16/2015, 12:29 PM

## 2015-08-17 MED ORDER — CLONAZEPAM 0.5 MG PO TABS: 0.5000 mg | ORAL_TABLET | Freq: Three times a day (TID) | ORAL | Status: DC | PRN

## 2015-08-17 MED ORDER — TRAMADOL HCL 50 MG PO TABS: 50.0000 mg | ORAL_TABLET | Freq: Four times a day (QID) | ORAL | Status: DC | PRN

## 2015-08-17 MED ORDER — METHOCARBAMOL 500 MG PO TABS: 500.0000 mg | ORAL_TABLET | Freq: Four times a day (QID) | ORAL | Status: DC | PRN

## 2015-08-17 MED ORDER — PROPRANOLOL HCL 10 MG PO TABS: 10.0000 mg | ORAL_TABLET | Freq: Three times a day (TID) | ORAL | Status: DC

## 2015-08-17 MED ORDER — TRAZODONE HCL 100 MG PO TABS: 100.0000 mg | ORAL_TABLET | Freq: Every evening | ORAL | Status: DC | PRN

## 2015-08-17 MED ORDER — TRIAMCINOLONE ACETONIDE 0.5 % EX OINT: TOPICAL_OINTMENT | Freq: Three times a day (TID) | CUTANEOUS | Status: DC

## 2015-08-17 MED ORDER — BUSPIRONE HCL 10 MG PO TABS: ORAL_TABLET | ORAL | Status: DC

## 2015-08-17 MED ORDER — FLUOXETINE HCL 20 MG PO CAPS: 20.0000 mg | ORAL_CAPSULE | Freq: Every day | ORAL | Status: DC

## 2015-08-17 NOTE — Discharge Instructions (Signed)
Inpatient Rehab Discharge Instructions  Finn Altemose Discharge date and time:  08/17/15  Activities/Precautions/ Functional Status: Activity: activity as tolerated No Driving.  Diet: diabetic diet Wound Care: keep wound clean and dry   Functional status:  ___ No restrictions     ___ Walk up steps independently ___ 24/7 supervision/assistance   ___ Walk up steps with assistance _X__ Intermittent supervision/assistance  ___ Bathe/dress independently ___ Walk with walker     ___ Bathe/dress with assistance ___ Walk Independently    ___ Shower independently ___ Walk with assistance    ___ Shower with assistance _X__ No alcohol     ___ Return to work/school ________  Special Instructions: 1. Needs supervision for shower/bath transfers.  2. No driving till cleared by MD.    COMMUNITY REFERRALS UPON DISCHARGE:    HOME EXERCISE PROGRAM GIVEN TO PATIENT FROM TEAM [place] NOT QUALIFY FOR FOLLOW UP HOME HEALTH OR OUTPATIENT REHAB SERVICES  Medical Equipment/Items Ordered: [place]   [id number] Other:  [place] program for one month supply of medications              [facility] in [place] - [phone] - community clinic can assist with doctors and medications until your Medicaid hopefully comes through   [place] PATIENT/FAMILY: Mental Health: [facility] in [place] - [phone]    My questions have been answered and I understand these instructions. I will adhere to these goals and the provided educational materials after my discharge from the hospital.  Patient/Caregiver Signature _______________________________ Date __________  Clinician Signature _______________________________________ Date __________  Please bring this form and your medication list with you to all your follow-up doctor's appointments.

## 2015-08-17 NOTE — Progress Notes (Signed)
Social Work Discharge Note  The overall goal for the admission was met for:   Discharge location: Yes - home with sister  Length of Stay: Yes - 8 days  Discharge activity level: Yes - modified independent with supervision for bathing, car transfers  Home/community participation: Yes  Services provided included: MD, RD, PT, OT, SLP, RN, TR, Pharmacy and SW  Financial Services: Other: Pt applied for Medicaid while in the hospital  Follow-up services arranged: DME: rolling walker and shower seat and Patient/Family has no preference for HH/DME agencies.  Used [facility] for charity care DME due to [facility] pending.  Comments (or additional information): Pt to d/c to her sister's home [facility] Dy) with a home therapy program due to not meeting Medicaid criteria for home therapies.  CSW gave pt information on [facility] (clinic in [place]) so they can assist uninsured pt with MD visits and medication prior to pt's hopefully getting approved for Medicaid.  Also gave pt number for [facility] for ongoing psychiatric needs (anxiety and depression) and signed pt up for [facility] program to assist with one month's supply of non-controlled medications.  Reviewed the above with pt.  Patient/Family verbalized understanding of follow-up arrangements: Yes  Individual responsible for coordination of the follow-up plan: pt with her sister's help [facility])  Confirmed correct DME delivered: Trey Sailors 08/17/2015    Cecylia Brazill, [name]

## 2015-08-17 NOTE — Discharge Summary (Signed)
Physician Discharge Summary  Patient ID: Cynthia Murray MRN: [id number] DOB/AGE: 1971-06-13 45 y.o.  Admit date: 08/09/2015 Discharge date: 08/19/2015  Discharge Diagnoses:  Principal Problem:   Encephalopathy, metabolic Active Problems:   Generalized anxiety disorder   Debility   Chronic back pain   Leukocytosis   Adjustment disorder with mixed anxiety and depressed mood   Headache, migraine   Tachycardia   Tremor   Allergic reaction   Discharged Condition:  Stable.     Labs:  Basic Metabolic Panel:  Recent Labs Lab 08/16/15 0610  CREATININE 0.46    CBC: No results for input(s): WBC, NEUTROABS, HGB, HCT, MCV, PLT in the last 168 hours.  CBG: No results for input(s): GLUCAP in the last 168 hours.  Brief HPI:   Cynthia Murray is a 45 y.o. right handed female with history of hypertension, chronic pain, depression, anxiety disorder who was admitted via [facility] on 07/17/2015 with dyspnea, cough, chest pain and headache. She was noted to be septic due to PNA and developed ARDS requiring intubation, pressors and IV antibiotics.  She has had issues with delirium due to encephalopathy and Blood cultures/ urine cultures were negative. She tolerated extubation on 07/28/2015 but developed paranoia with confused language. Ammonia levels elevated at 83 with sodium 150. Neurology felt that changes were due to metabolic derangement and should improve with correction of abnormality. MRI brain negative for acute changes and psychiatry has assistance with management of anxiety, depression and panic disorder.  She continued to have cognitive deficits with delayed processing, poor safety awareness and deficits with mobility. CIR was recommended due to debilitated state   Hospital Course: Cynthia Murray was admitted to rehab 08/09/2015 for inpatient therapies to consist of PT, ST and OT at least three hours five days a week. Past admission physiatrist, therapy team and rehab RN have  worked together to provide customized collaborative inpatient rehab. Anxiety has been welll controlled and she has shown good participation in therapy. Blood pressures have been well controlled. Metoprolol was changed to inderal to help manage tremors as well as headaches. Po intake has been good and she is continent of bowel and bladder. Follow up labs showed that hypokalemia has resolved. CBC shows H/H is stable without leucocytosis. Respiratory status has been stable and complaints of cough reported.  She continues to have poor recall of past events and denies recall of events of two months prior to hospitalization but overall anxiety and attention deficits have greatly improved.   Dr. Adele Schilder with psychiatry was consulted for follow up and input on medication management. Patient denied any passive suicidal or homicidal thoughts and generalized anxiety disorder was stable. She reported that she was not interested in counseling after discharge but has been encouraged to follow up with [facility] in [place]. She has been stable on Buspar tid, Prozac 20 mg/daily and Klonopin on tid prn.  Insomnia has been managed with use of trazodone at bedtime. LCSW has been in contact with patient and family for support and assistance with Patient and family were given resources for follow up with Clinic in [place] as well as [facility] for psychological follow up/assistance. She has made good progress during her rehab stay and was modified independent in supervised setting.  She was educated on HEP to continue to work on balance and strengthening.    Rehab course: During patient's stay in rehab weekly team conferences were held to monitor patient's progress, set goals and discuss barriers to discharge. At admission, patient required min assist  with basic self care tasks and min to max assist with mobility. She has had improvement in activity tolerance, balance, postural control, as well as ability to compensate for deficits. She is able to  complete ADL tasks at modified independent level with distant supervision needed for bath transfers. Cognitive deficits have improved and she is modified independent for semi-complex tasks and is at baseline function. She is ambulating 150' with RW at modified independent level.  BERG balance score has improved to 49/56 and she has been educated on OTAGO balance HEP. Family education was done with sister with recommendations for supervision when out of home environment. Sister to provide intermittent assistance as well as cognitive assistance as needed after discharge.     Disposition: 01-Home or Self Care  Diet: Carb modified.   Special Instructions: 1. Needs intermittent supervision. 2. NO driving till cleared by MD. 3.  Follow up with [facility] care clinic and [facility] in [place].     Medication List    STOP taking these medications        butalbital-acetaminophen-caffeine 50-325-40 MG tablet  Commonly known as:  FIORICET, ESGIC     meloxicam 15 MG tablet  Commonly known as:  MOBIC     metoprolol tartrate 25 MG tablet  Commonly known as:  LOPRESSOR      TAKE these medications        busPIRone 10 MG tablet  Commonly known as:  BUSPAR  Take 1 tablet by mouth twice daily     clonazePAM 0.5 MG tablet--Rx #30 pills  Commonly known as:  KLONOPIN  Take 1 tablet (0.5 mg total) by mouth 3 (three) times daily as needed (anxiety).     FLUoxetine 20 MG capsule  Commonly known as:  PROZAC  Take 1 capsule (20 mg total) by mouth daily.     medroxyPROGESTERone 150 MG/ML injection  Commonly known as:  DEPO-PROVERA  Inject 150 mg into the muscle every 3 (three) months. Last injection 06/22/15     methocarbamol 500 MG tablet  Commonly known as:  ROBAXIN  Take 1 tablet (500 mg total) by mouth every 6 (six) hours as needed for muscle spasms.     propranolol 10 MG tablet  Commonly known as:  INDERAL  Take 1 tablet (10 mg total) by mouth 3 (three) times daily.     ranitidine 75  MG tablet  Commonly known as:  ZANTAC  Take 75 mg by mouth 2 (two) times daily.     traMADol 50 MG tablet--Rx # 60 pills  Commonly known as:  ULTRAM  Take 1 tablet (50 mg total) by mouth every 6 (six) hours as needed for moderate pain.     traZODone 100 MG tablet  Commonly known as:  DESYREL  Take 1 tablet (100 mg total) by mouth at bedtime as needed for sleep.     triamcinolone ointment 0.5 %  Commonly known as:  KENALOG  Apply topically 3 (three) times daily.       Follow-up Information    Call Ankit Lorie Phenix, MD.   Specialty:  Physical Medicine and Rehabilitation   Why:   As needed   Contact information:   [address] [address] [phone]       Follow up with Marcial Pacas, DO On 08/25/2015.   Specialty:  Family Medicine   Why:  @ 4 PM   Contact information:   [address] [place] [address] [phone]  SignedBary Leriche 08/19/2015, 11:20 AM

## 2015-08-17 NOTE — Progress Notes (Signed)
Patient discharged to home, accompanied by her sister.

## 2015-08-17 NOTE — Progress Notes (Signed)
PHYSICAL MEDICINE & REHABILITATION     PROGRESS NOTE  Subjective/Complaints:  Pt says she is doing, "great" and is ready to go home today.    ROS:  Denies pruritis, CP, SOB, n/v/d.   Objective: Vital Signs: Blood pressure 116/59, pulse 101, temperature 98.9 F (37.2 C), temperature source Oral, resp. rate 17, weight 111.041 kg (244 lb 12.8 oz), last menstrual period 07/31/2015, SpO2 94 %. No results found. No results for input(s): WBC, HGB, HCT, PLT in the last 72 hours.  Recent Labs  08/16/15 0610  CREATININE 0.46   CBG (last 3)  No results for input(s): GLUCAP in the last 72 hours.  Wt Readings from Last 3 Encounters:  08/11/15 111.041 kg (244 lb 12.8 oz)  08/07/15 110.224 kg (243 lb)  06/22/15 119.296 kg (263 lb)    Physical Exam:  BP 116/59 mmHg  Pulse 101  Temp(Src) 98.9 F (37.2 C) (Oral)  Resp 17  Wt 111.041 kg (244 lb 12.8 oz)  SpO2 94%  LMP 07/31/2015 Constitutional: She appears well-developed and well-nourished. NAD. Vital signs reviewed. HENT: Normocephalic and atraumatic.  Eyes: Conjunctivae and EOM are normal. No scleral icterus.  Cardiovascular: Regular rhythm and rate.  Respiratory: Effort normal and breath sounds normal. No respiratory distress. She has no wheezes.  GI: Soft. Bowel sounds are normal. She exhibits no distension. There is no tenderness.  Musculoskeletal: No edema.  No tenderness. Neurological: She is alert and oriented. Minimal temors noted today She is able to follow simple motor commands.  Motor: B/l UE 4+-5/5 proximal to distal B/l LE 4+-5/5 proximal to distal Skin: Skin is warm and dry.  Left forearm with multiple erythematous round dime size macular areas (significantly improved) Psychiatric: Her mood/affect is blunt. Cognition and memory are impaired.   Assessment/Plan: 1. Functional deficits secondary to encephalopathy/debility which require 3+ hours per day of interdisciplinary therapy in a comprehensive  inpatient rehab setting. Physiatrist is providing close team supervision and 24 hour management of active medical problems listed below. Physiatrist and rehab team continue to assess barriers to discharge/monitor patient progress toward functional and medical goals.  Function:  Bathing Bathing position   Position: Shower  Bathing parts Body parts bathed by patient: Right arm, Left arm, Chest, Abdomen, Front perineal area, Buttocks, Right upper leg, Left upper leg, Right lower leg, Left lower leg    Bathing assist Assist Level: No help, No cues      Upper Body Dressing/Undressing Upper body dressing   What is the patient wearing?: Bra, Pull over shirt/dress Bra - Perfomed by patient: Thread/unthread right bra strap, Thread/unthread left bra strap Bra - Perfomed by helper: Hook/unhook bra (pull down sports bra) (d/t new sports bra (pt unfamiliar with design)) Pull over shirt/dress - Perfomed by patient: Thread/unthread right sleeve, Thread/unthread left sleeve, Put head through opening, Pull shirt over trunk          Upper body assist Assist Level: Touching or steadying assistance(Pt > 75%)   Set up : To obtain clothing/put away  Lower Body Dressing/Undressing Lower body dressing   What is the patient wearing?: Underwear, Pants, Non-skid slipper socks Underwear - Performed by patient: Thread/unthread right underwear leg, Thread/unthread left underwear leg, Pull underwear up/down   Pants- Performed by patient: Thread/unthread right pants leg, Thread/unthread left pants leg, Pull pants up/down, Fasten/unfasten pants   Non-skid slipper socks- Performed by patient: Don/doff right sock, Don/doff left sock       Shoes - Performed by patient: Don/doff right shoe,  Don/doff left shoe            Lower body assist Assist for lower body dressing: More than reasonable time      Toileting Toileting   Toileting steps completed by patient: Adjust clothing prior to toileting, Performs  perineal hygiene, Adjust clothing after toileting   Toileting Assistive Devices: Grab bar or rail  Toileting assist Assist level: No help/no cues   Transfers Chair/bed transfer   Chair/bed transfer method: Ambulatory, Stand pivot Chair/bed transfer assist level: No Help, no cues, assistive device, takes more than a reasonable amount of time Chair/bed transfer assistive device: Medical sales representative     Max distance: 165 Assist level: No help, No cues, assistive device, takes more than a reasonable amount of time   Wheelchair Wheelchair activity did not occur: N/A Type: Manual Max wheelchair distance: 75 Assist Level: Supervision or verbal cues  Cognition Comprehension Comprehension assist level: Follows complex conversation/direction with extra time/assistive device  Expression Expression assist level: Expresses complex ideas: With extra time/assistive device  Social Interaction Social Interaction assist level: Interacts appropriately with others with medication or extra time (anti-anxiety, antidepressant).  Problem Solving Problem solving assist level: Solves complex problems: With extra time  Memory Memory assist level: More than reasonable amount of time    Medical Problem List and Plan: 1. Cognitive deficits, delayed processing, and weakness secondary to encephalopathy/debility.  D/C today 2. DVT Prophylaxis/Anticoagulation: Pharmaceutical: Lovenox 3. Chronic back pain/Pain Management: Used tramadol prn with muscle relaxers.  4. Mood: LCSW to follow for evaluation and support.  5. Neuropsych: This patient is not fully capable of making decisions on her own behalf. 6. Skin/Wound Care:   Marker allergy - monitor lesions on left forearm.   Steroid cream started 1/24 7. Fluids/Electrolytes/Nutrition: Monitor I/O. Po intake improving.   Hypokalemia: Repleted 1/24.  3.6 on 1/26 8. Leucocytosis: Resolved  WNL on 1/26.  9. Anxiety d/o with depression:    Continue Prozac 20 mg with buspar bid, and Klonipin TID  Appreciate Psych consult and recs- cont meds. 10. Migranes: on fiorecet prn.: Controlled at present 11. Encephalopathy: Ammonia WNL on 1/24. 12. Tachycardia/tremors (improving):   Likely due to deconditioning.   Cont low dose BB.   LOS (Days) 8 A FACE TO FACE EVALUATION WAS PERFORMED  Ashawn Rinehart Lorie Phenix 08/17/2015 8:51 AM

## 2015-08-25 ENCOUNTER — Ambulatory Visit (INDEPENDENT_AMBULATORY_CARE_PROVIDER_SITE_OTHER): Payer: Self-pay | Admitting: Family Medicine

## 2015-08-25 ENCOUNTER — Encounter: Payer: Self-pay | Admitting: Family Medicine

## 2015-08-25 VITALS — BP 126/62 | Wt 247.0 lb

## 2015-08-25 DIAGNOSIS — L200 Besnier's prurigo: Secondary | ICD-10-CM

## 2015-08-25 DIAGNOSIS — G4700 Insomnia, unspecified: Secondary | ICD-10-CM

## 2015-08-25 DIAGNOSIS — R109 Unspecified abdominal pain: Secondary | ICD-10-CM

## 2015-08-25 DIAGNOSIS — L239 Allergic contact dermatitis, unspecified cause: Secondary | ICD-10-CM

## 2015-08-25 DIAGNOSIS — F419 Anxiety disorder, unspecified: Secondary | ICD-10-CM

## 2015-08-25 MED ORDER — FLUOXETINE HCL 20 MG PO CAPS: 20.0000 mg | ORAL_CAPSULE | Freq: Every day | ORAL | Status: DC

## 2015-08-25 MED ORDER — TRAZODONE HCL 100 MG PO TABS: 100.0000 mg | ORAL_TABLET | Freq: Every evening | ORAL | Status: DC | PRN

## 2015-08-25 MED ORDER — TIZANIDINE HCL 6 MG PO CAPS: 6.0000 mg | ORAL_CAPSULE | Freq: Three times a day (TID) | ORAL | Status: DC | PRN

## 2015-08-25 MED ORDER — TRAMADOL HCL 50 MG PO TABS: 50.0000 mg | ORAL_TABLET | Freq: Four times a day (QID) | ORAL | Status: DC | PRN

## 2015-08-25 MED ORDER — BUSPIRONE HCL 10 MG PO TABS: ORAL_TABLET | ORAL | Status: DC

## 2015-08-25 MED ORDER — CLONAZEPAM 0.5 MG PO TABS: 0.5000 mg | ORAL_TABLET | Freq: Three times a day (TID) | ORAL | Status: DC | PRN

## 2015-08-25 NOTE — Progress Notes (Signed)
CC: Cynthia Murray is a 45 y.o. female is here for Hospitalization Follow-up; Medication Management; Body itch; and Left side pain   Subjective: HPI:  Hospital follow-up. She tells me that she's not expressing any shortness of breath or focal cord complaints. She denies any fevers, chills or cough. She does have a pain in her left side if she's taking deep breaths or lying down on her left side. It's localized to the left flank is sharp and nonradiating. It is mild in severity and responds nicely to Tylenol however she would like to know if she can restart the old regimen of clonazepam due to a tightness component where the pain is. Pain is also improved heating pad. She denies any genitourinary complaints or gastrointestinal complaints.  She would like a refill on generic Prozac and clonazepam. She's had a tremor in her hands ever since leaving the hospital when she gets anxious. Nothing seems to be setting off her anxiety that she is aware of but she does believe the fluoxetine and clonazepam have helped since that was restarted in the hospital. She denies any depression.  She would like a refill on trazodone. She is taking this nightly to fall asleep. She denies any known side effects or intolerance.  Her major complaint is a rash that began on her flanks that has spread to her distal extremities and back. It's itchy, fine, red and barely raised. It's incredibly itchy and only slightly improved with Benadryl. She denies any shortness of breath, wheezing or flushing. Denies any change in personal care products, medications, or fragrances.  Review Of Systems Outlined In HPI  Past Medical History  Diagnosis Date  . Hypertension     Past Surgical History  Procedure Laterality Date  . Hand surgery  1994    left hand  . Removal of cyst between lungs  1998  . Appendectomy  2004  . Cholecystectomy  2004   Family History  Problem Relation Age of Onset  . Breast cancer      grandmother  .  Heart attack Father   . Depression Mother     father  . Diabetes Father   . Hyperlipidemia Father   . Hypertension Father     Social History   Social History  . Marital Status: Single    Spouse Name: N/A  . Number of Children: N/A  . Years of Education: N/A   Occupational History  . Not on file.   Social History Main Topics  . Smoking status: Current Every Day Smoker -- 1.00 packs/day for 15 years    Types: Cigarettes  . Smokeless tobacco: Not on file  . Alcohol Use: Yes  . Drug Use: No  . Sexual Activity: Not Currently   Other Topics Concern  . Not on file   Social History Narrative     Objective: BP 126/62 mmHg  Wt 247 lb (112.038 kg)  General: Alert and Oriented, No Acute Distress HEENT: Pupils equal, round, reactive to light. Conjunctivae clear.  Moist mucous membranes Lungs: Clear to auscultation bilaterally, no wheezing/ronchi/rales.  Comfortable work of breathing. Good air movement. Cardiac: Regular rate and rhythm. Normal S1/S2.  No murmurs, rubs, nor gallops.   Extremities: No peripheral edema.  Strong peripheral pulses.  Mental Status: No depression, anxiety, nor agitation. Skin: Warm and dry.macular papular rash involving the neck downward sparing the hands and the feet, moderate in severity  Assessment & Plan: Cynthia Murray was seen today for hospitalization follow-up, medication management, body itch and left  side pain.  Diagnoses and all orders for this visit:  Anxiety -     busPIRone (BUSPAR) 10 MG tablet; Take 1 tablet by mouth twice daily  Left flank pain  Insomnia  Allergic dermatitis  Other orders -     clonazePAM (KLONOPIN) 0.5 MG tablet; Take 1-2 tablets (0.5-1 mg total) by mouth 3 (three) times daily as needed (anxiety). -     FLUoxetine (PROZAC) 20 MG capsule; Take 1 capsule (20 mg total) by mouth daily. -     Cancel: methocarbamol (ROBAXIN) 500 MG tablet; Take 1 tablet (500 mg total) by mouth every 6 (six) hours as needed for muscle  spasms. -     traMADol (ULTRAM) 50 MG tablet; Take 1 tablet (50 mg total) by mouth every 6 (six) hours as needed for moderate pain. -     traZODone (DESYREL) 100 MG tablet; Take 1 tablet (100 mg total) by mouth at bedtime as needed for sleep. -     tizanidine (ZANAFLEX) 6 MG capsule; Take 1 capsule (6 mg total) by mouth 3 (three) times daily as needed for muscle spasms.   Anxiety: Controlled with BuSpar, fluoxetine and as needed use of clonazepam. Left flank pain: Most likely due to musculoskeletal strain, suspect intercostal strain therefore continue Tylenol as needed and may begin former regimen of tenacity Insomnia: Controlled with trazodone Allergic dermatitis: Continue as needed Benadryl but received Depo-Medrol today, high suspicion for some form of drug allergy.  40 minutes spent face-to-face during visit today of which at least 50% was counseling or coordinating care regarding: 1. Anxiety   2. Left flank pain   3. Insomnia   4. Allergic dermatitis   and hospital records for sepsis in the setting of pneumonia   Return in about 3 months (around 11/22/2015).

## 2015-08-26 MED ORDER — METHYLPREDNISOLONE ACETATE 80 MG/ML IJ SUSP
80.0000 mg | Freq: Once | INTRAMUSCULAR | Status: AC
  Administered 2015-08-25: 80 mg via INTRAMUSCULAR

## 2015-08-26 NOTE — Addendum Note (Signed)
Addended by: Delrae Alfred on: 08/26/2015 11:44 AM   Modules accepted: Orders

## 2015-08-27 ENCOUNTER — Telehealth: Payer: Self-pay | Admitting: Family Medicine

## 2015-08-27 MED ORDER — PREDNISONE 20 MG PO TABS: ORAL_TABLET | ORAL | Status: AC

## 2015-08-27 NOTE — Telephone Encounter (Signed)
Prednisone sent to requested pharmacy

## 2015-08-27 NOTE — Telephone Encounter (Signed)
Cynthia Murray called into the office today.  She was given a depo shot when she was here on Wednesday.  That helped for about 24 hours. She continues to have the rash, it is red and itchy like it was on Wednesday.  She wants to know if there is anything that can help with the itching.  If you call anything in, please use the [facility] on [address].

## 2015-09-08 ENCOUNTER — Telehealth: Payer: Self-pay

## 2015-09-08 NOTE — Telephone Encounter (Signed)
Pt stated that when she came for her hospital f/u on 08/19/15 you suggested that she don't need to be driving or working.  She is now asking for a work letter explaining that she can't drive or work. Please advise.

## 2015-09-09 ENCOUNTER — Encounter: Payer: Self-pay | Admitting: Family Medicine

## 2015-09-09 NOTE — Telephone Encounter (Signed)
Letter faxed to [phone]

## 2015-09-09 NOTE — Telephone Encounter (Signed)
Letter in your in box.

## 2015-09-16 ENCOUNTER — Telehealth: Payer: Self-pay

## 2015-09-16 MED ORDER — PROPRANOLOL HCL 10 MG PO TABS: 10.0000 mg | ORAL_TABLET | Freq: Three times a day (TID) | ORAL | Status: DC

## 2015-09-16 NOTE — Telephone Encounter (Signed)
Propranolol 53m refill has been sent to [facility].  I believe a month of recovery after being discharged from the hospital is plenty of time to recover, I can't justify any further work restrictions.

## 2015-09-16 NOTE — Telephone Encounter (Signed)
After telling pt her work note can not be extended til April she now asking can it be til the end of March?  Also she is asking for a refill of propranolol 10 mg.  This medication was D/C the last time she was her for an office visit.  Is that refill appropriate?

## 2015-09-16 NOTE — Telephone Encounter (Signed)
Detailed vm left for pt

## 2015-09-21 ENCOUNTER — Encounter: Payer: Self-pay | Admitting: Family Medicine

## 2015-09-21 ENCOUNTER — Ambulatory Visit (INDEPENDENT_AMBULATORY_CARE_PROVIDER_SITE_OTHER): Payer: Self-pay | Admitting: Family Medicine

## 2015-09-21 DIAGNOSIS — Z0289 Encounter for other administrative examinations: Secondary | ICD-10-CM

## 2015-09-21 NOTE — Progress Notes (Signed)
No show 09/21/2015

## 2015-09-22 ENCOUNTER — Telehealth: Payer: Self-pay | Admitting: Family Medicine

## 2015-09-22 MED ORDER — ZOLPIDEM TARTRATE 10 MG PO TABS: 5.0000 mg | ORAL_TABLET | Freq: Every evening | ORAL | Status: DC | PRN

## 2015-09-22 NOTE — Telephone Encounter (Signed)
Dreaming of dead cats while taking trazodone

## 2015-10-02 ENCOUNTER — Other Ambulatory Visit: Payer: Self-pay | Admitting: Family Medicine

## 2015-10-09 ENCOUNTER — Other Ambulatory Visit: Payer: Self-pay | Admitting: Physical Medicine and Rehabilitation

## 2015-11-02 ENCOUNTER — Ambulatory Visit: Payer: Self-pay | Admitting: Family Medicine

## 2015-11-30 ENCOUNTER — Encounter: Payer: Self-pay | Admitting: Family Medicine

## 2015-11-30 ENCOUNTER — Ambulatory Visit (INDEPENDENT_AMBULATORY_CARE_PROVIDER_SITE_OTHER): Payer: Self-pay | Admitting: Family Medicine

## 2015-11-30 VITALS — BP 137/80 | Wt 241.0 lb

## 2015-11-30 DIAGNOSIS — G43809 Other migraine, not intractable, without status migrainosus: Secondary | ICD-10-CM

## 2015-11-30 DIAGNOSIS — G4700 Insomnia, unspecified: Secondary | ICD-10-CM | POA: Insufficient documentation

## 2015-11-30 MED ORDER — TOPIRAMATE 100 MG PO TABS: 100.0000 mg | ORAL_TABLET | Freq: Two times a day (BID) | ORAL | Status: DC

## 2015-11-30 NOTE — Progress Notes (Signed)
CC: Cynthia Murray is a 45 y.o. female is here for Headache   Subjective: HPI:  Follow-up insomnia: Ever since stopping trazodone and switching to Ambien she's no longer having any nightmares about that. She has no difficulty falling asleep or staying asleep when using Ambien.  Her migraines have been more frequent and painful over the past 3 weeks. She used to be on Topamax and once she can try this again. She denies a change in the severity or character of her migraines. It's always involving the left side of the head and is not uncommon to have photophobia. She denies any other motor or sensory disturbances. She denies any awareness of what triggers these headaches. No change to propranolol and the last few weeks. No fevers, chills or visual disturbance. When headache is present pain is 7 out of 10.   Review Of Systems Outlined In HPI  Past Medical History  Diagnosis Date  . Hypertension     Past Surgical History  Procedure Laterality Date  . Hand surgery  1994    left hand  . Removal of cyst between lungs  1998  . Appendectomy  2004  . Cholecystectomy  2004   Family History  Problem Relation Age of Onset  . Breast cancer      grandmother  . Heart attack Father   . Depression Mother     father  . Diabetes Father   . Hyperlipidemia Father   . Hypertension Father     Social History   Social History  . Marital Status: Single    Spouse Name: N/A  . Number of Children: N/A  . Years of Education: N/A   Occupational History  . Not on file.   Social History Main Topics  . Smoking status: Current Every Day Smoker -- 1.00 packs/day for 15 years    Types: Cigarettes  . Smokeless tobacco: Not on file  . Alcohol Use: Yes  . Drug Use: No  . Sexual Activity: Not Currently   Other Topics Concern  . Not on file   Social History Narrative     Objective: Wt 241 lb (109.317 kg)  Vital signs reviewed. General: Alert and Oriented, No Acute Distress HEENT: Pupils equal,  round, reactive to light. Conjunctivae clear.  External ears unremarkable.  Moist mucous membranes. Lungs: Clear and comfortable work of breathing, speaking in full sentences without accessory muscle use. Cardiac: Regular rate and rhythm.  Neuro: CN II-XII grossly intact, gait normal. Extremities: No peripheral edema.  Strong peripheral pulses.  Mental Status: No depression, anxiety, nor agitation. Logical though process. Skin: Warm and dry.  Assessment & Plan: Cynthia Murray was seen today for headache.  Diagnoses and all orders for this visit:  Insomnia -     topiramate (TOPAMAX) 100 MG tablet; Take 1 tablet (100 mg total) by mouth 2 (two) times daily.  Other migraine without status migrainosus, not intractable   Insomnia: Controlled with Ambien Chronic migraines: Controlled chronic condition therefore starting Topamax. She's tolerated this in the past.  Return in about 3 months (around 03/01/2016) for Headache.

## 2015-11-30 NOTE — Addendum Note (Signed)
Addended by: Delrae Alfred on: 11/30/2015 01:46 PM   Modules accepted: Orders, Medications

## 2015-11-30 NOTE — Patient Instructions (Signed)
Menopause Menopause is the normal time of life when menstrual periods stop completely. Menopause is complete when you have missed 12 consecutive menstrual periods. It usually occurs between the ages of 20 years and 63 years. Very rarely does a woman develop menopause before the age of 37 years. At menopause, your ovaries stop producing the female hormones estrogen and progesterone. This can cause undesirable symptoms and also affect your health. Sometimes the symptoms may occur 4-5 years before the menopause begins. There is no relationship between menopause and:  Oral contraceptives.  Number of children you had.  Race.  The age your menstrual periods started (menarche). Heavy smokers and very thin women may develop menopause earlier in life. CAUSES  The ovaries stop producing the female hormones estrogen and progesterone.  Other causes include:  Surgery to remove both ovaries.  The ovaries stop functioning for no known reason.  Tumors of the pituitary gland in the brain.  Medical disease that affects the ovaries and hormone production.  Radiation treatment to the abdomen or pelvis.  Chemotherapy that affects the ovaries. SYMPTOMS   Hot flashes.  Night sweats.  Decrease in sex drive.  Vaginal dryness and thinning of the vagina causing painful intercourse.  Dryness of the skin and developing wrinkles.  Headaches.  Tiredness.  Irritability.  Memory problems.  Weight gain.  Bladder infections.  Hair growth of the face and chest.  Infertility. More serious symptoms include:  Loss of bone (osteoporosis) causing breaks (fractures).  Depression.  Hardening and narrowing of the arteries (atherosclerosis) causing heart attacks and strokes. DIAGNOSIS   When the menstrual periods have stopped for 12 straight months.  Physical exam.  Hormone studies of the blood. TREATMENT  There are many treatment choices and nearly as many questions about them. The  decisions to treat or not to treat menopausal changes is an individual choice made with your health care provider. Your health care provider can discuss the treatments with you. Together, you can decide which treatment will work best for you. Your treatment choices may include:   Hormone therapy (estrogen and progesterone).  Non-hormonal medicines.  Treating the individual symptoms with medicine (for example antidepressants for depression).  Herbal medicines that may help specific symptoms.  Counseling by a psychiatrist or psychologist.  Group therapy.  Lifestyle changes including:  Eating healthy.  Regular exercise.  Limiting caffeine and alcohol.  Stress management and meditation.  No treatment. HOME CARE INSTRUCTIONS   Take the medicine your health care provider gives you as directed.  Get plenty of sleep and rest.  Exercise regularly.  Eat a diet that contains calcium (good for the bones) and soy products (acts like estrogen hormone).  Avoid alcoholic beverages.  Do not smoke.  If you have hot flashes, dress in layers.  Take supplements, calcium, and vitamin D to strengthen bones.  You can use over-the-counter lubricants or moisturizers for vaginal dryness.  Group therapy is sometimes very helpful.  Acupuncture may be helpful in some cases. SEEK MEDICAL CARE IF:   You are not sure you are in menopause.  You are having menopausal symptoms and need advice and treatment.  You are still having menstrual periods after age 39 years.  You have pain with intercourse.  Menopause is complete (no menstrual period for 12 months) and you develop vaginal bleeding.  You need a referral to a specialist (gynecologist, psychiatrist, or psychologist) for treatment. SEEK IMMEDIATE MEDICAL CARE IF:   You have severe depression.  You have excessive vaginal bleeding.  You fell and think you have a broken bone.  You have pain when you urinate.  You develop leg or  chest pain.  You have a fast pounding heart beat (palpitations).  You have severe headaches.  You develop vision problems.  You feel a lump in your breast.  You have abdominal pain or severe indigestion.   This information is not intended to replace advice given to you by your health care provider. Make sure you discuss any questions you have with your health care provider.   Document Released: 09/23/2003 Document Revised: 03/05/2013 Document Reviewed: 01/30/2013 Elsevier Interactive Patient Education [employer].

## 2015-12-19 ENCOUNTER — Other Ambulatory Visit: Payer: Self-pay | Admitting: Family Medicine

## 2015-12-20 ENCOUNTER — Other Ambulatory Visit: Payer: Self-pay | Admitting: Family Medicine

## 2015-12-21 ENCOUNTER — Other Ambulatory Visit: Payer: Self-pay

## 2015-12-21 MED ORDER — PROPRANOLOL HCL 10 MG PO TABS: 10.0000 mg | ORAL_TABLET | Freq: Three times a day (TID) | ORAL | Status: DC

## 2015-12-22 ENCOUNTER — Other Ambulatory Visit: Payer: Self-pay | Admitting: Family Medicine

## 2015-12-28 ENCOUNTER — Other Ambulatory Visit: Payer: Self-pay | Admitting: Physician Assistant

## 2015-12-28 DIAGNOSIS — G43009 Migraine without aura, not intractable, without status migrainosus: Secondary | ICD-10-CM

## 2015-12-28 NOTE — Progress Notes (Signed)
Pt comes in with her father and request referral to neurology. She saw Dr. Ileene Rubens for this recently.

## 2016-01-16 ENCOUNTER — Other Ambulatory Visit: Payer: Self-pay | Admitting: Family Medicine

## 2016-01-21 ENCOUNTER — Other Ambulatory Visit: Payer: Self-pay | Admitting: Family Medicine

## 2016-02-14 ENCOUNTER — Other Ambulatory Visit: Payer: Self-pay | Admitting: Family Medicine

## 2016-02-15 ENCOUNTER — Ambulatory Visit (INDEPENDENT_AMBULATORY_CARE_PROVIDER_SITE_OTHER): Payer: Self-pay

## 2016-02-15 ENCOUNTER — Ambulatory Visit (INDEPENDENT_AMBULATORY_CARE_PROVIDER_SITE_OTHER): Payer: Self-pay | Admitting: Physician Assistant

## 2016-02-15 ENCOUNTER — Encounter: Payer: Self-pay | Admitting: Physician Assistant

## 2016-02-15 VITALS — BP 110/66 | HR 84 | Ht 67.0 in | Wt 243.0 lb

## 2016-02-15 DIAGNOSIS — M2550 Pain in unspecified joint: Secondary | ICD-10-CM

## 2016-02-15 DIAGNOSIS — M25562 Pain in left knee: Secondary | ICD-10-CM

## 2016-02-15 DIAGNOSIS — M25561 Pain in right knee: Secondary | ICD-10-CM

## 2016-02-15 MED ORDER — MELOXICAM 15 MG PO TABS: 15.0000 mg | ORAL_TABLET | Freq: Every day | ORAL | 1 refills | Status: DC

## 2016-02-15 MED ORDER — KETOROLAC TROMETHAMINE 60 MG/2ML IM SOLN
60.0000 mg | Freq: Once | INTRAMUSCULAR | Status: AC
  Administered 2016-02-15: 60 mg via INTRAMUSCULAR

## 2016-02-15 NOTE — Progress Notes (Signed)
   Subjective:    Patient ID: Cynthia Murray, female    DOB: 11/04/1970, 45 y.o.   MRN: [id number]  HPI patient is a 45 year old female who presents to the clinic with multiple joint pain. Patient reports one month of intense joint pain that is spreading all over her body. She reports that pain began suddenly and bilateral knees and since has progressed to bilateral elbows then to bilateral wrist then into bilateral shoulders then into bilateral hands and fingers and then into bilateral ankles finally. Pain is constant in knees and elbows but coming and going and shoulders and ankles. She denies any swelling in the joint but feels like there is heat coming out from her joints and her strength has decreased. She describes the pain as a burning and aching as well as crampy. She rates the pain a 9 out of 10. She denies any injury or trauma. She does not have a family history of autoimmune disorders. Her sister does have psoriasis but she has never had any rashes herself. She does have a friend who had similar symptoms and was diagnosed with rheumatoid arthritis. She is taking 800 mg of ibuprofen every 8 hours with little relief. She did try CBD all with no relief. She is taking vitamin D 2000 mg. The only thing that gives her any relief is hydrocodone that was given to her by neurologist to take as needed for migraine headaches. She has been taking at least one a day.   Review of Systems    see HPI.  Objective:   Physical Exam  Constitutional: She appears well-developed and well-nourished.  Obese.   Cardiovascular: Normal rate, regular rhythm and normal heart sounds.   Musculoskeletal:  Decreased ROM of bilateral shoulders. Not able to abduct past 90 degrees due to pain.  All other joints with Full ROM.  4+ creptius in left knee.  No swelling or redness in joints.   Psychiatric:  Very tearful.           Assessment & Plan:  Multiple joint pain- unclear etiology with such sudden onset and no  trauma. mobic started tomorrow. Toradol 55m IM given in office.  Bilateral knee xrays ordered.  Rheumatoid factor and cyclic antibodies ordered, thyroid antibodies and TSH ordered, ANA and vitamin d.  Continue using her hydrocodone only as needed that she was given by another provider. Will call patient with results.

## 2016-02-16 DIAGNOSIS — M2550 Pain in unspecified joint: Secondary | ICD-10-CM | POA: Insufficient documentation

## 2016-02-16 LAB — THYROID ANTIBODIES
THYROID PEROXIDASE ANTIBODY: 6 [IU]/mL (ref <9)
Thyroglobulin Ab: <1 IU/mL (ref <2)

## 2016-02-16 LAB — COMPLETE METABOLIC PANEL WITH GFR
ALK PHOS: 53 U/L (ref 33–115)
ALT: 13 U/L (ref 6–29)
AST: 14 U/L (ref 10–30)
Albumin: 4.3 g/dL (ref 3.6–5.1)
BILIRUBIN TOTAL: 0.4 mg/dL (ref 0.2–1.2)
BUN: 15 mg/dL (ref 7–25)
CALCIUM: 9.3 mg/dL (ref 8.6–10.2)
CO2: 17 mmol/L — ABNORMAL LOW (ref 20–31)
Chloride: 110 mmol/L (ref 98–110)
Creat: 0.7 mg/dL (ref 0.50–1.10)
GFR, Est African American: >89 mL/min (ref >=60)
Glucose, Bld: 98 mg/dL (ref 65–99)
POTASSIUM: 4 mmol/L (ref 3.5–5.3)
SODIUM: 139 mmol/L (ref 135–146)
Total Protein: 6.6 g/dL (ref 6.1–8.1)

## 2016-02-16 LAB — CYCLIC CITRUL PEPTIDE ANTIBODY, IGG: Cyclic Citrullin Peptide Ab: <16 Units

## 2016-02-16 LAB — RHEUMATOID FACTOR

## 2016-02-16 LAB — VITAMIN D 25 HYDROXY (VIT D DEFICIENCY, FRACTURES): VIT D 25 HYDROXY: 34 ng/mL (ref 30–100)

## 2016-02-16 LAB — TSH: TSH: 3.86 mIU/L

## 2016-02-16 LAB — ANA: ANA: NEGATIVE

## 2016-02-18 ENCOUNTER — Other Ambulatory Visit: Payer: Self-pay | Admitting: Physician Assistant

## 2016-02-18 ENCOUNTER — Other Ambulatory Visit: Payer: Self-pay

## 2016-02-18 MED ORDER — HYDROCODONE-ACETAMINOPHEN 10-325 MG PO TABS: 1.0000 | ORAL_TABLET | Freq: Two times a day (BID) | ORAL | 0 refills | Status: DC

## 2016-02-18 MED ORDER — DULOXETINE HCL 30 MG PO CPEP: 30.0000 mg | ORAL_CAPSULE | Freq: Every day | ORAL | 2 refills | Status: DC

## 2016-02-20 ENCOUNTER — Other Ambulatory Visit: Payer: Self-pay | Admitting: Sports Medicine

## 2016-02-20 ENCOUNTER — Other Ambulatory Visit: Payer: Self-pay | Admitting: Family Medicine

## 2016-02-21 NOTE — Telephone Encounter (Signed)
To PCP

## 2016-03-13 ENCOUNTER — Other Ambulatory Visit: Payer: Self-pay | Admitting: Family Medicine

## 2016-03-22 ENCOUNTER — Encounter: Payer: Self-pay | Admitting: Physician Assistant

## 2016-03-22 ENCOUNTER — Telehealth: Payer: Self-pay | Admitting: *Deleted

## 2016-03-22 ENCOUNTER — Other Ambulatory Visit: Payer: Self-pay | Admitting: Physician Assistant

## 2016-03-22 ENCOUNTER — Other Ambulatory Visit: Payer: Self-pay | Admitting: Family Medicine

## 2016-03-22 DIAGNOSIS — H538 Other visual disturbances: Secondary | ICD-10-CM

## 2016-03-22 DIAGNOSIS — H209 Unspecified iridocyclitis: Secondary | ICD-10-CM | POA: Insufficient documentation

## 2016-03-22 NOTE — Telephone Encounter (Signed)
Labs ordered per request of ophthalmologist.

## 2016-03-23 ENCOUNTER — Ambulatory Visit (INDEPENDENT_AMBULATORY_CARE_PROVIDER_SITE_OTHER): Payer: Self-pay

## 2016-03-23 ENCOUNTER — Other Ambulatory Visit: Payer: Self-pay | Admitting: *Deleted

## 2016-03-23 DIAGNOSIS — F17200 Nicotine dependence, unspecified, uncomplicated: Secondary | ICD-10-CM

## 2016-03-23 DIAGNOSIS — H209 Unspecified iridocyclitis: Secondary | ICD-10-CM

## 2016-03-23 MED ORDER — TRAMADOL HCL 50 MG PO TABS: ORAL_TABLET | ORAL | 0 refills | Status: DC

## 2016-03-23 MED ORDER — PROPRANOLOL HCL 10 MG PO TABS: 10.0000 mg | ORAL_TABLET | Freq: Three times a day (TID) | ORAL | 3 refills | Status: DC

## 2016-03-23 MED ORDER — CLONAZEPAM 1 MG PO TABS: 1.0000 mg | ORAL_TABLET | Freq: Three times a day (TID) | ORAL | 0 refills | Status: DC

## 2016-03-24 LAB — ANTI-NUCLEAR AB-TITER (ANA TITER)

## 2016-03-24 LAB — LYME AB/WESTERN BLOT REFLEX: B burgdorferi Ab IgG+IgM: <0.9 Index (ref <0.90)

## 2016-03-24 LAB — C-REACTIVE PROTEIN

## 2016-03-24 LAB — RPR

## 2016-03-24 LAB — SEDIMENTATION RATE: SED RATE: 7 mm/h (ref 0–20)

## 2016-03-24 LAB — HIV ANTIBODY (ROUTINE TESTING W REFLEX): HIV 1&2 Ab, 4th Generation: NONREACTIVE

## 2016-03-24 LAB — ANA: Anti Nuclear Antibody(ANA): POSITIVE — AB

## 2016-03-24 LAB — ANCA SCREEN W REFLEX TITER: ANCA SCREEN: NEGATIVE

## 2016-03-24 NOTE — Telephone Encounter (Signed)
Pt called. She wants to know her lab results. Also her Opthalmologist needs results for an appointment she has with him on Monday.  She says it is okay to leave results on her vm.  Thank you.

## 2016-03-25 LAB — QUANTIFERON TB GOLD ASSAY (BLOOD)
INTERFERON GAMMA RELEASE ASSAY: NEGATIVE
Mitogen-Nil: >10 IU/mL
QUANTIFERON NIL VALUE: 0.04 [IU]/mL

## 2016-03-28 NOTE — Telephone Encounter (Signed)
Pt given results  

## 2016-03-29 ENCOUNTER — Telehealth: Payer: Self-pay

## 2016-03-29 ENCOUNTER — Other Ambulatory Visit: Payer: Self-pay | Admitting: Physician Assistant

## 2016-03-29 DIAGNOSIS — H209 Unspecified iridocyclitis: Secondary | ICD-10-CM

## 2016-03-29 DIAGNOSIS — R768 Other specified abnormal immunological findings in serum: Secondary | ICD-10-CM | POA: Insufficient documentation

## 2016-03-29 NOTE — Telephone Encounter (Signed)
Let's give the cymbalta just a little more time as long as she is tolerating it.  Rheumatology referral has been made.

## 2016-03-29 NOTE — Telephone Encounter (Signed)
Pt is asking to go back on Prozac. She reports that she doesn't know if the cymbalta is working or not. She also asked about a referral to rheumatology.  I didn't see a referral in her chart. Pt notified that HLA-B27 lab is still pending. Please advise.

## 2016-03-30 NOTE — Telephone Encounter (Signed)
Pt.notified

## 2016-03-31 LAB — HLA-B27 ANTIGEN: DNA Result:: NEGATIVE

## 2016-04-08 ENCOUNTER — Other Ambulatory Visit: Payer: Self-pay | Admitting: Family Medicine

## 2016-04-08 DIAGNOSIS — F419 Anxiety disorder, unspecified: Secondary | ICD-10-CM

## 2016-04-10 ENCOUNTER — Other Ambulatory Visit: Payer: Self-pay | Admitting: *Deleted

## 2016-04-10 DIAGNOSIS — F419 Anxiety disorder, unspecified: Secondary | ICD-10-CM

## 2016-04-10 MED ORDER — BUSPIRONE HCL 10 MG PO TABS: ORAL_TABLET | ORAL | 4 refills | Status: DC

## 2016-04-11 ENCOUNTER — Telehealth: Payer: Self-pay | Admitting: Physician Assistant

## 2016-04-11 NOTE — Telephone Encounter (Signed)
Patient's daughter called and adv that he is out of medicine and would like a refill and change pharmacy from [facility] to [facility] on [address] in [place] Effex Rx 75/150 mgs and Gabapentin he ran out because it was increased and needs to be done with [facility]. Thanks

## 2016-04-11 NOTE — Telephone Encounter (Signed)
All Rxs sent.

## 2016-04-19 ENCOUNTER — Other Ambulatory Visit: Payer: Self-pay | Admitting: Physician Assistant

## 2016-05-02 ENCOUNTER — Other Ambulatory Visit: Payer: Self-pay | Admitting: Physician Assistant

## 2016-05-18 ENCOUNTER — Other Ambulatory Visit: Payer: Self-pay | Admitting: Physician Assistant

## 2016-05-24 ENCOUNTER — Other Ambulatory Visit: Payer: Self-pay | Admitting: *Deleted

## 2016-05-24 MED ORDER — ZOLPIDEM TARTRATE 10 MG PO TABS: ORAL_TABLET | ORAL | 0 refills | Status: DC

## 2016-06-04 ENCOUNTER — Other Ambulatory Visit: Payer: Self-pay | Admitting: Sports Medicine

## 2016-06-12 ENCOUNTER — Other Ambulatory Visit: Payer: Self-pay

## 2016-06-12 ENCOUNTER — Telehealth: Payer: Self-pay

## 2016-06-12 MED ORDER — CLONAZEPAM 1 MG PO TABS: 1.0000 mg | ORAL_TABLET | Freq: Three times a day (TID) | ORAL | 0 refills | Status: DC

## 2016-06-12 NOTE — Telephone Encounter (Signed)
Left message with details of the good rx coupon.  Asked for her to return the call.

## 2016-06-12 NOTE — Telephone Encounter (Signed)
Using good rx cymbalta is 15-26 dollars is this still not affordable. You can print off coupon online.

## 2016-06-13 ENCOUNTER — Other Ambulatory Visit: Payer: Self-pay | Admitting: *Deleted

## 2016-06-13 MED ORDER — FLUOXETINE HCL 20 MG PO CAPS: 20.0000 mg | ORAL_CAPSULE | Freq: Every day | ORAL | 1 refills | Status: DC

## 2016-06-21 ENCOUNTER — Other Ambulatory Visit: Payer: Self-pay | Admitting: Physician Assistant

## 2016-06-21 ENCOUNTER — Other Ambulatory Visit: Payer: Self-pay

## 2016-06-21 MED ORDER — ZOLPIDEM TARTRATE 10 MG PO TABS: ORAL_TABLET | ORAL | 0 refills | Status: DC

## 2016-06-28 ENCOUNTER — Ambulatory Visit (INDEPENDENT_AMBULATORY_CARE_PROVIDER_SITE_OTHER): Payer: Self-pay | Admitting: Sports Medicine

## 2016-06-28 DIAGNOSIS — M25511 Pain in right shoulder: Secondary | ICD-10-CM

## 2016-06-28 DIAGNOSIS — G8929 Other chronic pain: Secondary | ICD-10-CM

## 2016-06-28 MED ORDER — GABAPENTIN 300 MG PO CAPS: ORAL_CAPSULE | ORAL | 3 refills | Status: DC

## 2016-06-28 NOTE — Progress Notes (Signed)
   Subjective:    I'm seeing this patient as a consultation for:  Iran Planas, PA-C  CC: Right shoulder pain  HPI: This is a 45 year old female, she has multiple psychiatric diagnoses. She tells me back in October she fell on her right shoulder, she really didn't have much pain at that time but over the next several months she developed moderate to severe pain over the anterior aspect of her right shoulder, worse with reaching, and overhead activities.  Past medical history:  Negative.  See flowsheet/record as well for more information.  Surgical history: Negative.  See flowsheet/record as well for more information.  Family history: Negative.  See flowsheet/record as well for more information.  Social history: Negative.  See flowsheet/record as well for more information.  Allergies, and medications have been entered into the medical record, reviewed, and no changes needed.   Review of Systems: No headache, visual changes, nausea, vomiting, diarrhea, constipation, dizziness, abdominal pain, skin rash, fevers, chills, night sweats, weight loss, swollen lymph nodes, body aches, joint swelling, muscle aches, chest pain, shortness of breath, mood changes, visual or auditory hallucinations.   Objective:   General: Well Developed, well nourished, and in no acute distress.  Neuro/Psych: Alert and oriented x3, extra-ocular muscles intact, able to move all 4 extremities, sensation grossly intact. Skin: Warm and dry, no rashes noted.  Respiratory: Not using accessory muscles, speaking in full sentences, trachea midline.  Cardiovascular: Pulses palpable, no extremity edema. Abdomen: Does not appear distended. Right Shoulder: Inspection reveals no abnormalities, atrophy or asymmetry. Palpation is normal with no tenderness over AC joint or bicipital groove. ROM is full in all planes. Rotator cuff strength normal throughout. No signs of impingement with negative Neer and Hawkin's tests, empty  can. Speeds and Yergason's tests mildly positive. No labral pathology noted with negative Obrien's, negative crank, negative clunk, and good stability. Normal scapular function observed. No painful arc and no drop arm sign. No apprehension sign The patient was somewhat hyperesthetic to palpation out of proportion to degree of palpation all over.  Impression and Recommendations:   This case required medical decision making of moderate complexity.  Right shoulder pain Symptoms are similar bicipital tendinitis. Formal physical therapy, we are hold off on x-rays for now. She does have significant myofascial-type symptoms, so I'm going to add some gabapentin. Next line return to see me in one month, we will consider biceps tendon sheath injection at no better.

## 2016-06-28 NOTE — Assessment & Plan Note (Signed)
Symptoms are similar bicipital tendinitis. Formal physical therapy, we are hold off on x-rays for now. She does have significant myofascial-type symptoms, so I'm going to add some gabapentin. Next line return to see me in one month, we will consider biceps tendon sheath injection at no better.

## 2016-07-05 ENCOUNTER — Ambulatory Visit: Payer: Self-pay | Admitting: Physical Therapy

## 2016-07-19 ENCOUNTER — Other Ambulatory Visit: Payer: Self-pay | Admitting: Physician Assistant

## 2016-07-20 ENCOUNTER — Other Ambulatory Visit: Payer: Self-pay | Admitting: Physician Assistant

## 2016-08-14 ENCOUNTER — Other Ambulatory Visit: Payer: Self-pay | Admitting: Physician Assistant

## 2016-08-18 ENCOUNTER — Other Ambulatory Visit: Payer: Self-pay | Admitting: Physician Assistant

## 2016-08-29 ENCOUNTER — Other Ambulatory Visit: Payer: Self-pay | Admitting: Sports Medicine

## 2016-08-29 DIAGNOSIS — G8929 Other chronic pain: Secondary | ICD-10-CM

## 2016-08-29 DIAGNOSIS — M25511 Pain in right shoulder: Principal | ICD-10-CM

## 2016-09-07 ENCOUNTER — Other Ambulatory Visit: Payer: Self-pay | Admitting: *Deleted

## 2016-09-07 ENCOUNTER — Other Ambulatory Visit: Payer: Self-pay | Admitting: Physician Assistant

## 2016-09-07 DIAGNOSIS — F419 Anxiety disorder, unspecified: Secondary | ICD-10-CM

## 2016-09-07 MED ORDER — FLUOXETINE HCL 20 MG PO CAPS: 20.0000 mg | ORAL_CAPSULE | Freq: Every day | ORAL | 0 refills | Status: DC

## 2016-09-20 ENCOUNTER — Ambulatory Visit (INDEPENDENT_AMBULATORY_CARE_PROVIDER_SITE_OTHER): Payer: Self-pay | Admitting: Physician Assistant

## 2016-09-20 ENCOUNTER — Encounter: Payer: Self-pay | Admitting: Physician Assistant

## 2016-09-20 VITALS — BP 120/79 | HR 86 | Ht 67.0 in | Wt 217.0 lb

## 2016-09-20 DIAGNOSIS — M25511 Pain in right shoulder: Secondary | ICD-10-CM

## 2016-09-20 DIAGNOSIS — I10 Essential (primary) hypertension: Secondary | ICD-10-CM

## 2016-09-20 DIAGNOSIS — G43009 Migraine without aura, not intractable, without status migrainosus: Secondary | ICD-10-CM

## 2016-09-20 DIAGNOSIS — G8929 Other chronic pain: Secondary | ICD-10-CM

## 2016-09-20 DIAGNOSIS — F419 Anxiety disorder, unspecified: Secondary | ICD-10-CM

## 2016-09-20 DIAGNOSIS — F331 Major depressive disorder, recurrent, moderate: Secondary | ICD-10-CM

## 2016-09-20 DIAGNOSIS — F5101 Primary insomnia: Secondary | ICD-10-CM

## 2016-09-20 MED ORDER — TOPIRAMATE 100 MG PO TABS: 100.0000 mg | ORAL_TABLET | Freq: Two times a day (BID) | ORAL | 1 refills | Status: AC

## 2016-09-20 MED ORDER — BUSPIRONE HCL 10 MG PO TABS: 10.0000 mg | ORAL_TABLET | Freq: Two times a day (BID) | ORAL | 1 refills | Status: AC

## 2016-09-20 MED ORDER — GABAPENTIN 800 MG PO TABS: 800.0000 mg | ORAL_TABLET | Freq: Three times a day (TID) | ORAL | 1 refills | Status: AC

## 2016-09-20 MED ORDER — CLONAZEPAM 1 MG PO TABS: 1.0000 mg | ORAL_TABLET | Freq: Three times a day (TID) | ORAL | 0 refills | Status: AC

## 2016-09-20 MED ORDER — ZOLPIDEM TARTRATE 10 MG PO TABS: ORAL_TABLET | ORAL | 5 refills | Status: AC

## 2016-09-20 MED ORDER — FLUOXETINE HCL 20 MG PO CAPS: 20.0000 mg | ORAL_CAPSULE | Freq: Every day | ORAL | 0 refills | Status: DC

## 2016-09-20 MED ORDER — TIZANIDINE HCL 6 MG PO CAPS: 6.0000 mg | ORAL_CAPSULE | Freq: Three times a day (TID) | ORAL | 1 refills | Status: DC | PRN

## 2016-09-20 MED ORDER — FLUOXETINE HCL 40 MG PO CAPS: 40.0000 mg | ORAL_CAPSULE | Freq: Every day | ORAL | 1 refills | Status: AC

## 2016-09-20 MED ORDER — TRAMADOL HCL 50 MG PO TABS: ORAL_TABLET | ORAL | 0 refills | Status: AC

## 2016-09-20 MED ORDER — TIZANIDINE HCL 4 MG PO TABS: 6.0000 mg | ORAL_TABLET | Freq: Three times a day (TID) | ORAL | 1 refills | Status: AC | PRN

## 2016-09-20 MED ORDER — PROPRANOLOL HCL 10 MG PO TABS: 10.0000 mg | ORAL_TABLET | Freq: Three times a day (TID) | ORAL | 1 refills | Status: AC

## 2016-09-21 DIAGNOSIS — H209 Unspecified iridocyclitis: Secondary | ICD-10-CM | POA: Insufficient documentation

## 2016-09-21 DIAGNOSIS — H538 Other visual disturbances: Secondary | ICD-10-CM | POA: Insufficient documentation

## 2016-09-22 NOTE — Progress Notes (Signed)
   Subjective:    Patient ID: Cynthia Murray, female    DOB: [birth year], 46 y.o.   MRN: [id number]  HPI Pt comes in today to get refills preparing for move to [place].   She does feel like her depression has worsened. She would like increase on prozac. She hopes will improved after she gets settled. She request 2 months supply of klonapin so that she has some time to get another provider.    Review of Systems  All other systems reviewed and are negative.      Objective:   Physical Exam  Constitutional: She is oriented to person, place, and time. She appears well-developed and well-nourished.  HENT:  Head: Normocephalic and atraumatic.  Cardiovascular: Normal rate, regular rhythm and normal heart sounds.   Pulmonary/Chest: Effort normal and breath sounds normal.  Neurological: She is alert and oriented to person, place, and time.  Psychiatric: Her behavior is normal.  Tearful.           Assessment & Plan:  Marland KitchenMarland KitchenDiagnoses and all orders for this visit:  Migraine without aura and without status migrainosus, not intractable -     topiramate (TOPAMAX) 100 MG tablet; Take 1 tablet (100 mg total) by mouth 2 (two) times daily. -     propranolol (INDERAL) 10 MG tablet; Take 1 tablet (10 mg total) by mouth 3 (three) times daily.  Primary insomnia -     zolpidem (AMBIEN) 10 MG tablet; TAKE ONE-HALF TO ONE TABLET BY MOUTH AT BEDTIME AS NEEDED FOR SLEEP  Chronic right shoulder pain -     traMADol (ULTRAM) 50 MG tablet; TAKE ONE TABLET BY MOUTH EVERY 6 HOURS AS NEEDED FOR  MODERATE  PAIN -     gabapentin (NEURONTIN) 800 MG tablet; Take 1 tablet (800 mg total) by mouth 3 (three) times daily. -     tiZANidine (ZANAFLEX) 4 MG tablet; Take 1.5 tablets (6 mg total) by mouth every 8 (eight) hours as needed for muscle spasms.  Anxiety -     clonazePAM (KLONOPIN) 1 MG tablet; Take 1 tablet (1 mg total) by mouth 3 (three) times daily. -     busPIRone (BUSPAR) 10 MG tablet; Take 1 tablet (10 mg  total) by mouth 2 (two) times daily.  Essential hypertension, benign  Moderate episode of recurrent major depressive disorder (HCC) -     FLUoxetine (PROZAC) 40 MG capsule; Take 1 capsule (40 mg total) by mouth daily.  Other orders -     Discontinue: tizanidine (ZANAFLEX) 6 MG capsule; Take 1 capsule (6 mg total) by mouth 3 (three) times daily as needed for muscle spasms. -     Discontinue: FLUoxetine (PROZAC) 20 MG capsule; Take 1 capsule (20 mg total) by mouth daily. Need a f/u appt   Increased prozac to 33m.  Gave 2 months 180 tablets so that she has some time to establish care.

## 2017-04-26 IMAGING — CT CT HEAD W/O CM
2 series · 16 of 30 positions shown, 20 images · non-contrast
Comparison: None.

CLINICAL DATA: Headache for the last 5 days. Pneumonia. Left-sided
lung mass.

EXAM:
CT HEAD WITHOUT CONTRAST
TECHNIQUE: Contiguous axial images were obtained from the base of the skull
through the vertex without intravenous contrast.

[Series 201: head w/o, idose (1) · axial · non-contrast · 0.43mm/px · z∈[+46,+176]mm · 13 of 32 slices shown, 17 images]
[im 3/32  brain]
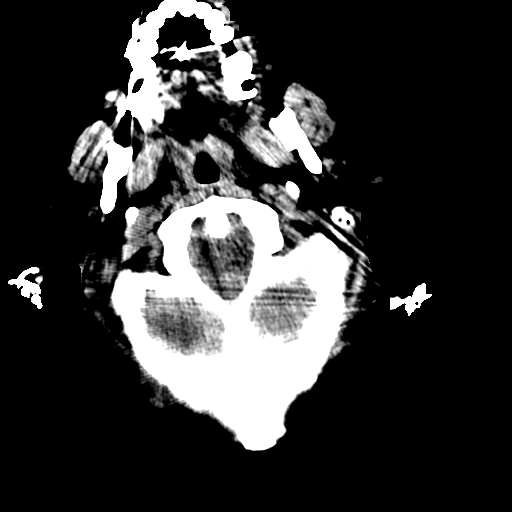
[im 3/32  bone]
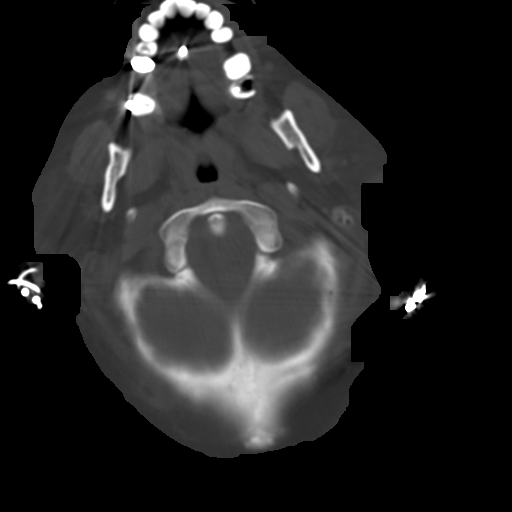
[im 5/32  brain]
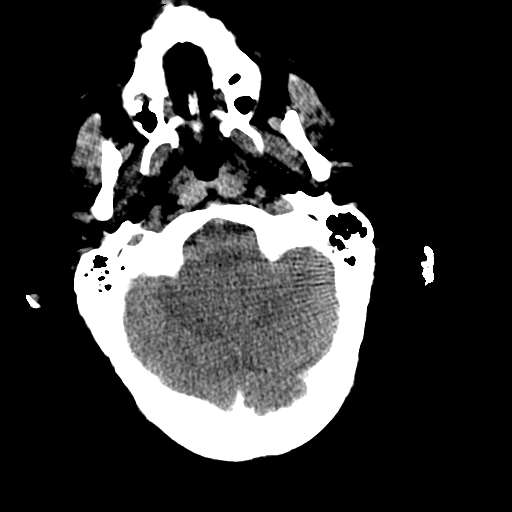
[im 7/32  brain]
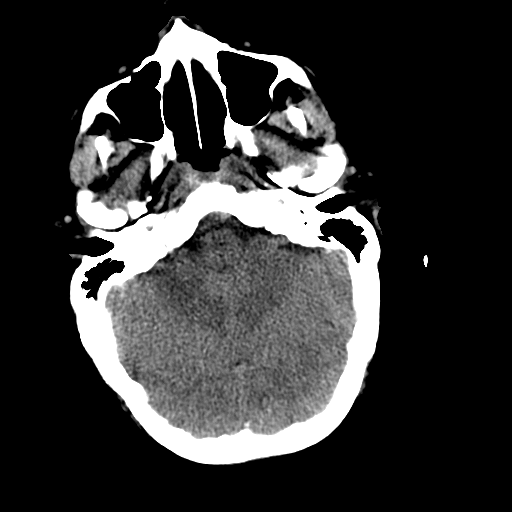
[im 9/32  brain]
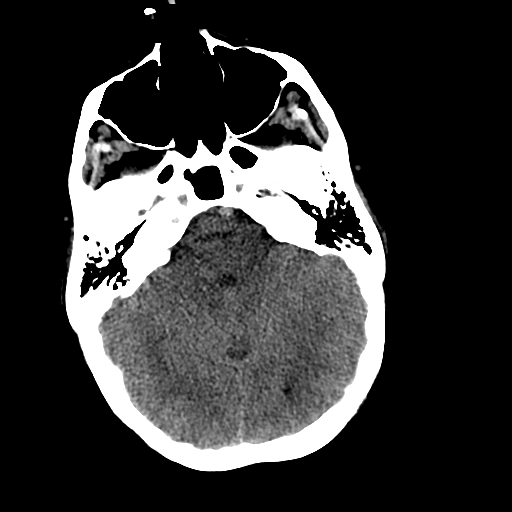
[im 12/32  brain]
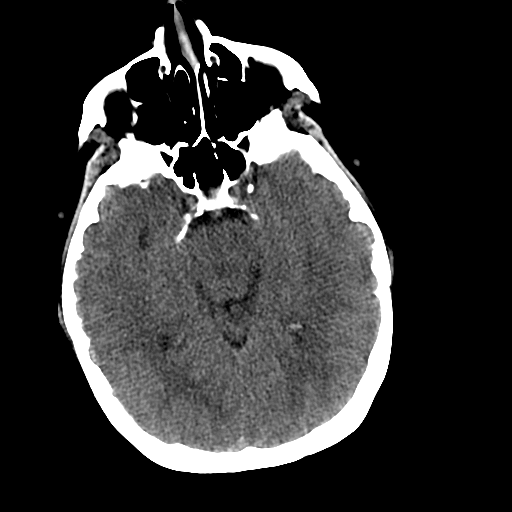
[im 12/32  bone]
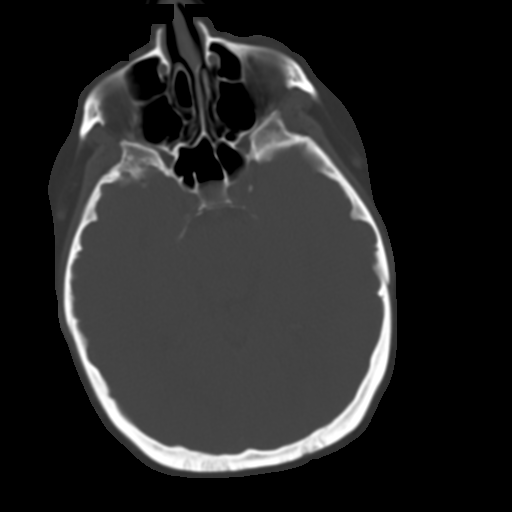
[im 14/32  brain]
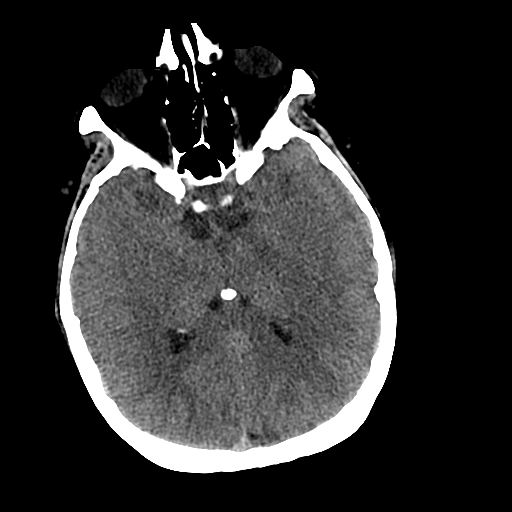
[im 16/32  brain]
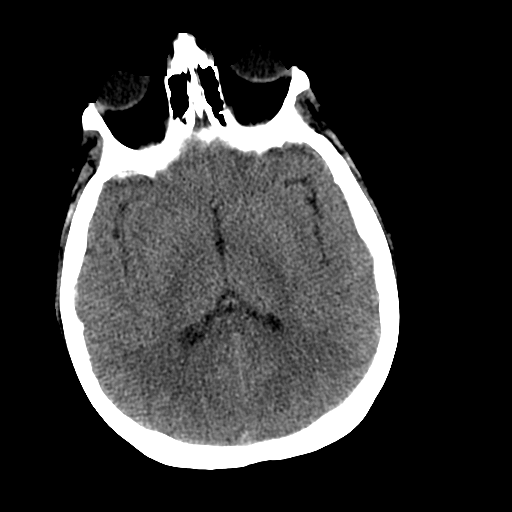
[im 18/32  brain]
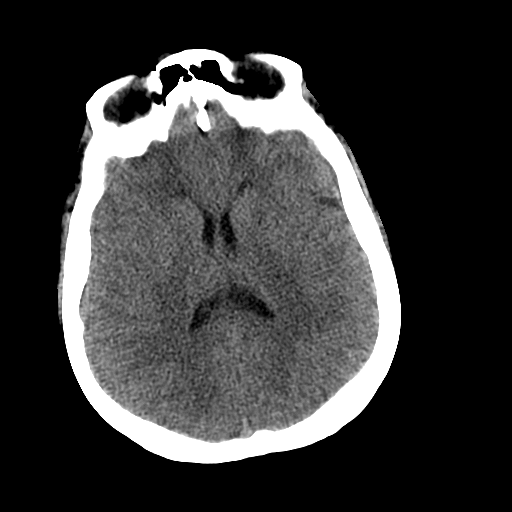
[im 20/32  brain]
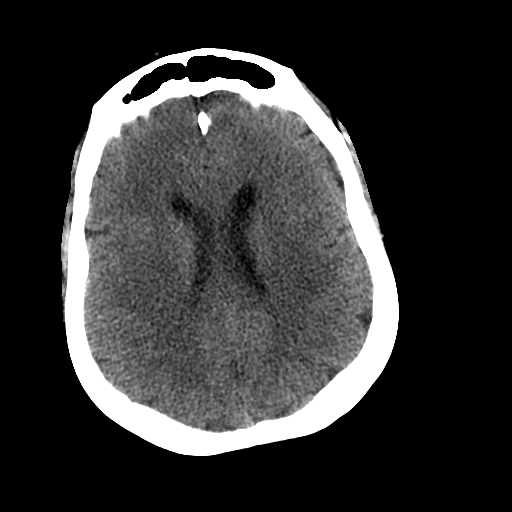
[im 20/32  bone]
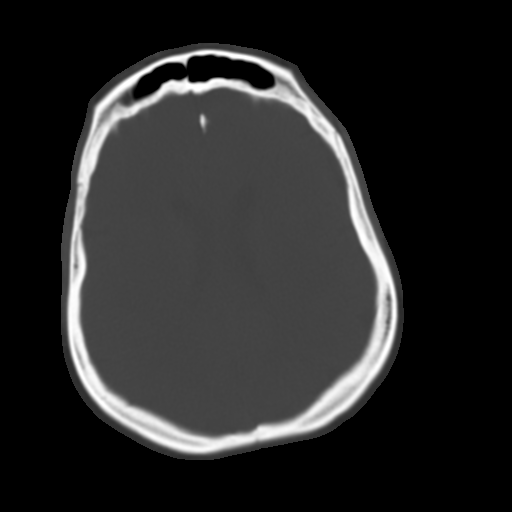
[im 23/32  brain]
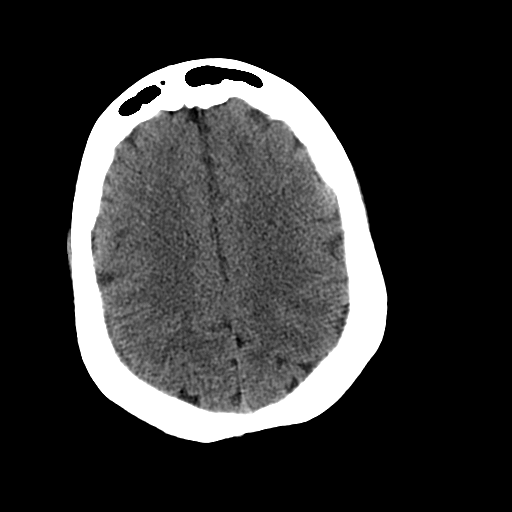
[im 25/32  brain]
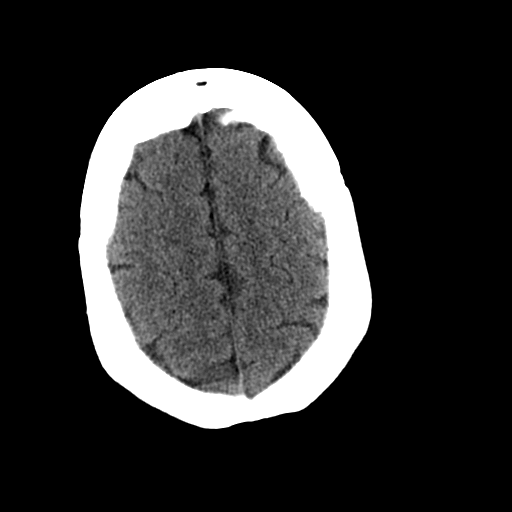
[im 27/32  brain]
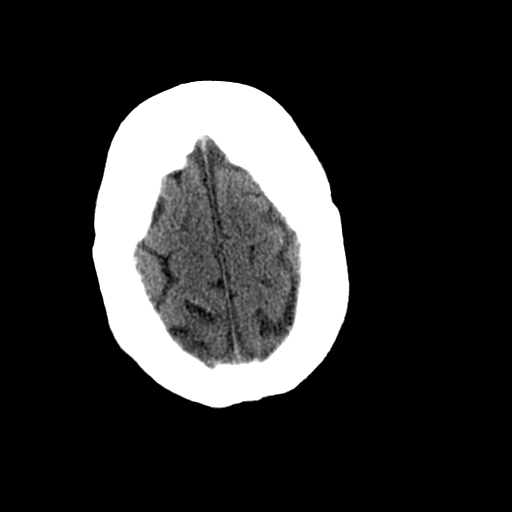
[im 29/32  brain]
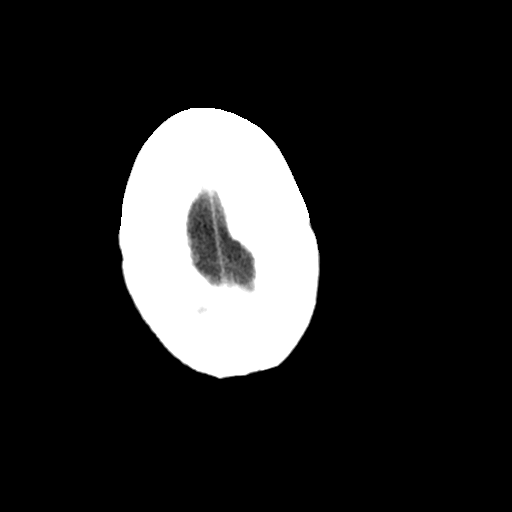
[im 29/32  bone]
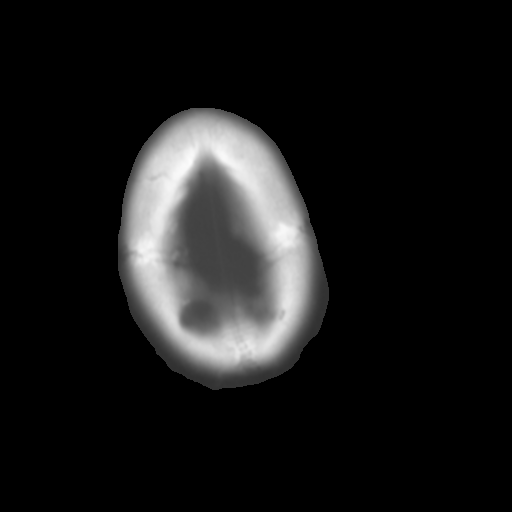

[Series 202: head w/o bone, idose (1) · axial · non-contrast · 0.43mm/px · z∈[+46,+91]mm · 3 of 32 slices shown]
[im 3/32  bone]
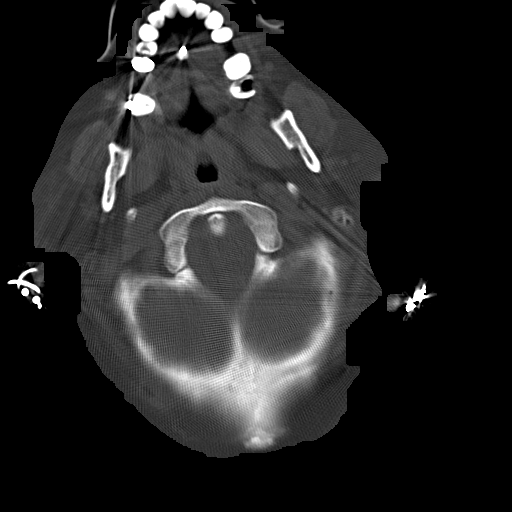
[im 7/32  bone]
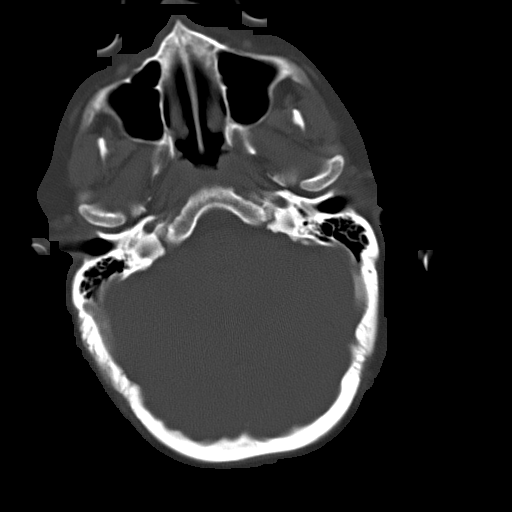
[im 12/32  bone]
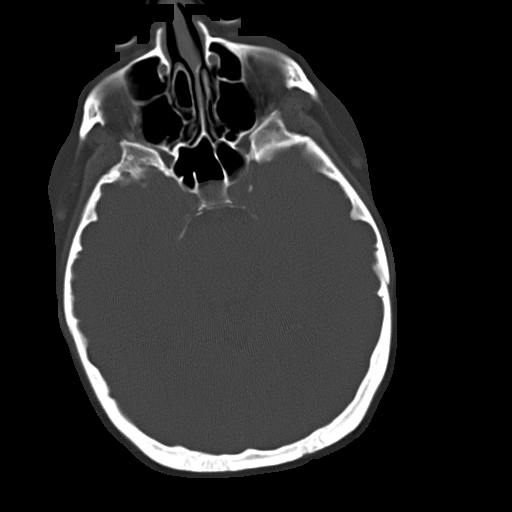

[16 of 30 positions shown; findings below may reference images not displayed]

FINDINGS: Sinuses/Soft tissues: Clear paranasal sinuses and mastoid air cells.

Intracranial: No mass lesion, hemorrhage, hydrocephalus, acute
infarct, intra-axial, or extra-axial fluid collection.
IMPRESSION: Normal head CT.

## 2017-04-26 IMAGING — CR DG CHEST 1V PORT
1 series · 1 of 1 positions shown · non-contrast
Comparison: Chest CT earlier same day

CLINICAL DATA: Patient status post ET tube placement.

EXAM:
PORTABLE CHEST 1 VIEW

[AP]
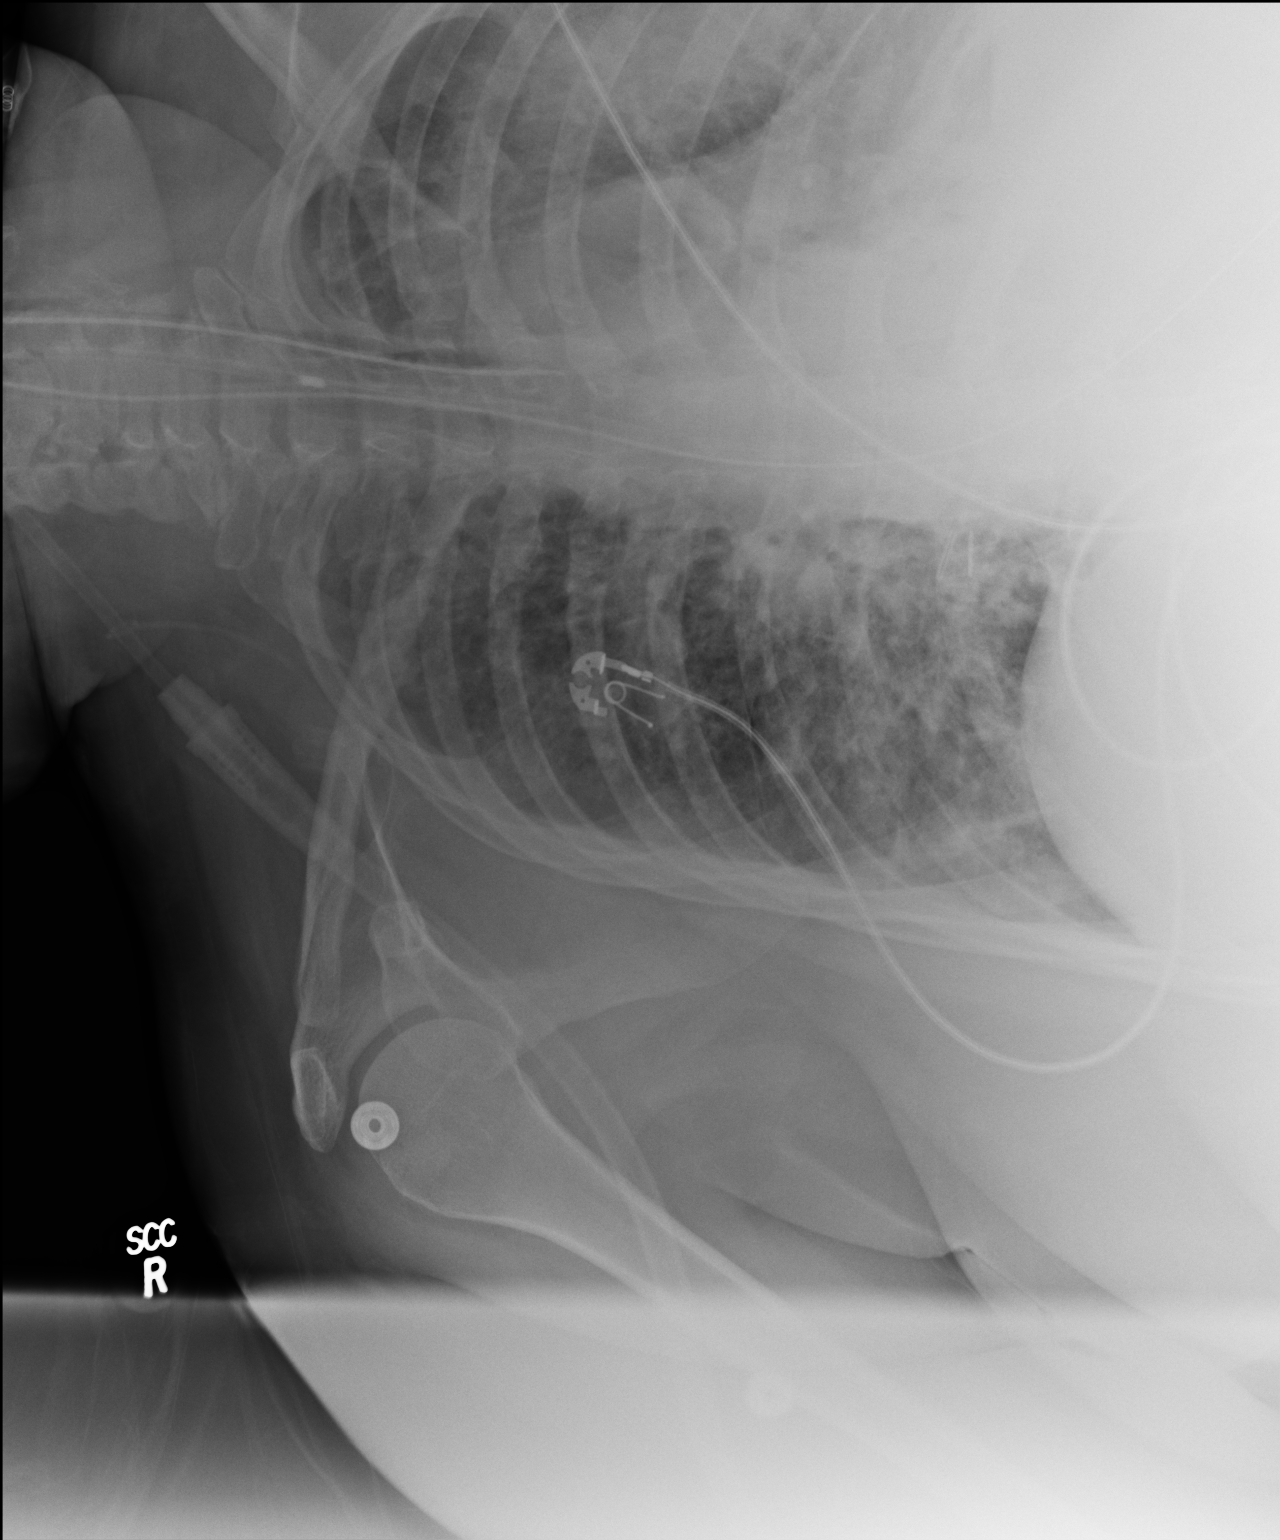

[1 of 1 positions shown; findings below may reference images not displayed]

FINDINGS: ET tube terminates in the distal trachea. Enteric tube courses
inferior to the diaphragm. Obscured cardiac and mediastinal
contours. Diffuse left-greater-than-right pulmonary airspace
opacities. No large pleural effusion or pneumothorax.
IMPRESSION: ET tube terminates in the distal trachea, recommend retraction.

Left-greater-than-right airspace opacities compatible with extensive
pneumonia.

These results were called by telephone at the time of interpretation
on 07/17/2015 at [DATE] to [REDACTED], RN, who verbally
acknowledged these results.
